# Patient Record
Sex: Male | Born: 1970 | Race: White | Hispanic: No | Marital: Single | Smoking: Never smoker
Health system: Southern US, Community
[De-identification: ages and names within clinical notes are randomized; demographics above are authoritative.]

## PROBLEM LIST (undated history)

## (undated) DIAGNOSIS — I10 Essential (primary) hypertension: Secondary | ICD-10-CM

## (undated) DIAGNOSIS — N289 Disorder of kidney and ureter, unspecified: Secondary | ICD-10-CM

## (undated) DIAGNOSIS — I509 Heart failure, unspecified: Secondary | ICD-10-CM

---

## 2006-11-01 ENCOUNTER — Emergency Department (HOSPITAL_COMMUNITY): Admission: EM | Admit: 2006-11-01 | Discharge: 2006-11-02 | Payer: Self-pay | Admitting: Emergency Medicine

## 2006-11-03 ENCOUNTER — Inpatient Hospital Stay: Payer: Self-pay | Admitting: *Deleted

## 2006-12-07 ENCOUNTER — Ambulatory Visit: Payer: Self-pay | Admitting: Internal Medicine

## 2007-02-25 ENCOUNTER — Ambulatory Visit: Payer: Self-pay | Admitting: Internal Medicine

## 2007-03-28 ENCOUNTER — Ambulatory Visit: Payer: Self-pay | Admitting: Internal Medicine

## 2008-09-05 ENCOUNTER — Emergency Department: Payer: Self-pay | Admitting: Emergency Medicine

## 2009-01-05 IMAGING — CT CT CHEST W/ CM
1 series · 16 of 32 positions shown, 20 images · non-contrast
Comparison: none

REASON FOR EXAM: chest pain high d dimer   [HOSPITAL]
COMMENTS:

[Series 4: soft tissue · axial · 0.69mm/px · z∈[-356,-83]mm · 16 of 99 slices shown, 20 images]
[im 4/99  mediastinal]
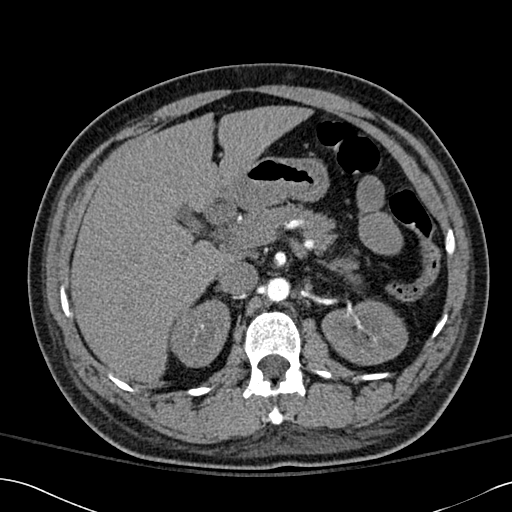
[im 4/99  lung]
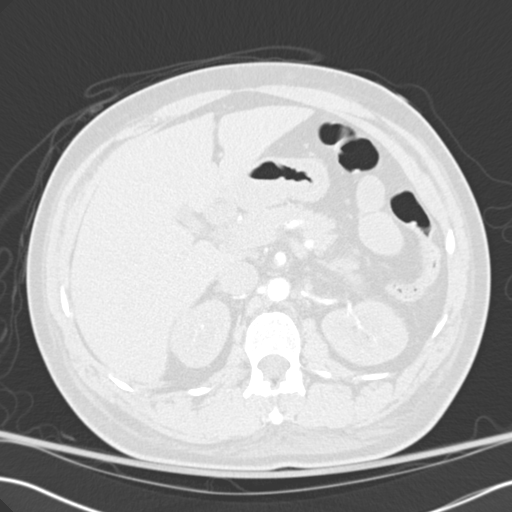
[im 11/99  lung]
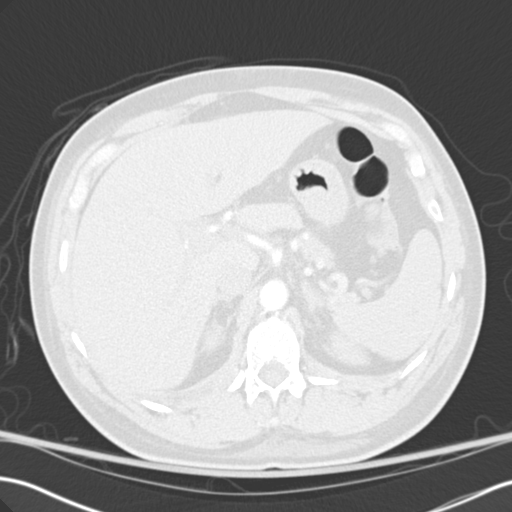
[im 19/99  lung]
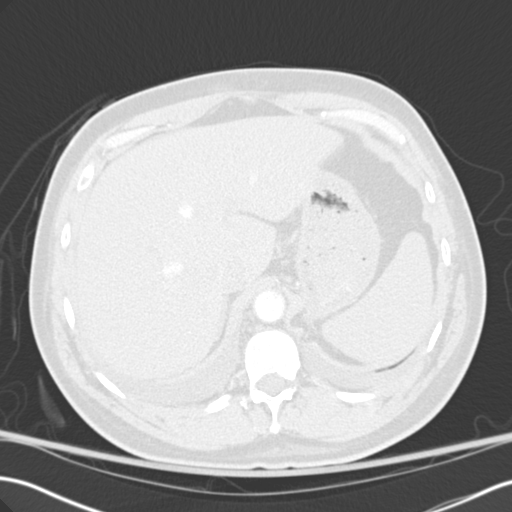
[im 22/99  lung]
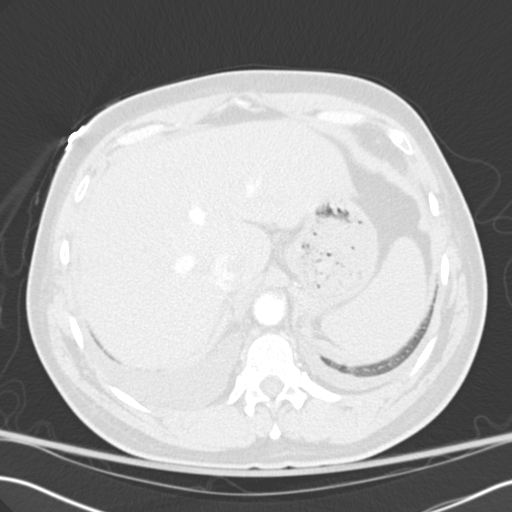
[im 30/99  mediastinal]
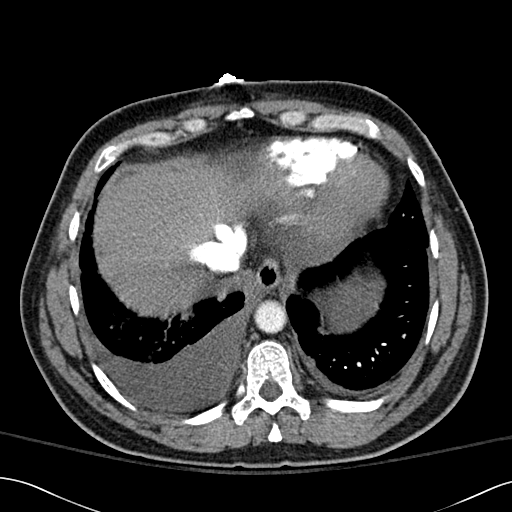
[im 30/99  lung]
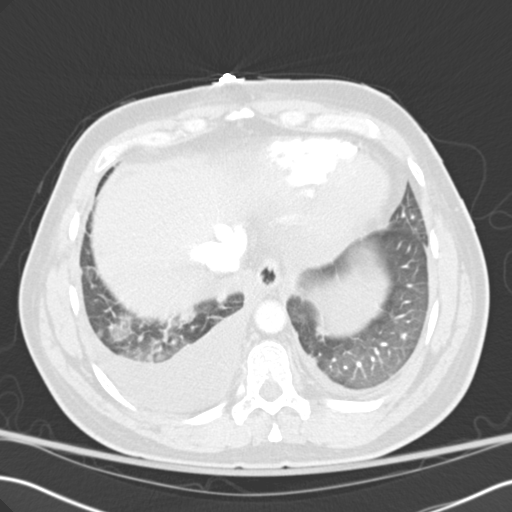
[im 37/99  lung]
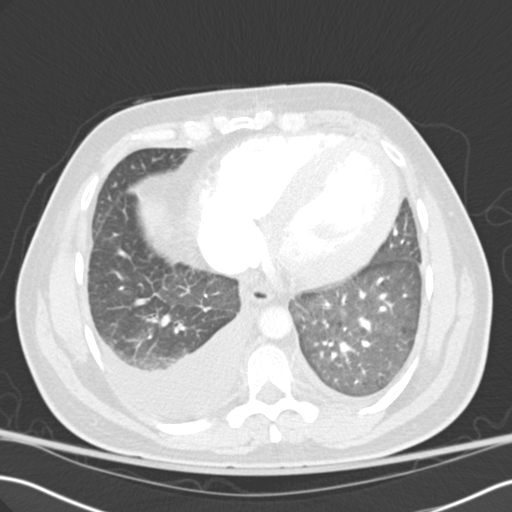
[im 44/99  lung]
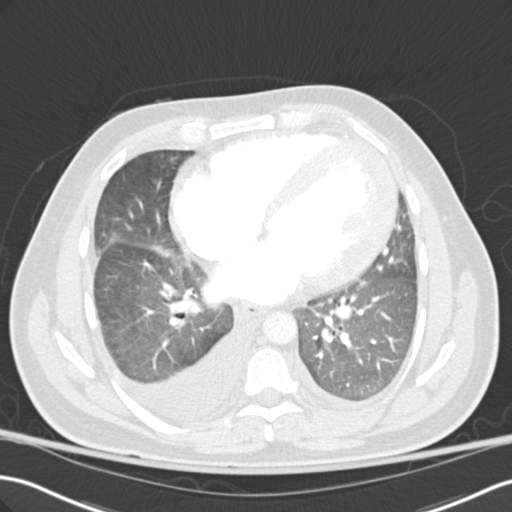
[im 48/99  lung]
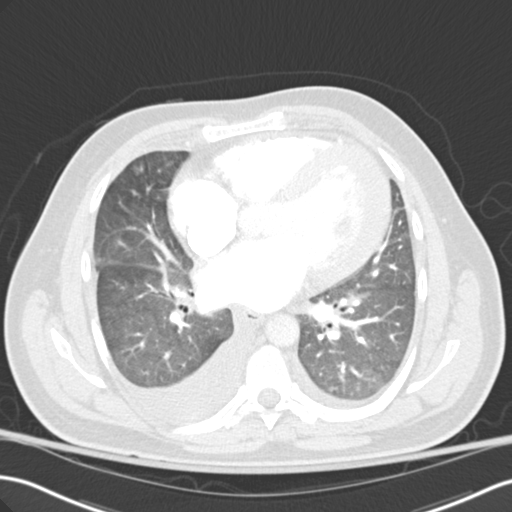
[im 51/99  mediastinal]
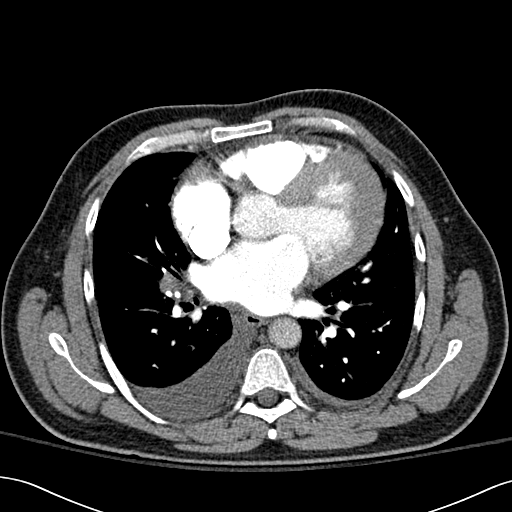
[im 51/99  lung]
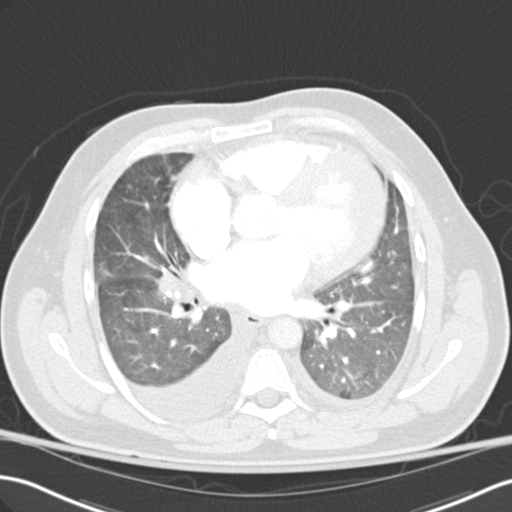
[im 55/99  lung]
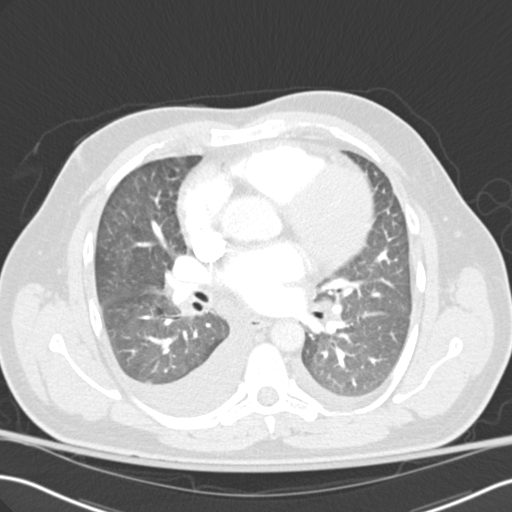
[im 62/99  lung]
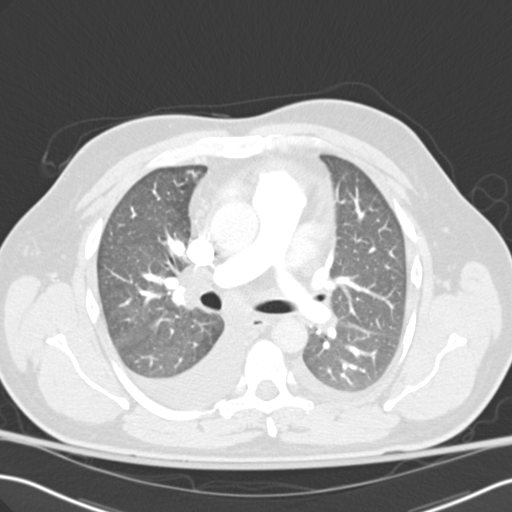
[im 69/99  lung]
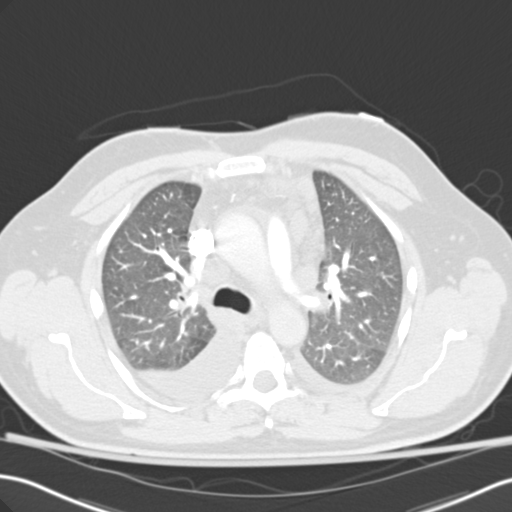
[im 77/99  mediastinal]
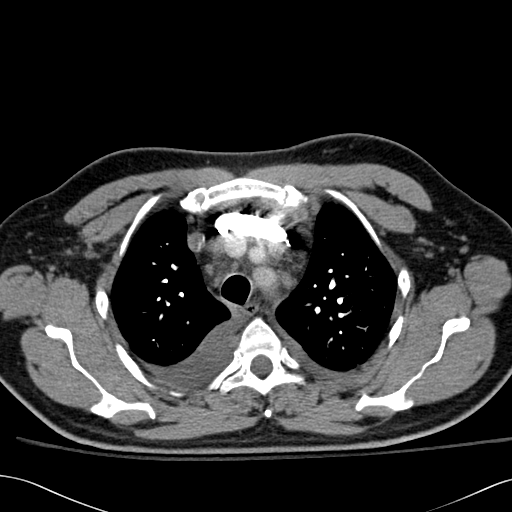
[im 77/99  lung]
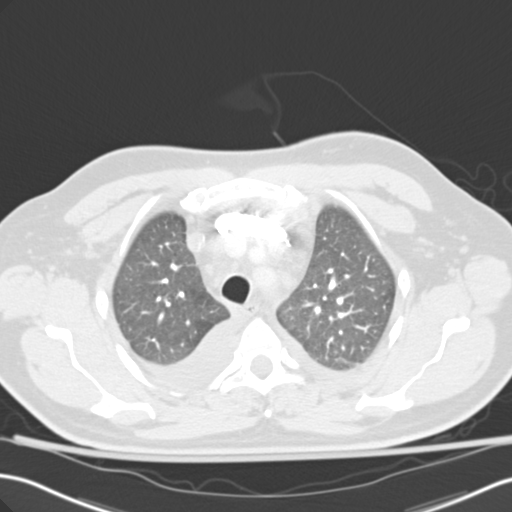
[im 80/99  lung]
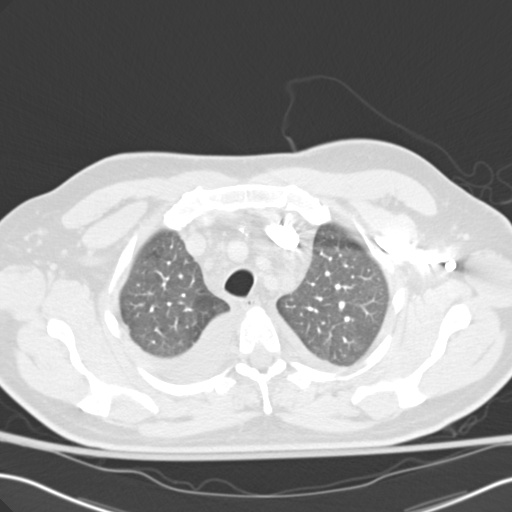
[im 88/99  lung]
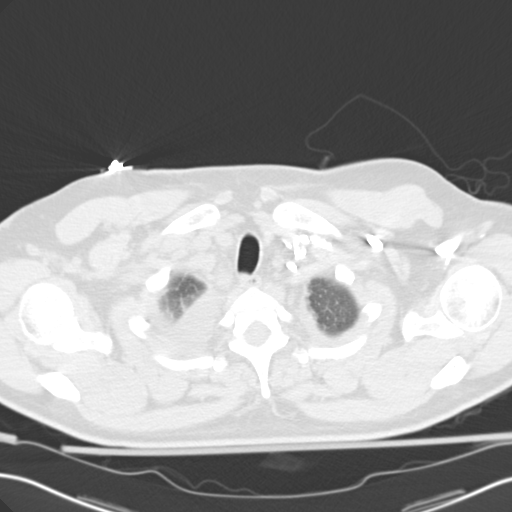
[im 95/99  lung]
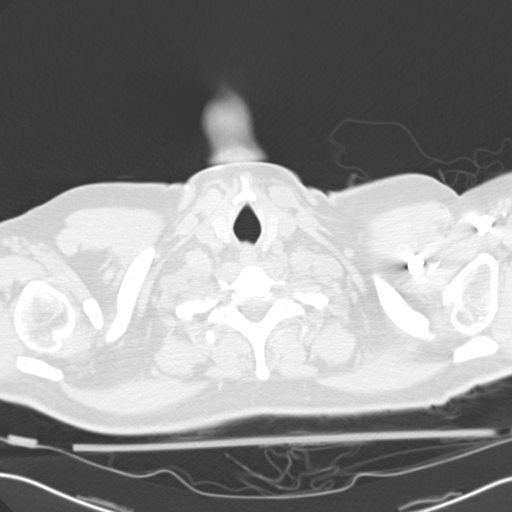

[16 of 32 positions shown; findings below may reference images not displayed]

PROCEDURE:     CT  - CT CHEST (FOR PE) W  - November 03, 2006 [DATE]

RESULT:     An enhanced emergent CT was performed for chest pain and
elevated D.dimer.  The report was faxed to the Emergency Room.

There is a good bolus of contrast in the pulmonary arteries with no filling
defects to suggest pulmonary emboli.

Bilateral pleural effusions are noted greater on the RIGHT than the LEFT.

On the mediastinal window settings a multitude of lymph nodes are noted in
the upper mediastinum pretracheal area as well as bilateral hilar lymph
nodes. On the lung window settings the lung fields appear clear.
IMPRESSION: 1)No evidence of pulmonary embolus.

2)Bilateral effusions with mediastinal and hilar lymphadenopathy. Underlying
neoplasm and lymphoma cannot be excluded.

## 2009-12-28 ENCOUNTER — Ambulatory Visit: Payer: Self-pay | Admitting: Nephrology

## 2010-11-08 IMAGING — US US PELVIS LIMITED
1 series · 17 of 25 positions shown · non-contrast
Comparison: No comparisons

REASON FOR EXAM: pain and urinary retention history of hypospadius
COMMENTS:   LMP: (Male)

PROCEDURE:     US  - US TESTICULAR  - September 05, 2008  [DATE]
RESULT:     Indication: Pain
TECHNIQUE: Multiple gray-scale, color-flow Doppler, and spectral waveform
tracings of the testicles and testicular vasculature are presented for
review.

[Series 1: us pelvis limited · 17 of 37 slices shown]
[im 1/37]
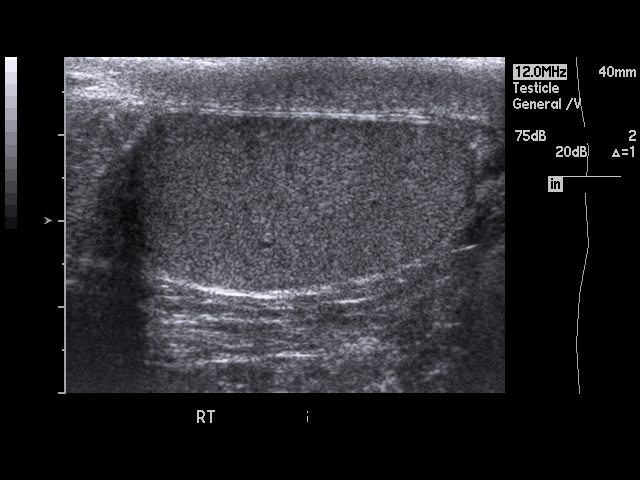
[im 4/37]
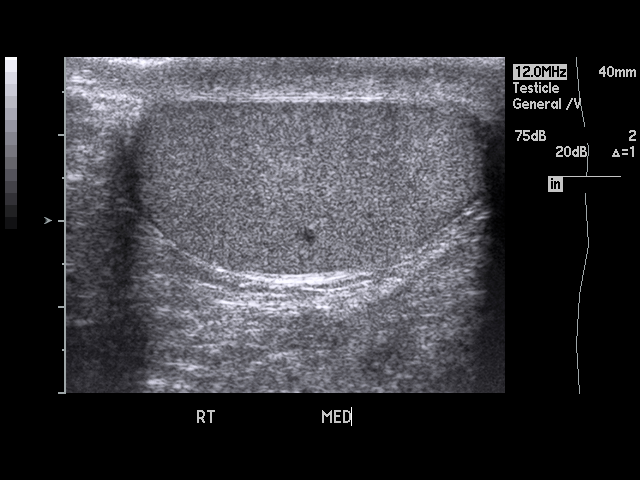
[im 5/37]
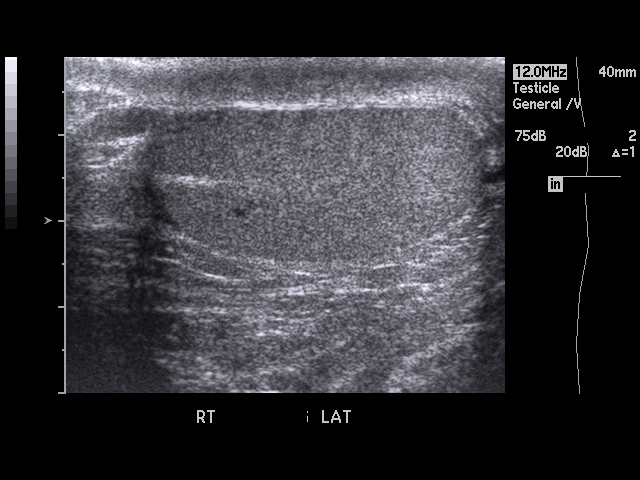
[im 8/37]
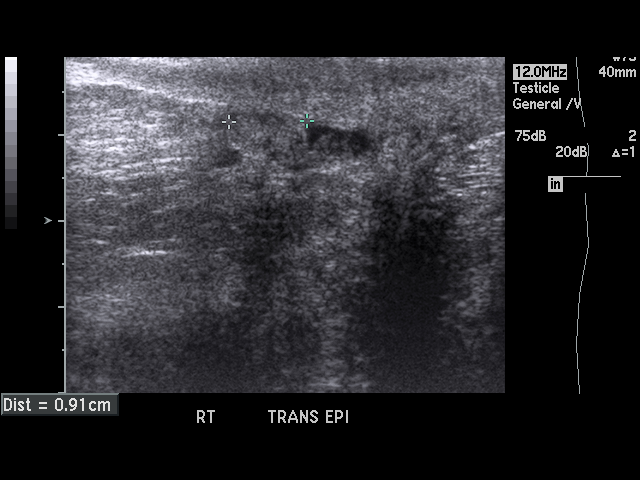
[im 10/37]
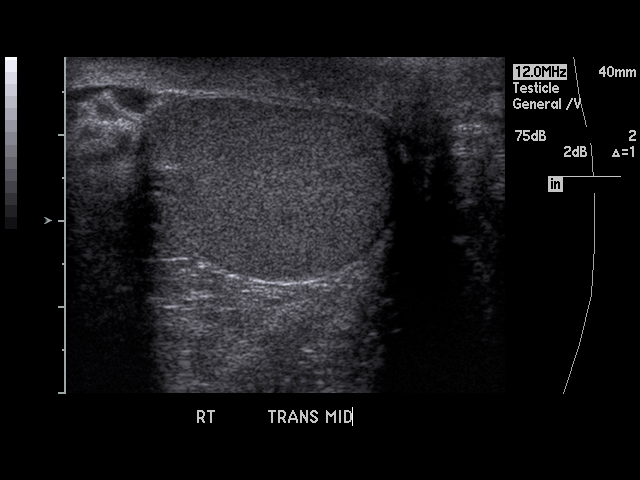
[im 13/37]
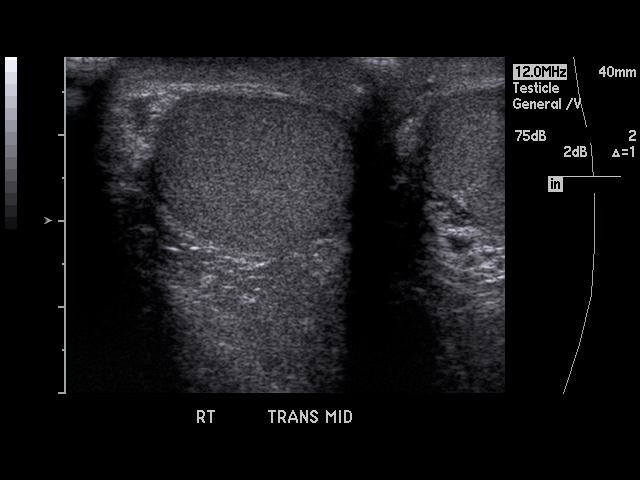
[im 14/37]
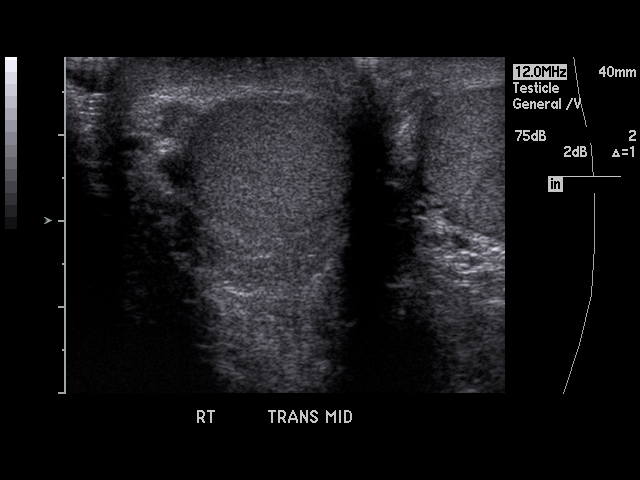
[im 17/37]
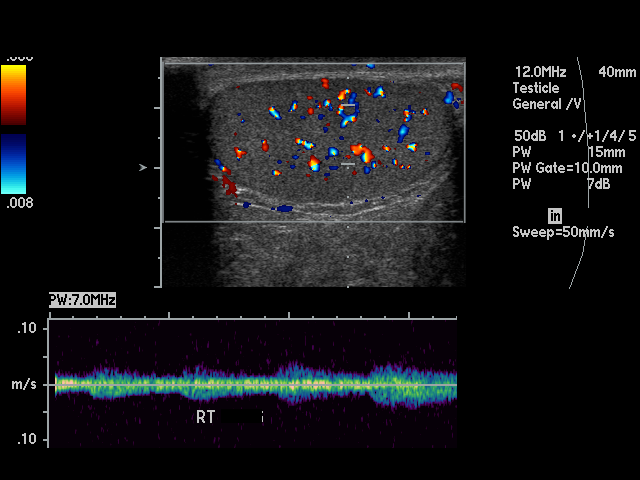
[im 19/37]
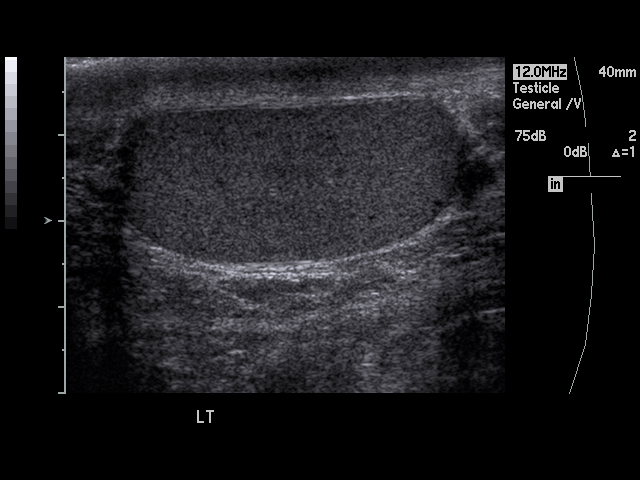
[im 20/37]
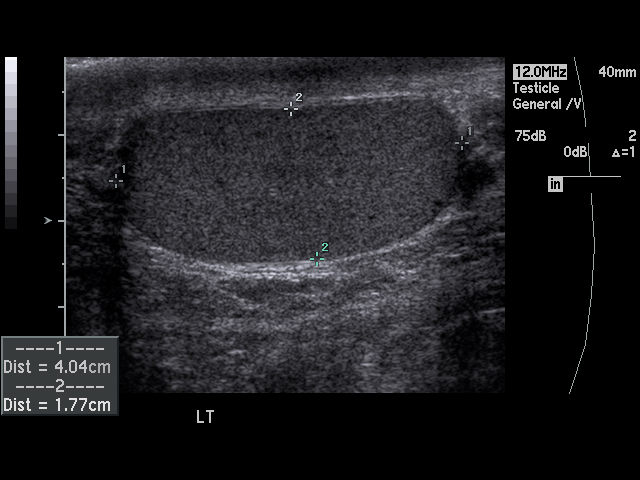
[im 23/37]
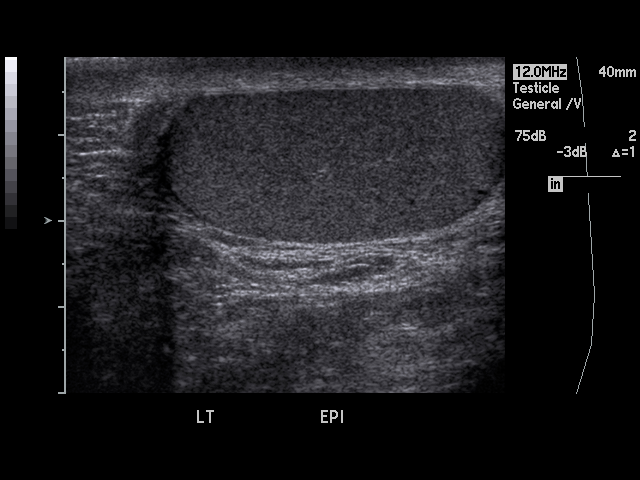
[im 25/37]
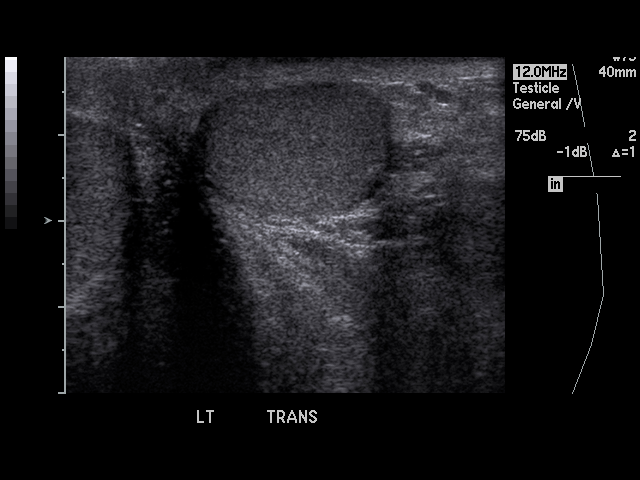
[im 28/37]
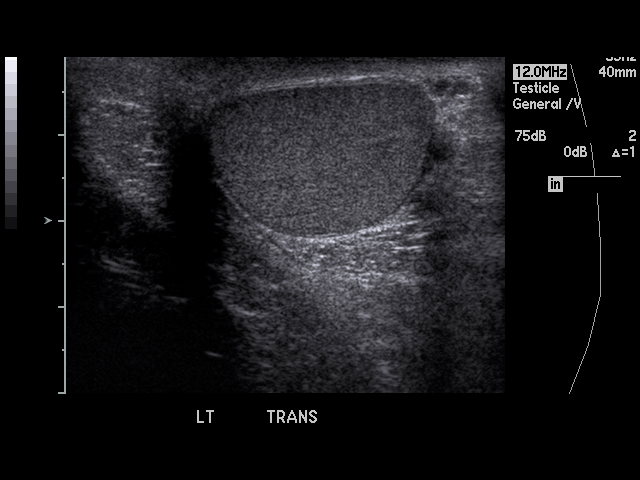
[im 29/37]
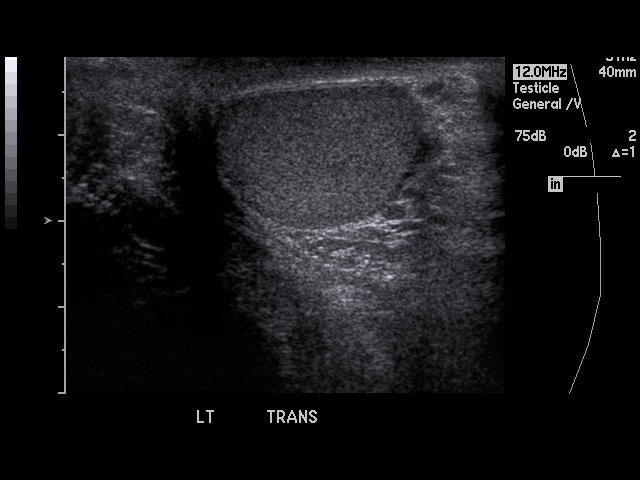
[im 32/37]
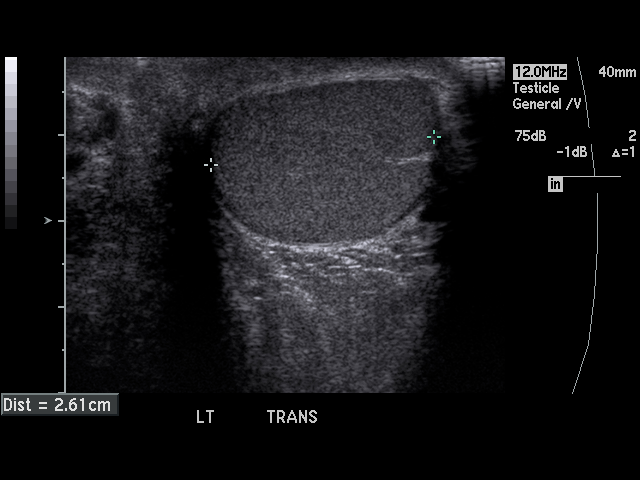
[im 34/37]
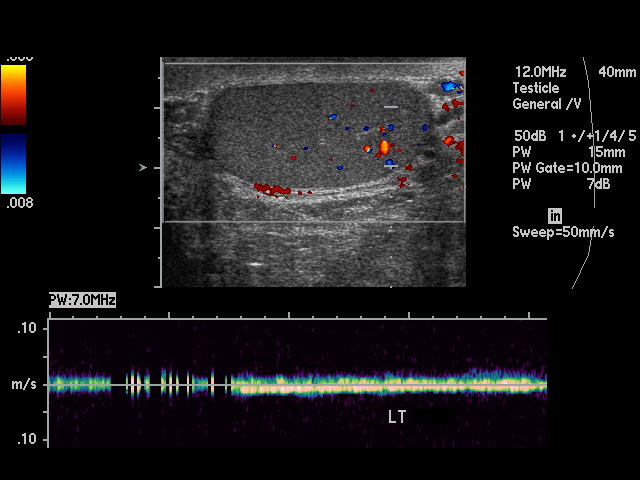
[im 37/37]
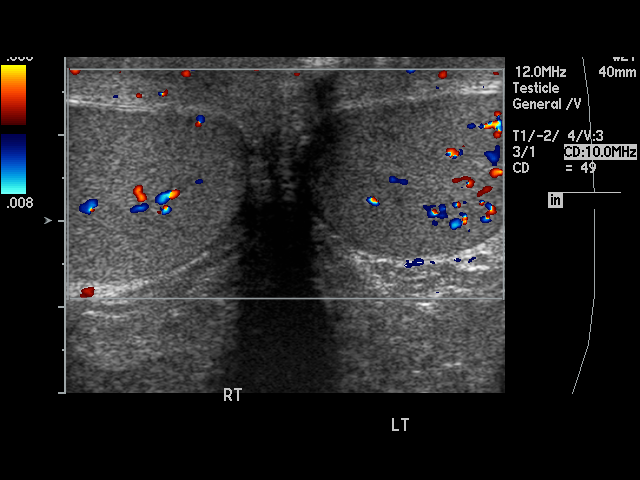

[17 of 25 positions shown; findings below may reference images not displayed]

FINDINGS: The right testicle is of normal echogenicity and measures 4.1 x 2 x 2.8 cm.
No focal mass lesions. There is normal arterial and venous blood flow. The
right epididymal head measures 0.9 cm and is normal in appearance. No right
varicocele or significant hydrocele.

The left testicle is of normal echogenicity and measures 4 x 1.8 x 2.6 cm.
No focal mass lesions. There is normal arterial and venous blood flow.  The
left epididymal head measures 0.9 cm and is normal in appearance. No left
varicocele or significant hydrocele.

Normal arterial and venous waveforms are demonstrated bilaterally.
IMPRESSION: No evidence of active testicular torsion.

## 2020-04-25 ENCOUNTER — Other Ambulatory Visit: Payer: Self-pay

## 2020-04-25 ENCOUNTER — Emergency Department (HOSPITAL_COMMUNITY)
Admission: EM | Admit: 2020-04-25 | Discharge: 2020-04-25 | Disposition: A | Discharge status: Home / Self Care | Payer: 59 | Attending: Emergency Medicine | Admitting: Emergency Medicine

## 2020-04-25 ENCOUNTER — Encounter (HOSPITAL_COMMUNITY): Payer: Self-pay

## 2020-04-25 DIAGNOSIS — T679XXA Effect of heat and light, unspecified, initial encounter: Secondary | ICD-10-CM | POA: Diagnosis not present

## 2020-04-25 DIAGNOSIS — Z79899 Other long term (current) drug therapy: Secondary | ICD-10-CM | POA: Insufficient documentation

## 2020-04-25 DIAGNOSIS — R197 Diarrhea, unspecified: Secondary | ICD-10-CM | POA: Insufficient documentation

## 2020-04-25 DIAGNOSIS — E860 Dehydration: Secondary | ICD-10-CM | POA: Diagnosis not present

## 2020-04-25 DIAGNOSIS — I951 Orthostatic hypotension: Secondary | ICD-10-CM | POA: Insufficient documentation

## 2020-04-25 DIAGNOSIS — R109 Unspecified abdominal pain: Secondary | ICD-10-CM | POA: Diagnosis not present

## 2020-04-25 DIAGNOSIS — I110 Hypertensive heart disease with heart failure: Secondary | ICD-10-CM | POA: Insufficient documentation

## 2020-04-25 DIAGNOSIS — I509 Heart failure, unspecified: Secondary | ICD-10-CM | POA: Insufficient documentation

## 2020-04-25 DIAGNOSIS — N179 Acute kidney failure, unspecified: Secondary | ICD-10-CM | POA: Diagnosis not present

## 2020-04-25 DIAGNOSIS — R55 Syncope and collapse: Secondary | ICD-10-CM | POA: Diagnosis present

## 2020-04-25 HISTORY — DX: Disorder of kidney and ureter, unspecified: N28.9

## 2020-04-25 HISTORY — DX: Essential (primary) hypertension: I10

## 2020-04-25 HISTORY — DX: Heart failure, unspecified: I50.9

## 2020-04-25 LAB — CBC WITH DIFFERENTIAL/PLATELET
Abs Immature Granulocytes: 0.02 10*3/uL (ref 0.00–0.07)
Basophils Absolute: 0 10*3/uL (ref 0.0–0.1)
Basophils Relative: 0 %
Eosinophils Absolute: 0.2 10*3/uL (ref 0.0–0.5)
Eosinophils Relative: 1 %
HCT: 39 % (ref 39.0–52.0)
Hemoglobin: 13.4 g/dL (ref 13.0–17.0)
Immature Granulocytes: 0 %
Lymphocytes Relative: 21 %
Lymphs Abs: 2.3 10*3/uL (ref 0.7–4.0)
MCH: 30.8 pg (ref 26.0–34.0)
MCHC: 34.4 g/dL (ref 30.0–36.0)
MCV: 89.7 fL (ref 80.0–100.0)
Monocytes Absolute: 1.1 10*3/uL — ABNORMAL HIGH (ref 0.1–1.0)
Monocytes Relative: 10 %
Neutro Abs: 7.2 10*3/uL (ref 1.7–7.7)
Neutrophils Relative %: 68 %
Platelets: 212 10*3/uL (ref 150–400)
RBC: 4.35 MIL/uL (ref 4.22–5.81)
RDW: 12.7 % (ref 11.5–15.5)
WBC: 10.8 10*3/uL — ABNORMAL HIGH (ref 4.0–10.5)
nRBC: 0 % (ref 0.0–0.2)

## 2020-04-25 LAB — URINALYSIS, ROUTINE W REFLEX MICROSCOPIC
Bacteria, UA: NONE SEEN
Bilirubin Urine: NEGATIVE
Glucose, UA: NEGATIVE mg/dL
Hgb urine dipstick: NEGATIVE
Ketones, ur: NEGATIVE mg/dL
Leukocytes,Ua: NEGATIVE
Nitrite: NEGATIVE
Protein, ur: 30 mg/dL — AB
Specific Gravity, Urine: 1.017 (ref 1.005–1.030)
pH: 5 (ref 5.0–8.0)

## 2020-04-25 LAB — BASIC METABOLIC PANEL
Anion gap: 8 (ref 5–15)
Anion gap: 9 (ref 5–15)
BUN: 28 mg/dL — ABNORMAL HIGH (ref 6–20)
BUN: 38 mg/dL — ABNORMAL HIGH (ref 6–20)
CO2: 15 mmol/L — ABNORMAL LOW (ref 22–32)
CO2: 26 mmol/L (ref 22–32)
Calcium: 6.5 mg/dL — ABNORMAL LOW (ref 8.9–10.3)
Calcium: 8.8 mg/dL — ABNORMAL LOW (ref 8.9–10.3)
Chloride: 103 mmol/L (ref 98–111)
Chloride: 115 mmol/L — ABNORMAL HIGH (ref 98–111)
Creatinine, Ser: 1.49 mg/dL — ABNORMAL HIGH (ref 0.61–1.24)
Creatinine, Ser: 2.56 mg/dL — ABNORMAL HIGH (ref 0.61–1.24)
GFR calc Af Amer: 33 mL/min — ABNORMAL LOW (ref >60)
GFR calc Af Amer: >60 mL/min (ref >60)
GFR calc non Af Amer: 28 mL/min — ABNORMAL LOW (ref >60)
GFR calc non Af Amer: 55 mL/min — ABNORMAL LOW (ref >60)
Glucose, Bld: 111 mg/dL — ABNORMAL HIGH (ref 70–99)
Glucose, Bld: 80 mg/dL (ref 70–99)
Potassium: 3.6 mmol/L (ref 3.5–5.1)
Potassium: 3.9 mmol/L (ref 3.5–5.1)
Sodium: 137 mmol/L (ref 135–145)
Sodium: 139 mmol/L (ref 135–145)

## 2020-04-25 MED ORDER — SODIUM CHLORIDE 0.9 % IV BOLUS
1000.0000 mL | Freq: Once | INTRAVENOUS | Status: AC
  Administered 2020-04-25: 1000 mL via INTRAVENOUS

## 2020-04-25 MED ORDER — SODIUM CHLORIDE 0.9% FLUSH: 3.0000 mL | Freq: Once | INTRAVENOUS | Status: DC

## 2020-04-25 NOTE — ED Triage Notes (Signed)
Patient state he got up to go to the bathroom and fell down twice, doesn't remember falling but remembers being on the floor, Has been out in the hot today moving to a new home, also switching to new job, initail pressure by EMS on seen was 64/30, states takes lisinopril and coreg 2x daily, was given 1L of fluid in route, arrives AOx4

## 2020-04-25 NOTE — ED Provider Notes (Signed)
[facility]-EMERGENCY DEPT Provider Note: Christopher Dell, MD, FACEP  CSN: [id number] MRN: [id number] ARRIVAL: 04/25/20 at 0008 ROOM: [facility]   CHIEF COMPLAINT  Hypotension and Near Syncope   HISTORY OF PRESENT ILLNESS  04/25/20 12:42 AM Christopher Morales is a 49 y.o. male with a remote history of CHF (circa 2008) with an ejection fraction of 20 that subsequently improved and is now 50 to 55%.  He is not on Lasix and only takes lisinopril and Coreg for hypertension.  He has been under increased stress the past several days being involved in moving.  He was also out in the hot sun yesterday.  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  He was feeling tired and weak yesterday evening and sat down around 10:30 PM.  He got up to have a bowel movement and after the bowel movement as he was walking back from the bathroom he had a syncopal episode which he does not remember.  His mother told him he actually had 2 syncopal episodes.  He did not injure himself in either of these episodes.  He did have abdominal cramping and diarrhea but no nausea or vomiting.  His mother called EMS and they found him to have an initial BP of 64/30.  He was given 1 L of normal saline prior to arrival.  His blood pressure on arrival was 87/62 and his systolic blood pressure currently is 102.  He denies any pain or shortness of breath.  He feels back to normal after the fluid bolus.   Past Medical History:  Diagnosis Date   CHF (congestive heart failure) (HCC)    Hypertension    Renal disorder     History reviewed. No pertinent surgical history.  No family history on file.  Social History   Tobacco Use   Smoking status: Never Smoker   Smokeless tobacco: Never Used  Substance Use Topics   Alcohol use: Never   Drug use: Never    Prior to Admission medications   Medication Sig Start Date End Date Taking? Authorizing Provider  carvedilol (COREG) 12.5 MG tablet Take 12.5 mg by mouth in the morning and at  bedtime. 07/30/16  Yes [provider]  lisinopril (ZESTRIL) 20 MG tablet Take 1 tablet by mouth daily. 08/25/16  Yes [provider]  tamsulosin (FLOMAX) 0.4 MG CAPS capsule Take 0.4 mg by mouth daily.   Yes [provider]    Allergies Patient has no known allergies.   REVIEW OF SYSTEMS  Negative except as noted here or in the History of Present Illness.   PHYSICAL EXAMINATION  Initial Vital Signs Blood pressure (!) 87/62, pulse 80, temperature 97.9 F (36.6 C), temperature source Oral, resp. rate 17, height 5\' 5"  (1.651 m), weight 79.4 kg, SpO2 97 %.  Examination General: Well-developed, well-nourished male in no acute distress; appearance consistent with age of record HENT: normocephalic; atraumatic Eyes: pupils equal, round and reactive to light; extraocular muscles intact Neck: supple Heart: regular rate and rhythm Lungs: clear to auscultation bilaterally Abdomen: soft; nondistended; nontender; bowel sounds present Extremities: No deformity; full range of motion; pulses normal Neurologic: Awake, alert and oriented; motor function intact in all extremities and symmetric; no facial droop Skin: Warm and dry Psychiatric: Normal mood and affect   RESULTS  Summary of this visit's results, reviewed and interpreted by myself:   Date: 04/25/2020 12:41 AM  Rate: 73  Rhythm: normal sinus rhythm  QRS Axis: normal  Intervals: normal  ST/T  Wave abnormalities: normal  Conduction Disutrbances: none  Narrative Interpretation: unremarkable  Comparison with previous EKG: none available   Laboratory Studies: Results for orders placed or performed during the hospital encounter of 04/25/20 (from the past 24 hour(s))  Basic metabolic panel     Status: Abnormal   Collection Time: 04/25/20 12:39 AM  Result Value Ref Range   Sodium 137 135 - 145 mmol/L   Potassium 3.9 3.5 - 5.1 mmol/L   Chloride 103 98 - 111 mmol/L   CO2 26 22 - 32 mmol/L   Glucose, Bld  111 (H) 70 - 99 mg/dL   BUN 38 (H) 6 - 20 mg/dL   Creatinine, Ser 8.29 (H) 0.61 - 1.24 mg/dL   Calcium 8.8 (L) 8.9 - 10.3 mg/dL   GFR calc non Af Amer 28 (L) >60 mL/min   GFR calc Af Amer 33 (L) >60 mL/min   Anion gap 8 5 - 15  CBC with Differential/Platelet     Status: Abnormal   Collection Time: 04/25/20 12:39 AM  Result Value Ref Range   WBC 10.8 (H) 4.0 - 10.5 K/uL   RBC 4.35 4.22 - 5.81 MIL/uL   Hemoglobin 13.4 13.0 - 17.0 g/dL   HCT 93.7 39 - 52 %   MCV 89.7 80.0 - 100.0 fL   MCH 30.8 26.0 - 34.0 pg   MCHC 34.4 30.0 - 36.0 g/dL   RDW 16.9 67.8 - 93.8 %   Platelets 212 150 - 400 K/uL   nRBC 0.0 0.0 - 0.2 %   Neutrophils Relative % 68 %   Neutro Abs 7.2 1.7 - 7.7 K/uL   Lymphocytes Relative 21 %   Lymphs Abs 2.3 0.7 - 4.0 K/uL   Monocytes Relative 10 %   Monocytes Absolute 1.1 (H) 0 - 1 K/uL   Eosinophils Relative 1 %   Eosinophils Absolute 0.2 0 - 0 K/uL   Basophils Relative 0 %   Basophils Absolute 0.0 0 - 0 K/uL   Immature Granulocytes 0 %   Abs Immature Granulocytes 0.02 0.00 - 0.07 K/uL  Urinalysis, Routine w reflex microscopic     Status: Abnormal   Collection Time: 04/25/20  1:13 AM  Result Value Ref Range   Color, Urine YELLOW YELLOW   APPearance CLEAR CLEAR   Specific Gravity, Urine 1.017 1.005 - 1.030   pH 5.0 5.0 - 8.0   Glucose, UA NEGATIVE NEGATIVE mg/dL   Hgb urine dipstick NEGATIVE NEGATIVE   Bilirubin Urine NEGATIVE NEGATIVE   Ketones, ur NEGATIVE NEGATIVE mg/dL   Protein, ur 30 (A) NEGATIVE mg/dL   Nitrite NEGATIVE NEGATIVE   Leukocytes,Ua NEGATIVE NEGATIVE   RBC / HPF 0-5 0 - 5 RBC/hpf   WBC, UA 0-5 0 - 5 WBC/hpf   Bacteria, UA NONE SEEN NONE SEEN   Squamous Epithelial / LPF 0-5 0 - 5   Mucus PRESENT    Hyaline Casts, UA PRESENT   Basic metabolic panel     Status: Abnormal   Collection Time: 04/25/20  4:35 AM  Result Value Ref Range   Sodium 139 135 - 145 mmol/L   Potassium 3.6 3.5 - 5.1 mmol/L   Chloride 115 (H) 98 - 111 mmol/L   CO2 15  (L) 22 - 32 mmol/L   Glucose, Bld 80 70 - 99 mg/dL   BUN 28 (H) 6 - 20 mg/dL   Creatinine, Ser 1.01 (H) 0.61 - 1.24 mg/dL   Calcium 6.5 (L) 8.9 - 10.3 mg/dL  GFR calc non Af Amer 55 (L) >60 mL/min   GFR calc Af Amer >60 >60 mL/min   Anion gap 9 5 - 15   Imaging Studies: No results found.  ED COURSE and MDM  Nursing notes, initial and subsequent vitals signs, including pulse oximetry, reviewed and interpreted by myself.  Vitals:   04/25/20 0100 04/25/20 0200 04/25/20 0300 04/25/20 0400  BP: (!) 100/39 107/66 110/79 115/79  Pulse: (!) 102 75 90 81  Resp: 15 15 (!) 21 15  Temp:      TempSrc:      SpO2: 100% 96% 99% 99%  Weight:      Height:       Medications  sodium chloride flush (NS) 0.9 % injection 3 mL (3 mLs Intravenous Not Given 04/25/20 0537)  sodium chloride 0.9 % bolus 1,000 mL (0 mLs Intravenous Stopped 04/25/20 0440)   2:07 AM BUN and creatinine here are 38 and 2.56, respectively.  02/21/2020 at [facility] they were 20 and 1.67, respectively.  We will give an additional liter of fluid and recheck.   5:36 AM BUN and creatinine have improved to 28 and 1.49, respectively after IV fluid bolus.  Patient feeling better and blood pressure has improved as well.  I suspect he was not keeping himself adequately hydrated while working in the heat recently.  He was advised to stay well-hydrated and to avoid excessive heat.  PROCEDURES  Procedures   ED DIAGNOSES     ICD-10-CM   1. Syncope due to orthostatic hypotension  I95.1   2. Heat exposure, initial encounter  T67.9XXA   3. Dehydration  E86.0   4. AKI (acute kidney injury) (HCC)  N17.9        Kiven Vangilder, MD 04/25/20 [phone]

## 2022-08-15 ENCOUNTER — Ambulatory Visit: Payer: 59 | Attending: Cardiovascular Disease | Admitting: Cardiovascular Disease

## 2022-08-15 VITALS — BP 118/72 | HR 75 | Ht 65.0 in | Wt 162.6 lb

## 2022-08-15 DIAGNOSIS — I255 Ischemic cardiomyopathy: Secondary | ICD-10-CM | POA: Diagnosis not present

## 2022-08-15 DIAGNOSIS — G4734 Idiopathic sleep related nonobstructive alveolar hypoventilation: Secondary | ICD-10-CM | POA: Diagnosis not present

## 2022-08-15 DIAGNOSIS — R011 Cardiac murmur, unspecified: Secondary | ICD-10-CM

## 2022-08-15 DIAGNOSIS — I38 Endocarditis, valve unspecified: Secondary | ICD-10-CM

## 2022-08-15 DIAGNOSIS — G4733 Obstructive sleep apnea (adult) (pediatric): Secondary | ICD-10-CM

## 2022-08-15 DIAGNOSIS — R600 Localized edema: Secondary | ICD-10-CM

## 2022-08-15 DIAGNOSIS — I5042 Chronic combined systolic (congestive) and diastolic (congestive) heart failure: Secondary | ICD-10-CM | POA: Diagnosis not present

## 2022-08-15 NOTE — Patient Instructions (Signed)
Medication Instructions:  The current medical regimen is effective;  continue present plan and medications as directed. Please refer to the Current Medication list given to you today.  *If you need a refill on your cardiac medications before your next appointment, please call your pharmacy*   Lab Work: NONE  Testing/Procedures: Echocardiogram - Your physician has requested that you have an echocardiogram. Echocardiography is a painless test that uses sound waves to create images of your heart. It provides your doctor with information about the size and shape of your heart and how well your heart's chambers and valves are working. This procedure takes approximately one hour. There are no restrictions for this procedure.    Your physician has recommended that you have a sleep study. This test records several body functions during sleep, including: brain activity, eye movement, oxygen and carbon dioxide blood levels, heart rate and rhythm, breathing rate and rhythm, the flow of air through your mouth and nose, snoring, body muscle movements, and chest and belly movement.  SOMEONE WILL BE CALLING YOU TO SCHEDULE THIS.  Follow-Up: At [facility], you and your health needs are our priority.  As part of our continuing mission to provide you with exceptional heart care, we have created designated Provider Care Teams.  These Care Teams include your primary Cardiologist (physician) and Advanced Practice Providers (APPs -  Physician Assistants and Nurse Practitioners) who all work together to provide you with the care you need, when you need it.  We recommend signing up for the patient portal called "MyChart".  Sign up information is provided on this After Visit Summary.  MyChart is used to connect with patients for Virtual Visits (Telemedicine).  Patients are able to view lab/test results, encounter notes, upcoming appointments, etc.  Non-urgent messages can be sent to your provider as well.   To  learn more about what you can do with MyChart, go to [email or web address].    Your next appointment:   AFTER TESTING   The format for your next appointment:   In Person  Provider:   DR [place] [facility]     Other Instructions   Important Information About Sugar

## 2022-08-15 NOTE — Progress Notes (Signed)
Cardiology Office Note    Date:  08/22/2022   ID:  Christopher Morales, DOB September 06, 1971, MRN [id number]  PCP:  Leotis Pain, MD  Cardiologist:  Shelva Majestic, MD   New sleep consult referred by Deatra Ina, MD at [facility]   History of Present Illness:  Christopher Morales is a 51 y.o. male who is followed at [facility] and recently saw Dr. Dagoberto Ligas, IV on July 02, 2022.  He has significant cardiac history and remotely had seen Dr. Ubaldo Glassing and now sees Dr.Paraschos at the [facility] in [place].  He has documented chronic systolic heart failure and now presents for sleep evaluation in [place] rather than [place].  However, the patient has a history of prior MI and had been living in [place].  He has a history of severe LV dysfunction with heart failure in 2008 and apparently EF at that time was 20% which subsequently improved to greater than 55%.  He suffered a stroke on January 04, 2022 and subsequently had an MI complicated by ventricular tachycardia.  He underwent cardiac catheterization on January 27, 2022 which showed three-vessel obstructive disease with chronically occluded LAD, chronically occluded RCA, and 90% stenosis in the left circumflex vessel with collaterals to the distal LAD with EF 45 to 50%.  He was turned down for CABG revascularization due to a recent CVA and had undergone PCI on January 29, 2022 with DES stenting to his circumflex vessel.  He subsequently has had issues with acute on chronic heart failure.  He tells me his medications had changed after his stroke and he is no longer taking lisinopril or carvedilol but has been on amiodarone 200 mg daily, furosemide 20 mg which he takes 3 times per week, isosorbide 30 mg daily, and is on aspirin/ticagrelor following his stent.  He is on pantoprazole for GERD.  He has had issues with sleep and apparently sleeps with supplemental oxygen at 1 to 2 L.  Presently  he admits to snoring, nocturia at least 2 times per night, awakening gasping for breath, as well as occasionally talks in his sleep.  He typically goes to bed between 11 PM and midnight and often wakes up at 9 AM.  He previously had had a sleep study at [facility] in April.  He apparently was told of having sleep apnea but he has not yet seen a sleep physician and instead of being seen by the neurologist at [facility] he is now referred for evaluation.  An Epworth sleepiness score today calculated at 6 arguing against significant daytime sleepiness.   Past Medical History:  Diagnosis Date   CHF (congestive heart failure) (HCC)    Hypertension    Renal disorder     No past surgical history on file.  Current Medications: Outpatient Medications Prior to Visit  Medication Sig Dispense Refill   acetaminophen (TYLENOL) 325 MG tablet Take by mouth.     amiodarone (PACERONE) 200 MG tablet Take 200 mg by mouth daily.     aspirin EC 81 MG tablet Take 81 mg by mouth daily.     atorvastatin (LIPITOR) 80 MG tablet Take 80 mg by mouth daily.     clonazePAM (KLONOPIN) 0.5 MG tablet Take by mouth.     cyanocobalamin (VITAMIN B12) 1000 MCG tablet Take by mouth.     ferrous sulfate 325 (65 FE) MG tablet Take 325 mg by mouth once a week.  furosemide (LASIX) 20 MG tablet Take 20 mg by mouth daily.     isosorbide mononitrate (IMDUR) 30 MG 24 hr tablet Take 30 mg by mouth daily.     Lactobacillus Rhamnosus, GG, (CULTURELLE) CAPS Take 1 capsule by mouth daily.     metoprolol succinate (TOPROL-XL) 25 MG 24 hr tablet Take by mouth.     nitroGLYCERIN (NITROSTAT) 0.4 MG SL tablet Place under the tongue.     nystatin cream (MYCOSTATIN) Apply topically 2 (two) times daily.     pantoprazole (PROTONIX) 40 MG tablet Take by mouth.     tamsulosin (FLOMAX) 0.4 MG CAPS capsule Take 0.4 mg by mouth daily.     ticagrelor (BRILINTA) 90 MG TABS tablet Take 1 tablet by mouth 2 (two) times daily.     triamcinolone cream (KENALOG)  0.1 % Apply topically.     carvedilol (COREG) 12.5 MG tablet Take 12.5 mg by mouth in the morning and at bedtime. (Patient not taking: Reported on 08/15/2022)     lisinopril (ZESTRIL) 20 MG tablet Take 1 tablet by mouth daily. (Patient not taking: Reported on 08/15/2022)     No facility-administered medications prior to visit.     Allergies:   Patient has no known allergies.   Social History   Socioeconomic History   Marital status: Single    Spouse name: Not on file   Number of children: Not on file   Years of education: Not on file   Highest education level: Not on file  Occupational History   Not on file  Tobacco Use   Smoking status: Never   Smokeless tobacco: Never  Substance and Sexual Activity   Alcohol use: Never   Drug use: Never   Sexual activity: Never  Other Topics Concern   Not on file  Social History Narrative   Not on file   Social Determinants of Health   Financial Resource Strain: Not on file  Food Insecurity: Not on file  Transportation Needs: Not on file  Physical Activity: Not on file  Stress: Not on file  Social Connections: Not on file      Family History:    ROS General: Negative; No fevers, chills, or night sweats;  HEENT: Negative; No changes in vision or hearing, sinus congestion, difficulty swallowing Pulmonary: Negative; No cough, wheezing, shortness of breath, hemoptysis Cardiovascular: See HPI GI: Negative; No nausea, vomiting, diarrhea, or abdominal pain GU: Negative; No dysuria, hematuria, or difficulty voiding Musculoskeletal: Negative; no myalgias, joint pain, or weakness Hematologic/Oncology: Negative; no easy bruising, bleeding Endocrine: Negative; no heat/cold intolerance; no diabetes Neuro: Negative; no changes in balance, headaches Skin: Negative; No rashes or skin lesions Psychiatric: Negative; No behavioral problems, depression Sleep: Positive for snoring snoring, apparently recently diagnosed sleep apnea; nodaytime  sleepiness, hypersomnolence, bruxism, restless legs, hypnogognic hallucinations, no cataplexy Other comprehensive 14 point system review is negative.   PHYSICAL EXAM:   VS:  BP 118/72 (BP Location: Left Arm, Patient Position: Sitting, Cuff Size: Normal)   Pulse 75   Ht 5\' 5"  (1.651 m)   Wt 162 lb 9.6 oz (73.8 kg)   SpO2 96%   BMI 27.06 kg/m      Wt Readings from Last 3 Encounters:  08/15/22 162 lb 9.6 oz (73.8 kg)  04/25/20 175 lb (79.4 kg)    General: Alert, oriented, no distress.  Skin: normal turgor, no rashes, warm and dry HEENT: Normocephalic, atraumatic. Pupils equal round and reactive to light; sclera anicteric; extraocular muscles intact; F Nose  without nasal septal hypertrophy Mouth/Parynx : Very large tongue; Mallinpatti scale 4 Neck: No JVD, no carotid bruits; normal carotid upstroke Lungs: clear to ausculatation and percussion; no wheezing or rales Chest wall: without tenderness to palpitation Heart: PMI not displaced, RRR, s1 s2 normal, 2/6 systolic murmur at apex, 2/6 diastolic murmur in aortic area;  no rubs, gallops, thrills, or heaves Abdomen: soft, nontender; no hepatosplenomehaly, BS+; abdominal aorta nontender and not dilated by palpation. Back: no CVA tenderness Pulses 2+ Musculoskeletal: full range of motion, normal strength, no joint deformities Extremities: Bilateral lower extremity edema 2-3+ extending to the pretibial region bilaterally no clubbing cyanosis  Homan's sign negative  Neurologic: grossly nonfocal; Cranial nerves grossly wnl Psychologic: Normal mood and affect   Studies/Labs Reviewed:   August 15, 2022 ECG (independently read by me):  Sinus rhythm at 75, 1st degree AV block, LAHB, Increased QTc at 491 msec.  Recent Labs:    Latest Ref Rng & Units 04/25/2020    4:35 AM 04/25/2020   12:39 AM  BMP  Glucose 70 - 99 mg/dL 80  098111   BUN 6 - 20 mg/dL 28  38   Creatinine 1.190.61 - 1.24 mg/dL 1.471.49  8.292.56   Sodium 562135 - 145 mmol/L 139  137    Potassium 3.5 - 5.1 mmol/L 3.6  3.9   Chloride 98 - 111 mmol/L 115  103   CO2 22 - 32 mmol/L 15  26   Calcium 8.9 - 10.3 mg/dL 6.5  8.8          No data to display             Latest Ref Rng & Units 04/25/2020   12:39 AM  CBC  WBC 4.0 - 10.5 K/uL 10.8   Hemoglobin 13.0 - 17.0 g/dL 13.013.4   Hematocrit 86.539.0 - 52.0 % 39.0   Platelets 150 - 400 K/uL 212    Lab Results  Component Value Date   MCV 89.7 04/25/2020   No results found for: "TSH" No results found for: "HGBA1C"   BNP No results found for: "BNP"  ProBNP No results found for: "PROBNP"   Lipid Panel  No results found for: "CHOL", "TRIG", "HDL", "CHOLHDL", "VLDL", "LDLCALC", "LDLDIRECT", "LABVLDL"   RADIOLOGY: No results found.   Additional studies/ records that were reviewed today include:   I have reviewed the records of Christopher [facility]ales.  I also reviewed the records of Dr. Jamse MeadAlex Paraschos   ASSESSMENT:    1. OSA (obstructive sleep apnea)   2. Chronic combined systolic and diastolic heart failure (HCC)   3. Nocturnal hypoxia   4. Ischemic cardiomyopathy   5. Diastolic murmur   6. Systolic murmur at cardiac apex   7. Bilateral lower extremity edema     PLAN:  Mr. Miquel DunnWesley Serfass is a 51 year old gentleman who has a history of CAD, myocardial infarction, VT, CVA,  LV dysfunction, and acute on chronic systolic and diastolic heart failure.  He apparently underwent a sleep study earlier this year; I do not have any records.  Presently, he admits to snoring, nocturia at least 2-3 times per night, and has awaken gasping for breath particularly when sleeping on his back.  He typically goes to bed between 11 PM and midnight and wakes up at 9 AM.  His sleep is not restorative.  He is unaware of any bruxism, restless legs, hypnopompic or hypnagogic hallucinations or cataplectic events.  I had a long discussion with him regarding sleep  apnea as well as his cardiovascular issues.  Presently,  his blood pressure is stable today.  He tells me remotely his ejection fraction had been 20% in 2008 but had improved to 55%.  Following his recent MI and recent stent to his circumflex EF reportedly was around 45 to 50%.  On exam he has significant cardiac murmur both at the apex suggesting mitral regurgitation and also diastolic murmur suggesting aortic insufficiency.  I have recommended that he be reevaluated by Dr. Darrold Junker and I suspect he will need an echo Doppler study.  He also was not on the medications which Dr. Evette Georges had thought he was on and he tells me he is no longer taking lisinopril nor carvedilol.  He may benefit from possible additional guideline directed medical therapy with Entresto or possible spironolactone but since Dr. Evette Georges is his cardiologist I will defer this to him.  I had a long discussion with him today regarding sleep apnea and its potential adverse cardiovascular consequences if left untreated.  I specifically discussed normal sleep architecture and the potential disruption created by untreated sleep apnea.  I discussed the normal response for  heart rate and blood pressure to drop as a result of the parasympathetic driven non-REM sleep and the effect of creased sympathetic activity contributed by frequent arousals and REM sleep contributing to potential blood pressure elevation and potential resistant hypertension.  With his cardiovascular history with untreated sleep apnea I discussed potential increased risk for nocturnal arrhythmias and in particular significant increased likelihood of atrial fibrillation development. He currently is on amiodarone which was placed for VT but this will also be potentially helpful in preventing atrial fibrillation.  In addition to its effects on hypertension, nocturnal arrhythmias and fibrillation I discussed untreated sleep apnea having a negative effect with reference to insulin resistance, an increase in inflammatory biomarkers, and  significant increased potential for nocturnal GERD.  With his underlying coronary artery disease and severe atherosclerosis I discussed the adverse consequences of untreated sleep apnea with reference to significant nocturnal hypoxemia contributing to both cardiovascular as well as cerebrovascular ischemia and potential for acute coronary syndrome.  He has been experiencing nocturia several times per night and I discussed the pathophysiology associated with this.  On exam he has a very large tongue and a Mallampati scale of 4 undoubtedly are major contributors to his difficulty sleeping supine and awakening gasping for breath.  I have recommended that he undergo an in lab split-night sleep study and  if he meets moderate criteria over the first 2 hours,  CPAP or potential BiPAP titration can be initiated during that evaluation.   Presently, he also has significant lower extremity edema.  He was recently found to have increasing serum creatinine and will be seeing nephrology next week.  I have recommended support stockings and dose adjustment of his diuretic regimen may be necessary.  We will try to expedite his split-night sleep study if at all possible.  I answered all his questions and we will see him in follow-up evaluation.  Time spent: 50 minutes  Medication Adjustments/Labs and Tests Ordered: Current medicines are reviewed at length with the patient today.  Concerns regarding medicines are outlined above.  Medication changes, Labs and Tests ordered today are listed in the Patient Instructions below. Patient Instructions  Medication Instructions:  The current medical regimen is effective;  continue present plan and medications as directed. Please refer to the Current Medication list given to you today.  *If you need a refill on  your cardiac medications before your next appointment, please call your pharmacy*   Lab Work: NONE  Testing/Procedures: Echocardiogram - Your physician has requested that  you have an echocardiogram. Echocardiography is a painless test that uses sound waves to create images of your heart. It provides your doctor with information about the size and shape of your heart and how well your heart's chambers and valves are working. This procedure takes approximately one hour. There are no restrictions for this procedure.    Your physician has recommended that you have a sleep study. This test records several body functions during sleep, including: brain activity, eye movement, oxygen and carbon dioxide blood levels, heart rate and rhythm, breathing rate and rhythm, the flow of air through your mouth and nose, snoring, body muscle movements, and chest and belly movement.  SOMEONE WILL BE CALLING YOU TO SCHEDULE THIS.  Follow-Up: At [facility], you and your health needs are our priority.  As part of our continuing mission to provide you with exceptional heart care, we have created designated Provider Care Teams.  These Care Teams include your primary Cardiologist (physician) and Advanced Practice Providers (APPs -  Physician Assistants and Nurse Practitioners) who all work together to provide you with the care you need, when you need it.  We recommend signing up for the patient portal called "MyChart".  Sign up information is provided on this After Visit Summary.  MyChart is used to connect with patients for Virtual Visits (Telemedicine).  Patients are able to view lab/test results, encounter notes, upcoming appointments, etc.  Non-urgent messages can be sent to your provider as well.   To learn more about what you can do with MyChart, go to [email or web address].    Your next appointment:   AFTER TESTING   The format for your next appointment:   In Person  Provider:   DR Tresa Endo FOR SLEEP FOLLOW UP     Other Instructions   Important Information About Sugar         Signed, Nicki Guadalajara, MD,FACC, ABSM [employer] [facility]  08/22/2022 6:55  PM    [facility] [address], [address], [place], [place]  [address] Phone: [phone]

## 2022-08-22 ENCOUNTER — Encounter: Payer: Self-pay | Admitting: Cardiovascular Disease

## 2022-08-29 ENCOUNTER — Other Ambulatory Visit (HOSPITAL_COMMUNITY): Payer: 59

## 2022-09-03 ENCOUNTER — Telehealth: Payer: Self-pay | Admitting: *Deleted

## 2022-09-03 NOTE — Telephone Encounter (Signed)
Prior Authorization for split night sleep study sent to [facility] via Phone. Per automated system no PA is required. Call reference # not provided.

## 2022-09-09 ENCOUNTER — Ambulatory Visit (HOSPITAL_COMMUNITY): Payer: 59 | Attending: Cardiovascular Disease

## 2022-09-09 DIAGNOSIS — G4733 Obstructive sleep apnea (adult) (pediatric): Secondary | ICD-10-CM

## 2022-09-09 DIAGNOSIS — G4734 Idiopathic sleep related nonobstructive alveolar hypoventilation: Secondary | ICD-10-CM

## 2022-09-09 DIAGNOSIS — I5042 Chronic combined systolic (congestive) and diastolic (congestive) heart failure: Secondary | ICD-10-CM

## 2022-09-09 LAB — ECHOCARDIOGRAM COMPLETE
Area-P 1/2: 5.79 cm2
P 1/2 time: 301 msec
S' Lateral: 3 cm

## 2022-09-15 ENCOUNTER — Telehealth: Payer: Self-pay | Admitting: Physical Therapy

## 2022-09-15 ENCOUNTER — Ambulatory Visit: Payer: 59 | Attending: Internal Medicine | Admitting: Physical Therapy

## 2022-09-15 DIAGNOSIS — R2689 Other abnormalities of gait and mobility: Secondary | ICD-10-CM

## 2022-09-15 DIAGNOSIS — M6281 Muscle weakness (generalized): Secondary | ICD-10-CM

## 2022-09-15 DIAGNOSIS — R2681 Unsteadiness on feet: Secondary | ICD-10-CM

## 2022-09-15 NOTE — Therapy (Signed)
OUTPATIENT PHYSICAL THERAPY NEURO EVALUATION   Patient Name: Christopher Morales MRN: [id number] DOB:1971-03-21, 51 y.o., male Today's Date: 09/15/2022   PCP: Wilfred Curtis, MD REFERRING PROVIDER: Wilfred Curtis, MD  END OF SESSION:  PT End of Session - 09/15/22 1446     Visit Number 1    Number of Visits 13   with eval   Date for PT Re-Evaluation 11/10/22   to allow for scheduling conflicts   Authorization Type [employer]    Progress Note Due on Visit 10    PT Start Time 1445    PT Stop Time 1527    PT Time Calculation (min) 42 min    Activity Tolerance Patient tolerated treatment well    Behavior During Therapy WFL for tasks assessed/performed             Past Medical History:  Diagnosis Date   CHF (congestive heart failure) (HCC)    Hypertension    Renal disorder    No past surgical history on file. There are no problems to display for this patient.   ONSET DATE: 09/10/2022  REFERRING DIAG: M25.519 (ICD-10-CM) - Pain in unspecified shoulder G89.29 (ICD-10-CM) - Chronic pain R26.89 (ICD-10-CM) - Other abnormalities of gait and mobility R29.898 (ICD-10-CM) - Other symptoms and signs involving the musculoskeletal system Z86.73 (ICD-10-CM) - History of stroke  THERAPY DIAG:  Muscle weakness (generalized)  Other abnormalities of gait and mobility  Unsteadiness on feet  Rationale for Evaluation and Treatment: Rehabilitation  SUBJECTIVE:                                                                                                                                                                                             SUBJECTIVE STATEMENT: Pt reports after his stroke in March he went to [facility] and it took 3 people just to help him get up. Pt reports he then had a heart attach and heart failure while in [facility]. Pt reports by the time he d/c home he was walking safely with the RW. Pt then reports in late June/early July he had a sodium deficiency and wound up in ICU. Pt reports  he feels like his health has been stable since then and he recently wrapped up with home health therapies. Pt reports ongoing numbness in LUE>RUE and LLE. Pt reports he feels he needs to work on his strength, endurance, and balance. When he has to stand for 2-3 hours with his RW he feels like his LE get stiff.  Pt accompanied by: self  PERTINENT HISTORY: chronic systolic heart failure, hx of MI, heart failure, cardiac cath April 2023  and stent, HTN, renal disorder, CVA March 2023  PAIN:  Are you having pain? No  PRECAUTIONS: Fall  WEIGHT BEARING RESTRICTIONS: No  FALLS: Has patient fallen in last 6 months? Yes. Number of falls 1, right after d/c from inpatient while reaching for bedcovers at 4 am and ended up on the floor, had to call EMS to get back up  LIVING ENVIRONMENT: Lives with: lives with their family and stays with his mother temporarily; plans to move into his new home in the spring Lives in: House/apartment Stairs: Yes: Internal: 1 steps; none and External: 5 steps; on left going up Has following equipment at home: Single point cane, Walker - 2 wheeled, Wheelchair (manual), and bed side commode  PLOF: Independent with gait, Independent with transfers, and Requires assistive device for independence; uses SPC around the house, uses RW in the community and at night; pt sponge-bathing as he cannot safely get in/out of the shower at his mother's house  PATIENT GOALS: "get comfortable walking with a cane, particularly outside" "work on stairs"; pt is a Theme park manager  OBJECTIVE:   DIAGNOSTIC FINDINGS: None in chart related to this episode of care (previous care received outside of this health system)  COGNITION: Overall cognitive status: Within functional limits for tasks assessed   SENSATION: Reports numbness in LUE and in R fingers, numbness in LLE  COORDINATION: Decreased in LLE vs RLE  EDEMA:  Swelling in BLE, LLE>RLE, wears compression stockings  POSTURE: rounded shoulders,  forward head, and posterior pelvic tilt   LOWER EXTREMITY MMT:    MMT Right Eval Left Eval  Hip flexion 5 4  Hip extension    Hip abduction    Hip adduction    Hip internal rotation    Hip external rotation    Knee flexion 5 4  Knee extension 5 4  Ankle dorsiflexion 4 4  Ankle plantarflexion    Ankle inversion    Ankle eversion    (Blank rows = not tested)  BED MOBILITY:  Independent per pt report  TRANSFERS: Assistive device utilized: Environmental consultant - 2 wheeled  Sit to stand: Modified independence Stand to sit: Modified independence Chair to chair: Modified independence Floor:  not assessed at eval  GAIT: Gait pattern: decreased step length- Right, decreased stance time- Left, decreased hip/knee flexion- Left, decreased ankle dorsiflexion- Left, and genu recurvatum- Left Distance walked: various clinic distances Assistive device utilized: Environmental consultant - 2 wheeled Level of assistance: Modified independence Comments: pt reports he feels that he is "limping"  FUNCTIONAL TESTS:    [facility] PT Assessment - 09/15/22 1506       Ambulation/Gait   Gait velocity 32.8 ft over 18.28 sec = 1.79 ft/sec      Standardized Balance Assessment   Standardized Balance Assessment Timed Up and Go Test;Five Times Sit to Stand    Five times sit to stand comments  16.62 sec   pushing up with BUE from arms of chair to RW     Timed Up and Go Test   TUG Normal TUG    Normal TUG (seconds) 21.19   with RW            TODAY'S TREATMENT:  PT evaluation    PATIENT EDUCATION: Education details: Eval findings, POC Person educated: Patient Education method: Customer service manager Education comprehension: verbalized understanding, returned demonstration, and needs further education  HOME EXERCISE PROGRAM: To be established next session  GOALS: Goals reviewed with  patient? Yes  SHORT TERM GOALS: Target date: 10/06/2022  Pt will be independent with initial HEP for improved strength, balance, transfers and gait. Baseline: Goal status: INITIAL  2.  Pt will improve gait velocity to at least 2.0 ft/sec for improved gait efficiency and performance at mod I level  Baseline: 1.79 ft/sec (11/20) Goal status: INITIAL  3.  6MWT to be assessed and STG written Baseline:  Goal status: INITIAL  4.  Berg or FGA to be assess and STG written Baseline:  Goal status: INITIAL   LONG TERM GOALS: Target date: 10/27/2022  Pt will be independent with final HEP for improved strength, balance, transfers and gait. Baseline:  Goal status: INITIAL  2.  Pt will improve gait velocity to at least 2.25 ft/sec for improved gait efficiency and performance at mod I level  Baseline: 1.79 ft/sec (11/20) Goal status: INITIAL  3.  Pt will improve normal TUG to less than or equal to 15 seconds for improved functional mobility and decreased fall risk. Baseline: 21.19 sec with RW (11/20) Goal status: INITIAL  4.  6MWT to be assessed and LTG written Baseline:  Goal status: INITIAL  5.  Berg or FGA to be assessed and LTG written Baseline:  Goal status: INITIAL  6.  Stairs to be assessed and LTG written Baseline:  Goal status: INITIAL  ASSESSMENT:  CLINICAL IMPRESSION: Patient is a 51 year old male referred to Neuro OPPT for CVA.   Pt's PMH is significant for: chronic systolic heart failure, hx of MI, heart failure, cardiac cath April 2023 and stent, HTN, renal disorder, CVA March 2023. The following deficits were present during the exam: decreased gait speed, decreased LE strength, and increased fall risk. Based on his history of falls, gait speed of 1.79 ft/sec, and TUG score of 21.19 sec, pt is an increased risk for falls. Pt would benefit from skilled PT to address these impairments and functional limitations to maximize functional mobility independence.   OBJECTIVE  IMPAIRMENTS: Abnormal gait, cardiopulmonary status limiting activity, decreased activity tolerance, decreased balance, decreased endurance, decreased strength, increased edema, impaired perceived functional ability, and impaired sensation.   ACTIVITY LIMITATIONS: carrying, lifting, bending, squatting, stairs, and reach over head  PARTICIPATION LIMITATIONS: driving, community activity, occupation, and church  PERSONAL FACTORS: chronic systolic heart failure, hx of MI, heart failure, cardiac cath April 2023 and stent, HTN, renal disorder, CVA March 2023 are also affecting patient's functional outcome.   REHAB POTENTIAL: Good  CLINICAL DECISION MAKING: Stable/uncomplicated  EVALUATION COMPLEXITY: Moderate  PLAN:  PT FREQUENCY: 2x/week  PT DURATION: 6 weeks  PLANNED INTERVENTIONS: Therapeutic exercises, Therapeutic activity, Neuromuscular re-education, Balance training, Gait training, Patient/Family education, Self Care, Joint mobilization, Stair training, Vestibular training, Canalith repositioning, Visual/preceptual remediation/compensation, Orthotic/Fit training, DME instructions, Aquatic Therapy, Dry Needling, Electrical stimulation, Cryotherapy, Moist heat, Taping, Manual therapy, and Re-evaluation  PLAN FOR NEXT SESSION: assess 6MWT, Berg, maybe FGA and write STG/LTG, assess stairs and write LTG, L quad control, LLE NMR and strengthening, initiate HEP, work towards floor transfers if safe and able   Excell Seltzer, PT, DPT, CSRS 09/15/2022, 9:26 PM

## 2022-09-16 ENCOUNTER — Ambulatory Visit: Payer: 59 | Admitting: Physical Therapy

## 2022-09-22 ENCOUNTER — Encounter: Payer: Self-pay | Admitting: Physical Therapy

## 2022-09-22 ENCOUNTER — Ambulatory Visit: Payer: 59 | Admitting: Physical Therapy

## 2022-09-22 DIAGNOSIS — M6281 Muscle weakness (generalized): Secondary | ICD-10-CM

## 2022-09-22 DIAGNOSIS — R2689 Other abnormalities of gait and mobility: Secondary | ICD-10-CM

## 2022-09-22 DIAGNOSIS — R2681 Unsteadiness on feet: Secondary | ICD-10-CM

## 2022-09-22 NOTE — Patient Instructions (Signed)
Access Code: [facility] URL: [email or web address] Date: 09/22/2022 Prepared by: Camille Bal  Exercises - Sit to Stand  - 1 x daily - 7 x weekly - 3 sets - 5 reps

## 2022-09-22 NOTE — Therapy (Signed)
OUTPATIENT PHYSICAL THERAPY NEURO TREATMENT   Patient Name: Christopher Morales MRN: [id number] DOB:1971-07-05, 51 y.o., male Today's Date: 09/22/2022   PCP: Leotis Pain, MD REFERRING PROVIDER: Leotis Pain, MD  END OF SESSION:  PT End of Session - 09/22/22 1534     Visit Number 2    Number of Visits 13   with eval   Date for PT Re-Evaluation 11/10/22   to allow for scheduling conflicts   Authorization Type [employer]    Progress Note Due on Visit 10    PT Start Time 1531    PT Stop Time 1613    PT Time Calculation (min) 42 min    Equipment Utilized During Treatment Gait belt    Activity Tolerance Patient tolerated treatment well    Behavior During Therapy WFL for tasks assessed/performed             Past Medical History:  Diagnosis Date   CHF (congestive heart failure) ([place])    Hypertension    Renal disorder    History reviewed. No pertinent surgical history. There are no problems to display for this patient.   ONSET DATE: 09/10/2022  REFERRING DIAG: M25.519 (ICD-10-CM) - Pain in unspecified shoulder G89.29 (ICD-10-CM) - Chronic pain R26.89 (ICD-10-CM) - Other abnormalities of gait and mobility R29.898 (ICD-10-CM) - Other symptoms and signs involving the musculoskeletal system Z86.73 (ICD-10-CM) - History of stroke  THERAPY DIAG:  Muscle weakness (generalized)  Other abnormalities of gait and mobility  Unsteadiness on feet  Rationale for Evaluation and Treatment: Rehabilitation  SUBJECTIVE:                                                                                                                                                                                             SUBJECTIVE STATEMENT: He denies falls or other acute changes or issues since evaluation.  Pt accompanied by: self  PERTINENT HISTORY: chronic systolic heart failure, hx of MI, heart failure, cardiac cath April 2023 and stent, HTN, renal disorder, CVA March 2023  PAIN:  Are you having  pain? No  PRECAUTIONS: Fall  WEIGHT BEARING RESTRICTIONS: No  OBJECTIVE:    TODAY'S TREATMENT:                                                                                                                               -  6MWT w/ RW:  465' -BERG assessed:  [facility] PT Assessment - 09/22/22 1546       Balance   Balance Assessed Yes      Standardized Balance Assessment   Standardized Balance Assessment Berg Balance Test      Berg Balance Test   Sit to Stand Able to stand  independently using hands    Standing Unsupported Able to stand safely 2 minutes    Sitting with Back Unsupported but Feet Supported on Floor or Stool Able to sit safely and securely 2 minutes    Stand to Sit Controls descent by using hands    Transfers Able to transfer safely, definite need of hands    Standing Unsupported with Eyes Closed Able to stand 10 seconds safely    Standing Unsupported with Feet Together Able to place feet together independently and stand 1 minute safely    From Standing, Reach Forward with Outstretched Arm Can reach forward >12 cm safely (5")    From Standing Position, Pick up Object from Floor Able to pick up shoe, needs supervision    From Standing Position, Turn to Look Behind Over each Shoulder Turn sideways only but maintains balance    Turn 360 Degrees Able to turn 360 degrees safely but slowly    Standing Unsupported, Alternately Place Feet on Step/Stool Able to complete >2 steps/needs minimal assist    Standing Unsupported, One Foot in Front Needs help to step but can hold 15 seconds    Standing on One Leg Tries to lift leg/unable to hold 3 seconds but remains standing independently    Total Score 38    Berg comment: Significant Fall Risk            STAIRS:  Level of Assistance: SBA  Stair Negotiation Technique: Step to Pattern Forwards with Single Rail on Left  Number of Stairs: 4   Height of Stairs: 6"  Comments: Pt has to rest at top of stairs due to being winded.   He verbalizes fear of falling on descent.  Leads w/ RLE ascending and LLE descending using bilateral UE pulling on left rail during ascent.  Explained how this may also be exasperating shoulder pain.  Initiated HEP: -STS no UE support 3x5  PATIENT EDUCATION: Education details: Discussed pt reaching out to MD for OT referral for shoulder treatment due to inability of therapist to reach MD.  Initial HEP and outcomes of all assessments.  Answered questions about various cane types and progression to cane in clinic. Person educated: Patient Education method: Customer service manager Education comprehension: verbalized understanding, returned demonstration, and needs further education  HOME EXERCISE PROGRAM: Access Code: [email or web address] URL: [email or web address] Date: 09/22/2022 Prepared by: Elease Etienne  Exercises - Sit to Stand  - 1 x daily - 7 x weekly - 3 sets - 5 reps  GOALS: Goals reviewed with patient? Yes  SHORT TERM GOALS: Target date: 10/06/2022  Pt will be independent with initial HEP for improved strength, balance, transfers and gait. Baseline: Goal status: INITIAL  2.  Pt will improve gait velocity to at least 2.0 ft/sec for improved gait efficiency and performance at mod I level  Baseline: 1.79 ft/sec (11/20) Goal status: INITIAL  3.  Pt will ambulate >/=550 feet on 6MWT to demonstrate improved functional endurance for home and community participation. Baseline: 465' w/ RW (11/27) Goal status: INITIAL  4.  Pt will increase BERG balance score to 44/56 to demonstrate improved static balance. Baseline: 38/56 (11/27) Goal status: INITIAL  LONG TERM GOALS: Target date: 10/27/2022  Pt will be independent with final HEP for improved strength, balance, transfers and gait. Baseline:  Goal status: INITIAL  2.  Pt will improve gait velocity to at least 2.25 ft/sec for improved gait efficiency and performance at mod I level  Baseline: 1.79 ft/sec  (11/20) Goal status: INITIAL  3.  Pt will improve normal TUG to less than or equal to 15 seconds for improved functional mobility and decreased fall risk. Baseline: 21.19 sec with RW (11/20) Goal status: INITIAL  4.  Pt will ambulate >650 feet on to demonstrate improved functional endurance for home and community participation. Baseline: 465' w/ RW (11/27) Goal status: INITIAL  5.  Pt will increase BERG balance score to 48/56 to demonstrate improved static balance. Baseline: 38/56 (11/27) Goal status: INITIAL  6.  Pt will ambulate up and down 12 stairs w/ reciprocal gait at mod I level using rail and cane as needed to improve safe access to home and community environments. Baseline: 4 stairs step to w/ BUE on left rail Goal status: INITIAL  ASSESSMENT:  CLINICAL IMPRESSION: Pt scored a 38/56 on BERG assessment today with primary issues being trunk rotation and turning and variations of narrowed and single leg stance.  He ambulates 28' during using RW without need for rest.  Further assessed stairs w/ pt demonstrating increased upper body reliance.  All assessments today indicate a elevated fall risk for this patient.  Initiated basic strengthening HEP and will update at coming visit to further address deficits noted this visit.   OBJECTIVE IMPAIRMENTS: Abnormal gait, cardiopulmonary status limiting activity, decreased activity tolerance, decreased balance, decreased endurance, decreased strength, increased edema, impaired perceived functional ability, and impaired sensation.   ACTIVITY LIMITATIONS: carrying, lifting, bending, squatting, stairs, and reach over head  PARTICIPATION LIMITATIONS: driving, community activity, occupation, and church  PERSONAL FACTORS: chronic systolic heart failure, hx of MI, heart failure, cardiac cath April 2023 and stent, HTN, renal disorder, CVA March 2023 are also affecting patient's functional outcome.   REHAB POTENTIAL: Good  CLINICAL  DECISION MAKING: Stable/uncomplicated  EVALUATION COMPLEXITY: Moderate  PLAN:  PT FREQUENCY: 2x/week  PT DURATION: 6 weeks  PLANNED INTERVENTIONS: Therapeutic exercises, Therapeutic activity, Neuromuscular re-education, Balance training, Gait training, Patient/Family education, Self Care, Joint mobilization, Stair training, Vestibular training, Canalith repositioning, Visual/preceptual remediation/compensation, Orthotic/Fit training, DME instructions, Aquatic Therapy, Dry Needling, Electrical stimulation, Cryotherapy, Moist heat, Taping, Manual therapy, and Re-evaluation  PLAN FOR NEXT SESSION:  L quad control, LLE NMR and strengthening, Add to HEP, work towards floor transfers if safe and able   Sadie Haber, PT, DPT 09/22/2022, 4:57 PM

## 2022-09-24 ENCOUNTER — Ambulatory Visit (HOSPITAL_BASED_OUTPATIENT_CLINIC_OR_DEPARTMENT_OTHER): Payer: 59 | Attending: Cardiovascular Disease | Admitting: Cardiovascular Disease

## 2022-09-24 VITALS — Ht 65.0 in | Wt 157.0 lb

## 2022-09-24 DIAGNOSIS — I493 Ventricular premature depolarization: Secondary | ICD-10-CM | POA: Diagnosis not present

## 2022-09-24 DIAGNOSIS — I5042 Chronic combined systolic (congestive) and diastolic (congestive) heart failure: Secondary | ICD-10-CM | POA: Insufficient documentation

## 2022-09-24 DIAGNOSIS — G4734 Idiopathic sleep related nonobstructive alveolar hypoventilation: Secondary | ICD-10-CM

## 2022-09-24 DIAGNOSIS — G4733 Obstructive sleep apnea (adult) (pediatric): Secondary | ICD-10-CM | POA: Insufficient documentation

## 2022-09-25 ENCOUNTER — Ambulatory Visit: Payer: 59 | Admitting: Physical Therapy

## 2022-09-26 ENCOUNTER — Ambulatory Visit: Payer: 59 | Attending: Internal Medicine | Admitting: Physical Therapy

## 2022-09-26 DIAGNOSIS — R2681 Unsteadiness on feet: Secondary | ICD-10-CM | POA: Diagnosis present

## 2022-09-26 DIAGNOSIS — R2689 Other abnormalities of gait and mobility: Secondary | ICD-10-CM | POA: Diagnosis present

## 2022-09-26 DIAGNOSIS — M25512 Pain in left shoulder: Secondary | ICD-10-CM | POA: Insufficient documentation

## 2022-09-26 DIAGNOSIS — G8929 Other chronic pain: Secondary | ICD-10-CM | POA: Insufficient documentation

## 2022-09-26 DIAGNOSIS — M25612 Stiffness of left shoulder, not elsewhere classified: Secondary | ICD-10-CM | POA: Diagnosis present

## 2022-09-26 DIAGNOSIS — R208 Other disturbances of skin sensation: Secondary | ICD-10-CM | POA: Diagnosis present

## 2022-09-26 DIAGNOSIS — M6281 Muscle weakness (generalized): Secondary | ICD-10-CM

## 2022-09-26 NOTE — Therapy (Signed)
OUTPATIENT PHYSICAL THERAPY NEURO TREATMENT   Patient Name: Christopher Morales MRN: [id number] DOB:[birth year], 51 y.o., male Today's Date: 09/26/2022   PCP: Wilfred Curtis, MD REFERRING PROVIDER: Wilfred Curtis, MD  END OF SESSION:  PT End of Session - 09/26/22 1447     Visit Number 3    Number of Visits 13   with eval   Date for PT Re-Evaluation 11/10/22   to allow for scheduling conflicts   Authorization Type [employer]    Progress Note Due on Visit 10    PT Start Time 1445    PT Stop Time 1533    PT Time Calculation (min) 48 min    Equipment Utilized During Treatment Gait belt    Activity Tolerance Patient tolerated treatment well    Behavior During Therapy WFL for tasks assessed/performed              Past Medical History:  Diagnosis Date   CHF (congestive heart failure) (HCC)    Hypertension    Renal disorder    No past surgical history on file. Patient Active Problem List   Diagnosis Date Noted   OSA (obstructive sleep apnea) 09/24/2022   Chronic combined systolic and diastolic congestive heart failure (HCC) 09/24/2022   Nocturnal hypoxia 09/24/2022    ONSET DATE: 09/10/2022  REFERRING DIAG: M25.519 (ICD-10-CM) - Pain in unspecified shoulder G89.29 (ICD-10-CM) - Chronic pain R26.89 (ICD-10-CM) - Other abnormalities of gait and mobility R29.898 (ICD-10-CM) - Other symptoms and signs involving the musculoskeletal system Z86.73 (ICD-10-CM) - History of stroke  THERAPY DIAG:  Muscle weakness (generalized)  Other abnormalities of gait and mobility  Unsteadiness on feet  Rationale for Evaluation and Treatment: Rehabilitation  SUBJECTIVE:                                                                                                                                                                                             SUBJECTIVE STATEMENT: Pt reports no falls since last session, no pain today, no other acute changes. Pt reports he has been working on his sit to  stands from his bed, other surfaces are too low and he needs UE support.  Pt accompanied by: self  PERTINENT HISTORY: chronic systolic heart failure, hx of MI, heart failure, cardiac cath April 2023 and stent, HTN, renal disorder, CVA March 2023  PAIN:  Are you having pain? No  PRECAUTIONS: Fall  WEIGHT BEARING RESTRICTIONS: No  OBJECTIVE:    TODAY'S TREATMENT:  THER EX: SciFit multi-peaks level 3 for 5 minutes using BUE/BLEs for neural priming for reciprocal movement, dynamic cardiovascular warmup and increased amplitude of stepping. RPE of 6/10 following activity.  At mat table for LE strengthening: Mini-squats 3 x 10 reps with mod cueing for correct body mechanics Added to HEP (see bolded below)   THER ACT: At bottom of stairs: Standing alt L/R 6" step-taps with 2 handrails and CGA Standing alt L/R 6" step-ups with 2 handrails and CGA Per pt report he only has L handrail at home and stairs are located outdoors, unsafe to perform at home by himself  In // bars: Eccentric step downs with 6" step, 4" step, and 2" step with difficulty unlocking L knee even with 2" step    PATIENT EDUCATION: Education details: again discussed pt reaching out to MD for OT referral for shoulder treatment due to inability of therapist to reach MD.  Added to HEP, discussed progression from RW to [facility] Person educated: Patient Education method: Explanation, Demonstration, and Handouts Education comprehension: verbalized understanding, returned demonstration, and needs further education  HOME EXERCISE PROGRAM: Access Code: [facility] URL: [email or web address] Date: 09/22/2022 Prepared by: Camille Bal  Exercises - Sit to Stand  - 1 x daily - 7 x weekly - 3 sets - 5 reps - Mini Squat with Counter Support  - 1 x daily - 7 x weekly - 3 sets - 10  reps  GOALS: Goals reviewed with patient? Yes  SHORT TERM GOALS: Target date: 10/06/2022  Pt will be independent with initial HEP for improved strength, balance, transfers and gait. Baseline: Goal status: INITIAL  2.  Pt will improve gait velocity to at least 2.0 ft/sec for improved gait efficiency and performance at mod I level  Baseline: 1.79 ft/sec (11/20) Goal status: INITIAL  3.  Pt will ambulate >/=550 feet on to demonstrate improved functional endurance for home and community participation. Baseline: 465' w/ RW (11/27) Goal status: INITIAL  4.  Pt will increase BERG balance score to 44/56 to demonstrate improved static balance. Baseline: 38/56 (11/27) Goal status: INITIAL   LONG TERM GOALS: Target date: 10/27/2022  Pt will be independent with final HEP for improved strength, balance, transfers and gait. Baseline:  Goal status: INITIAL  2.  Pt will improve gait velocity to at least 2.25 ft/sec for improved gait efficiency and performance at mod I level  Baseline: 1.79 ft/sec (11/20) Goal status: INITIAL  3.  Pt will improve normal TUG to less than or equal to 15 seconds for improved functional mobility and decreased fall risk. Baseline: 21.19 sec with RW (11/20) Goal status: INITIAL  4.  Pt will ambulate >650 feet on to demonstrate improved functional endurance for home and community participation. Baseline: 465' w/ RW (11/27) Goal status: INITIAL  5.  Pt will increase BERG balance score to 48/56 to demonstrate improved static balance. Baseline: 38/56 (11/27) Goal status: INITIAL  6.  Pt will ambulate up and down 12 stairs w/ reciprocal gait at mod I level using rail and cane as needed to improve safe access to home and community environments. Baseline: 4 stairs step to w/ BUE on left rail Goal status: INITIAL  ASSESSMENT:  CLINICAL IMPRESSION: Emphasis of skilled PT session on adding to HEP for LE strengthening and balance training as well as  working on various strengthening tasks in clinic setting to determine what would be safe to pt to continue to working on at home. Pt continues to exhibit decreased LLE strength  and control with difficulty performing eccentric step-downs with RLE and unlocking L knee. Pt initially requires mod cues for correct body mechanics during squats but progresses to being able to perform independently, given as HEP. Pt continues to benefit from skilled therapy services to address ongoing L hemibody weakness leading to decreased safety and independence with with functional mobility. Continue POC.    OBJECTIVE IMPAIRMENTS: Abnormal gait, cardiopulmonary status limiting activity, decreased activity tolerance, decreased balance, decreased endurance, decreased strength, increased edema, impaired perceived functional ability, and impaired sensation.   ACTIVITY LIMITATIONS: carrying, lifting, bending, squatting, stairs, and reach over head  PARTICIPATION LIMITATIONS: driving, community activity, occupation, and church  PERSONAL FACTORS: chronic systolic heart failure, hx of MI, heart failure, cardiac cath April 2023 and stent, HTN, renal disorder, CVA March 2023 are also affecting patient's functional outcome.   REHAB POTENTIAL: Good  CLINICAL DECISION MAKING: Stable/uncomplicated  EVALUATION COMPLEXITY: Moderate  PLAN:  PT FREQUENCY: 2x/week  PT DURATION: 6 weeks  PLANNED INTERVENTIONS: Therapeutic exercises, Therapeutic activity, Neuromuscular re-education, Balance training, Gait training, Patient/Family education, Self Care, Joint mobilization, Stair training, Vestibular training, Canalith repositioning, Visual/preceptual remediation/compensation, Orthotic/Fit training, DME instructions, Aquatic Therapy, Dry Needling, Electrical stimulation, Cryotherapy, Moist heat, Taping, Manual therapy, and Re-evaluation  PLAN FOR NEXT SESSION:  L quad control, LLE NMR and strengthening, Add to HEP, work towards floor  transfers if safe and able,Try curb step with RW and or/SPC, compare gait with RW vs with SPC  Peter Congo, PT, DPT, CSRS 09/26/2022, 3:34 PM

## 2022-09-29 ENCOUNTER — Ambulatory Visit: Payer: 59 | Admitting: Physical Therapy

## 2022-09-29 ENCOUNTER — Encounter: Payer: Self-pay | Admitting: Physical Therapy

## 2022-09-29 DIAGNOSIS — R2681 Unsteadiness on feet: Secondary | ICD-10-CM

## 2022-09-29 DIAGNOSIS — R2689 Other abnormalities of gait and mobility: Secondary | ICD-10-CM

## 2022-09-29 DIAGNOSIS — M6281 Muscle weakness (generalized): Secondary | ICD-10-CM

## 2022-09-29 NOTE — Therapy (Signed)
OUTPATIENT PHYSICAL THERAPY NEURO TREATMENT   Patient Name: Christopher Morales MRN: [id number] DOB:Apr 02, 1971, 51 y.o., male Today's Date: 09/29/2022   PCP: Wilfred Curtis, MD REFERRING PROVIDER: Wilfred Curtis, MD  END OF SESSION:  PT End of Session - 09/29/22 1406     Visit Number 4    Number of Visits 13   with eval   Date for PT Re-Evaluation 11/10/22   to allow for scheduling conflicts   Authorization Type [employer]    Progress Note Due on Visit 10    PT Start Time 1402    PT Stop Time 1446    PT Time Calculation (min) 44 min    Equipment Utilized During Treatment Gait belt    Activity Tolerance Patient tolerated treatment well    Behavior During Therapy WFL for tasks assessed/performed              Past Medical History:  Diagnosis Date   CHF (congestive heart failure) (HCC)    Hypertension    Renal disorder    History reviewed. No pertinent surgical history. Patient Active Problem List   Diagnosis Date Noted   OSA (obstructive sleep apnea) 09/24/2022   Chronic combined systolic and diastolic congestive heart failure (HCC) 09/24/2022   Nocturnal hypoxia 09/24/2022    ONSET DATE: 09/10/2022  REFERRING DIAG: M25.519 (ICD-10-CM) - Pain in unspecified shoulder G89.29 (ICD-10-CM) - Chronic pain R26.89 (ICD-10-CM) - Other abnormalities of gait and mobility R29.898 (ICD-10-CM) - Other symptoms and signs involving the musculoskeletal system Z86.73 (ICD-10-CM) - History of stroke  THERAPY DIAG:  Muscle weakness (generalized)  Other abnormalities of gait and mobility  Unsteadiness on feet  Rationale for Evaluation and Treatment: Rehabilitation  SUBJECTIVE:                                                                                                                                                                                             SUBJECTIVE STATEMENT: Pt reports no falls since last session.  He has an appt with the cardiologist tomorrow and anticipates a  medication change.  Pt accompanied by: self  PERTINENT HISTORY: chronic systolic heart failure, hx of MI, heart failure, cardiac cath April 2023 and stent, HTN, renal disorder, CVA March 2023  PAIN:  Are you having pain? No  PRECAUTIONS: Fall  WEIGHT BEARING RESTRICTIONS: No  OBJECTIVE:    TODAY'S TREATMENT:  THER EX: -STS x10 w/ cuing and return demonstration for scooting to EOM -calf raises w/ BUE support w/ 3 count eccentric lower  NMR: -alternating 8" cone taps w/ UE reach, CGA-SBA, 3 rounds w/ focus on diaphragmatic breathing during rest periods as pt holds breath during task, edu on breathing pattern to ease task of reaching to floor level -Alternating 8" cone taps w/ LE progressing from BUE support to no UE support w/ minA to promote left weight shift when in left stance w/ edu to engage left quad throughout stance. -SLS w/ BUE support 2x30 sec alt LE w/ minA to encourage correct form in left stance w/ engagement of left quad -Standing 2.2lb trunk rotations progressed to D1/D2 patterns w/ cues to engage head rotation to task -tandem w/ BUE support in // bars, pt requires minA to place LLE in front, able to progress to no UE support over time w/ close SBA  PATIENT EDUCATION: Education details: Additions to HEP, briefly discussed exercises from prior HHPT-pt unsure how to describe exercises, but briefly demos one where he is rocking so PT explains potential benefits of this exercise. Person educated: Patient Education method: Explanation, Demonstration, and Handouts Education comprehension: verbalized understanding, returned demonstration, and needs further education  HOME EXERCISE PROGRAM: Access Code: [email or web address] URL: [email or web address] Date: 09/22/2022 Prepared by: Camille Bal  Exercises - Sit to Stand  - 1 x daily - 7 x  weekly - 3 sets - 5 reps - Mini Squat with Counter Support  - 1 x daily - 7 x weekly - 3 sets - 10 reps - Heel Raises with Counter Support  - 1 x daily - 7 x weekly - 3 sets - 10 reps  GOALS: Goals reviewed with patient? Yes  SHORT TERM GOALS: Target date: 10/06/2022  Pt will be independent with initial HEP for improved strength, balance, transfers and gait. Baseline: Goal status: INITIAL  2.  Pt will improve gait velocity to at least 2.0 ft/sec for improved gait efficiency and performance at mod I level  Baseline: 1.79 ft/sec (11/20) Goal status: INITIAL  3.  Pt will ambulate >/=550 feet on to demonstrate improved functional endurance for home and community participation. Baseline: 465' w/ RW (11/27) Goal status: INITIAL  4.  Pt will increase BERG balance score to 44/56 to demonstrate improved static balance. Baseline: 38/56 (11/27) Goal status: INITIAL   LONG TERM GOALS: Target date: 10/27/2022  Pt will be independent with final HEP for improved strength, balance, transfers and gait. Baseline:  Goal status: INITIAL  2.  Pt will improve gait velocity to at least 2.25 ft/sec for improved gait efficiency and performance at mod I level  Baseline: 1.79 ft/sec (11/20) Goal status: INITIAL  3.  Pt will improve normal TUG to less than or equal to 15 seconds for improved functional mobility and decreased fall risk. Baseline: 21.19 sec with RW (11/20) Goal status: INITIAL  4.  Pt will ambulate >650 feet on to demonstrate improved functional endurance for home and community participation. Baseline: 465' w/ RW (11/27) Goal status: INITIAL  5.  Pt will increase BERG balance score to 48/56 to demonstrate improved static balance. Baseline: 38/56 (11/27) Goal status: INITIAL  6.  Pt will ambulate up and down 12 stairs w/ reciprocal gait at mod I level using rail and cane as needed to improve safe access to home and community environments. Baseline: 4 stairs step to w/ BUE  on left rail Goal status: INITIAL  ASSESSMENT:  CLINICAL IMPRESSION:  Focus of skilled session on addressing components of static balance and general engagement of the left quad.  Pt demonstrates significant weakness of the left quad in SLS w/ therapist providing minA and facilitation to prevent compensation w/ hyperextension.  He continues to benefit from skilled PT services to further address deficits noted in ongoing PT POC.    OBJECTIVE IMPAIRMENTS: Abnormal gait, cardiopulmonary status limiting activity, decreased activity tolerance, decreased balance, decreased endurance, decreased strength, increased edema, impaired perceived functional ability, and impaired sensation.   ACTIVITY LIMITATIONS: carrying, lifting, bending, squatting, stairs, and reach over head  PARTICIPATION LIMITATIONS: driving, community activity, occupation, and church  PERSONAL FACTORS: chronic systolic heart failure, hx of MI, heart failure, cardiac cath April 2023 and stent, HTN, renal disorder, CVA March 2023 are also affecting patient's functional outcome.   REHAB POTENTIAL: Good  CLINICAL DECISION MAKING: Stable/uncomplicated  EVALUATION COMPLEXITY: Moderate  PLAN:  PT FREQUENCY: 2x/week  PT DURATION: 6 weeks  PLANNED INTERVENTIONS: Therapeutic exercises, Therapeutic activity, Neuromuscular re-education, Balance training, Gait training, Patient/Family education, Self Care, Joint mobilization, Stair training, Vestibular training, Canalith repositioning, Visual/preceptual remediation/compensation, Orthotic/Fit training, DME instructions, Aquatic Therapy, Dry Needling, Electrical stimulation, Cryotherapy, Moist heat, Taping, Manual therapy, and Re-evaluation  PLAN FOR NEXT SESSION:  L quad control, LLE NMR and strengthening, Add to HEP, work towards floor transfers if safe and able,Try curb step with RW and or/SPC, compare gait with RW vs with SPC  Sadie Haber, PT, DPT 09/29/2022, 3:21  PM

## 2022-09-29 NOTE — Patient Instructions (Signed)
-   Heel Raises with Counter Support  - 1 x daily - 7 x weekly - 3 sets - 10 reps

## 2022-10-02 ENCOUNTER — Ambulatory Visit: Payer: 59 | Admitting: Physical Therapy

## 2022-10-02 VITALS — BP 139/79

## 2022-10-02 DIAGNOSIS — M6281 Muscle weakness (generalized): Secondary | ICD-10-CM

## 2022-10-02 DIAGNOSIS — R2681 Unsteadiness on feet: Secondary | ICD-10-CM

## 2022-10-02 DIAGNOSIS — R2689 Other abnormalities of gait and mobility: Secondary | ICD-10-CM

## 2022-10-02 NOTE — Therapy (Signed)
OUTPATIENT PHYSICAL THERAPY NEURO TREATMENT   Patient Name: Christopher Morales MRN: [id number] DOB:1971-03-31, 51 y.o., male Today's Date: 10/02/2022   PCP: Wilfred Curtis, MD REFERRING PROVIDER: Wilfred Curtis, MD  END OF SESSION:  PT End of Session - 10/02/22 1403     Visit Number 5    Number of Visits 13   with eval   Date for PT Re-Evaluation 11/10/22   to allow for scheduling conflicts   Authorization Type [employer]    Progress Note Due on Visit 10    PT Start Time 1400    PT Stop Time 1450    PT Time Calculation (min) 50 min    Equipment Utilized During Treatment Gait belt    Activity Tolerance Patient tolerated treatment well    Behavior During Therapy WFL for tasks assessed/performed               Past Medical History:  Diagnosis Date   CHF (congestive heart failure) (HCC)    Hypertension    Renal disorder    No past surgical history on file. Patient Active Problem List   Diagnosis Date Noted   OSA (obstructive sleep apnea) 09/24/2022   Chronic combined systolic and diastolic congestive heart failure (HCC) 09/24/2022   Nocturnal hypoxia 09/24/2022    ONSET DATE: 09/10/2022  REFERRING DIAG: M25.519 (ICD-10-CM) - Pain in unspecified shoulder G89.29 (ICD-10-CM) - Chronic pain R26.89 (ICD-10-CM) - Other abnormalities of gait and mobility R29.898 (ICD-10-CM) - Other symptoms and signs involving the musculoskeletal system Z86.73 (ICD-10-CM) - History of stroke  THERAPY DIAG:  Muscle weakness (generalized)  Other abnormalities of gait and mobility  Unsteadiness on feet  Rationale for Evaluation and Treatment: Rehabilitation  SUBJECTIVE:                                                                                                                                                                                             SUBJECTIVE STATEMENT: Pt reports he had some tightness in this thighs after last session. Pt reports he walked in/out of cardiologist appointment  which was further than he was used to walking so that was a good challenge for him. Pt states the cardiologist is not going to make any changes to his medications just yet, he is going to wear a heart monitor x 2 weeks first.  Pt accompanied by: self  PERTINENT HISTORY: chronic systolic heart failure, hx of MI, heart failure, cardiac cath April 2023 and stent, HTN, renal disorder, CVA March 2023  PAIN:  Are you having pain? No  PRECAUTIONS: Fall  WEIGHT BEARING RESTRICTIONS: No  OBJECTIVE:  TODAY'S TREATMENT:           Vitals:   10/02/22 1408  BP: 139/79                                                                                                                        THER EX: At countertop: Sidesteps L/R x 10 ft each direction Resisted sidesteps L/R 2 x 10 ft each direction with RTB Standing hip flex x 10 reps B with one UE support Standing hip abd x 10 reps B with one UE support  At mat table: Supine bridges x 10 reps with 5 sec hold Supine SKFO x 10 reps B Supine SKTC stretch 3 x 30 sec each B Supine piriformis stretch 3 x 30 sec each B  Added supine exercises to HEP to work on hip strengthening as well as stretching. Pt exhibits significant hip tightness B (R>L) and can benefit from ongoing stretching to improve his functional mobility.   PATIENT EDUCATION: Education details: added hip strengthening and stretching to HEP, continue HEP Person educated: Patient Education method: Explanation, Demonstration, and Handouts Education comprehension: verbalized understanding, returned demonstration, and needs further education  HOME EXERCISE PROGRAM: Access Code: [facility] URL: [email or web address] Date: 09/22/2022 Prepared by: Camille Bal  Exercises - Sit to Stand  - 1 x daily - 7 x weekly - 3 sets - 5 reps - Mini Squat with Counter Support  - 1 x daily - 7 x weekly - 3 sets - 10 reps - Heel Raises with Counter Support  - 1 x daily - 7 x  weekly - 3 sets - 10 reps - Supine Bridge  - 1 x daily - 7 x weekly - 3 sets - 10 reps - Supine Single Bent Knee Fallout  - 1 x daily - 7 x weekly - 3 sets - 10 reps - Supine Single Knee to Chest Stretch  - 1 x daily - 7 x weekly - 2 sets - 5 reps - 30-60 sec hold - Supine Piriformis Stretch with Leg Straight  - 1 x daily - 7 x weekly - 2 sets - 5 reps - 30-60 sec hold  GOALS: Goals reviewed with patient? Yes  SHORT TERM GOALS: Target date: 10/06/2022  Pt will be independent with initial HEP for improved strength, balance, transfers and gait. Baseline: Goal status: INITIAL  2.  Pt will improve gait velocity to at least 2.0 ft/sec for improved gait efficiency and performance at mod I level  Baseline: 1.79 ft/sec (11/20) Goal status: INITIAL  3.  Pt will ambulate >/=550 feet on to demonstrate improved functional endurance for home and community participation. Baseline: 465' w/ RW (11/27) Goal status: INITIAL  4.  Pt will increase BERG balance score to 44/56 to demonstrate improved static balance. Baseline: 38/56 (11/27) Goal status: INITIAL   LONG TERM GOALS: Target date: 10/27/2022  Pt will be independent with final HEP for improved strength, balance, transfers and gait. Baseline:  Goal status: INITIAL  2.  Pt will improve gait velocity to at least 2.25 ft/sec for improved gait efficiency and performance at mod I level  Baseline: 1.79 ft/sec (11/20) Goal status: INITIAL  3.  Pt will improve normal TUG to less than or equal to 15 seconds for improved functional mobility and decreased fall risk. Baseline: 21.19 sec with RW (11/20) Goal status: INITIAL  4.  Pt will ambulate >650 feet on to demonstrate improved functional endurance for home and community participation. Baseline: 465' w/ RW (11/27) Goal status: INITIAL  5.  Pt will increase BERG balance score to 48/56 to demonstrate improved static balance. Baseline: 38/56 (11/27) Goal status: INITIAL  6.  Pt will  ambulate up and down 12 stairs w/ reciprocal gait at mod I level using rail and cane as needed to improve safe access to home and community environments. Baseline: 4 stairs step to w/ BUE on left rail Goal status: INITIAL  ASSESSMENT:  CLINICAL IMPRESSION: Emphasis of skilled PT session on performing various LE strengthening and stretching exercises to work on improving B hip strength and ROM in order to improve overall functional mobility. Pt exhibits L hip weakness making it difficult to perform standing exercises correctly without compensations. Transitioned to supine position to work on hip strengthening in this position. Pt exhibits significant tightness/decreased ROM in B hips (R>L) so added in stretches for him to initiate working on at home as well. Pt continues to benefit from skilled therapy services to address L hemibody weakness, decreased endurance, decreased balance, and increased fall risk. Continue POC.    OBJECTIVE IMPAIRMENTS: Abnormal gait, cardiopulmonary status limiting activity, decreased activity tolerance, decreased balance, decreased endurance, decreased strength, increased edema, impaired perceived functional ability, and impaired sensation.   ACTIVITY LIMITATIONS: carrying, lifting, bending, squatting, stairs, and reach over head  PARTICIPATION LIMITATIONS: driving, community activity, occupation, and church  PERSONAL FACTORS: chronic systolic heart failure, hx of MI, heart failure, cardiac cath April 2023 and stent, HTN, renal disorder, CVA March 2023 are also affecting patient's functional outcome.   REHAB POTENTIAL: Good  CLINICAL DECISION MAKING: Stable/uncomplicated  EVALUATION COMPLEXITY: Moderate  PLAN:  PT FREQUENCY: 2x/week  PT DURATION: 6 weeks  PLANNED INTERVENTIONS: Therapeutic exercises, Therapeutic activity, Neuromuscular re-education, Balance training, Gait training, Patient/Family education, Self Care, Joint mobilization, Stair training,  Vestibular training, Canalith repositioning, Visual/preceptual remediation/compensation, Orthotic/Fit training, DME instructions, Aquatic Therapy, Dry Needling, Electrical stimulation, Cryotherapy, Moist heat, Taping, Manual therapy, and Re-evaluation  PLAN FOR NEXT SESSION:  STG assess, L quad control, LLE NMR and strengthening, work towards floor transfers if safe and able,Try curb step with RW and or/SPC, compare gait with RW vs with SPC, add to HEP for strengthening/ROM/balance  Peter Congo, PT, DPT, CSRS 10/02/2022, 2:53 PM

## 2022-10-05 ENCOUNTER — Encounter (HOSPITAL_BASED_OUTPATIENT_CLINIC_OR_DEPARTMENT_OTHER): Payer: Self-pay | Admitting: Cardiovascular Disease

## 2022-10-05 NOTE — Procedures (Signed)
Patient Name: Christopher Morales, Christopher Morales Date: 09/24/2022 Gender: Male D.O.B: March 29, 1971 Age (years): 64 Referring Provider: Shelva Majestic MD, ABSM Height (inches): 65 Interpreting Physician: Shelva Majestic MD, ABSM Weight (lbs): 157 RPSGT: Laren Everts BMI: 26 MRN: <br> Neck Size: 17.50 <br> <br> CLINICAL INFORMATION The patient is referred for a split night study with BPAP.  MEDICATIONS acetaminophen (TYLENOL) 325 MG tablet amiodarone (PACERONE) 200 MG tablet aspirin EC 81 MG tablet atorvastatin (LIPITOR) 80 MG tablet clonazePAM (KLONOPIN) 0.5 MG tablet cyanocobalamin (VITAMIN B12) 1000 MCG tablet ferrous sulfate 325 (65 FE) MG tablet furosemide (LASIX) 20 MG tablet isosorbide mononitrate (IMDUR) 30 MG 24 hr tablet Lactobacillus Rhamnosus, GG, (CULTURELLE) CAPS metoprolol succinate (TOPROL-XL) 25 MG 24 hr tablet nitroGLYCERIN (NITROSTAT) 0.4 MG SL tablet nystatin cream (MYCOSTATIN) pantoprazole (PROTONIX) 40 MG tablet tamsulosin (FLOMAX) 0.4 MG CAPS capsule ticagrelor (BRILINTA) 90 MG TABS tablet triamcinolone cream (KENALOG) 0.1 % Medications self-administered by patient taken the night of the study : ATORVASTATIN, BRILINTA, CLONAZEPAM, PANTOPRAZOLE SODIUM, TYLENOL  SLEEP STUDY TECHNIQUE As per the AASM Manual for the Scoring of Sleep and Associated Events v2.3 (April 2016) with a hypopnea requiring 4% desaturations.  The channels recorded and monitored were frontal, central and occipital EEG, electrooculogram (EOG), submentalis EMG (chin), nasal and oral airflow, thoracic and abdominal wall motion, anterior tibialis EMG, snore microphone, electrocardiogram, and pulse oximetry. Bi-level positive airway pressure (BiPAP) was initiated when the patient met split night criteria and was titrated according to treat sleep-disordered breathing.  RESPIRATORY PARAMETERS Diagnostic Total AHI (/hr): 52.4 RDI (/hr): 52.4 OA Index (/hr): - CA Index (/hr): 2.2 REM AHI  (/hr): N/A NREM AHI (/hr): 52.4 Supine AHI (/hr): 52.4 Non-supine AHI (/hr): N/A Min O2 Sat (%): 67.0 Mean O2 (%): 86.4 Time below 88% (min): 77.6   Titration Optimal IPAP Pressure (cm): 20 Optimal EPAP Pressure (cm): 16 AHI at Optimal Pressure (/hr): 1.9 Min O2 at Optimal Pressure (%): 92.0 Sleep % at Optimal (%): 99 Supine % at Optimal (%): 100   SLEEP ARCHITECTURE The study was initiated at 10:49:35 PM and terminated at 5:23:02 AM. The total recorded time was 393.4 minutes. EEG confirmed total sleep time was 307.2 minutes yielding a sleep efficiency of 78.1%. Sleep onset after lights out was 2.8 minutes with a REM latency of 272.0 minutes. The patient spent 15.0% of the night in stage N1 sleep, 71.8% in stage N2 sleep, 0.0% in stage N3 and 13.2% in REM. Wake after sleep onset (WASO) was 83.5 minutes. The Arousal Index was 21.3/hour.  LEG MOVEMENT DATA The total Periodic Limb Movements of Sleep (PLMS) were 0. The PLMS index was 0.0 .  CARDIAC DATA The 2 lead EKG demonstrated sinus rhythm. The mean heart rate was 100.0 beats per minute. Other EKG findings include: PVCs.  IMPRESSIONS - Severe obstructive sleep apnea occurred during the diagnostic portion of the study (AHI 52.4 /h; RDI 52.4/h; with absent REM sleep). CPAP was initiated at 5, titrated to 15 cm and was transitioned to BiPAP with optimal pressure at 20/16 cm of wate (AHI 0/h; O2 nadir 92%. - No significant central sleep apnea occurred during the diagnostic portion of the study (CAI 2.2/hour). - Severe oxygen desaturation was noted during the diagnostic portion of the study to a nadir of 67%. - The patient snored with moderate snoring volume during the diagnostic portion of the study. - EKG findings include PVCs. - Clinically significant periodic limb movements of sleep did not occur during the study.  DIAGNOSIS -  Obstructive Sleep Apnea (G47.33) - Snoring - Nocturnal hypoxemia  RECOMMENDATIONS - Recommend an initial trial  of BiPAP Auto therapy with EPAP min of 16, PS of 4 and IPAP max of 25 cm H2O with heated humidification. A Medium size Fisher&Paykel Full Face Vitera mask was used for the titration. - Effort should be made to optimize nasal and oropharyngeal patency. - Avoid alcohol, sedatives and other CNS depressants that may worsen sleep apnea and disrupt normal sleep architecture. - Sleep hygiene should be reviewed to assess factors that may improve sleep quality. - Weight management and regular exercise should be initiated or continued. - Recommned a download and sleep clinic evaluation after 4 weeks of therapy.  [Electronically signed] 10/05/2022 05:46 PM  Shelva Majestic MD, [facility], [place], American Board of Sleep Medicine  NPI: [id number]  [place] PH: [phone]   FX: [phone] [place]

## 2022-10-06 ENCOUNTER — Encounter: Payer: Self-pay | Admitting: Physical Therapy

## 2022-10-06 ENCOUNTER — Ambulatory Visit: Payer: 59 | Admitting: Physical Therapy

## 2022-10-06 VITALS — BP 114/59 | HR 68

## 2022-10-06 DIAGNOSIS — R2681 Unsteadiness on feet: Secondary | ICD-10-CM

## 2022-10-06 DIAGNOSIS — R2689 Other abnormalities of gait and mobility: Secondary | ICD-10-CM

## 2022-10-06 DIAGNOSIS — M6281 Muscle weakness (generalized): Secondary | ICD-10-CM

## 2022-10-06 NOTE — Therapy (Signed)
OUTPATIENT PHYSICAL THERAPY NEURO TREATMENT   Patient Name: Christopher Morales MRN: [id number] DOB:04/02/1971, 51 y.o., male Today's Date: 10/06/2022   PCP: Leotis Pain, MD REFERRING PROVIDER: Leotis Pain, MD  END OF SESSION:  PT End of Session - 10/06/22 1405     Visit Number 6    Number of Visits 13   with eval   Date for PT Re-Evaluation 11/10/22   to allow for scheduling conflicts   Authorization Type [employer]    Progress Note Due on Visit 10    PT Start Time 1402    PT Stop Time 1444    PT Time Calculation (min) 42 min    Equipment Utilized During Treatment Gait belt    Activity Tolerance Patient tolerated treatment well    Behavior During Therapy WFL for tasks assessed/performed               Past Medical History:  Diagnosis Date   CHF (congestive heart failure) ([place])    Hypertension    Renal disorder    History reviewed. No pertinent surgical history. Patient Active Problem List   Diagnosis Date Noted   OSA (obstructive sleep apnea) 09/24/2022   Chronic combined systolic and diastolic congestive heart failure ([place]) 09/24/2022   Nocturnal hypoxia 09/24/2022    ONSET DATE: 09/10/2022  REFERRING DIAG: M25.519 (ICD-10-CM) - Pain in unspecified shoulder G89.29 (ICD-10-CM) - Chronic pain R26.89 (ICD-10-CM) - Other abnormalities of gait and mobility R29.898 (ICD-10-CM) - Other symptoms and signs involving the musculoskeletal system Z86.73 (ICD-10-CM) - History of stroke  THERAPY DIAG:  Muscle weakness (generalized)  Other abnormalities of gait and mobility  Unsteadiness on feet  Rationale for Evaluation and Treatment: Rehabilitation  SUBJECTIVE:                                                                                                                                                                                             SUBJECTIVE STATEMENT: Pt states he feels he has some hip weakness and thinks this contributed to his thigh tightness he noted  last session.  With the strengthening exercises he is able to notice more difference side-to-side in his weakness.    Pt accompanied by: self  PERTINENT HISTORY: chronic systolic heart failure, hx of MI, heart failure, cardiac cath April 2023 and stent, HTN, renal disorder, CVA March 2023  PAIN:  Are you having pain? No  PRECAUTIONS: Fall  WEIGHT BEARING RESTRICTIONS: No  OBJECTIVE:    TODAY'S TREATMENT:           Vitals:   10/06/22 1410  BP: (!) 114/59  Pulse: 68  Assessed STGs: -Verbally reviewed HEP, pt states they remain challenging. -10MWT w/ RW:  12.59 sec = 0.79 m/sec OR 2.62 ft/sec -6MWT w/ RW:  508' continuously -BERG:  [facility] PT Assessment - 10/06/22 1428       Berg Balance Test   Sit to Stand Able to stand without using hands and stabilize independently    Standing Unsupported Able to stand safely 2 minutes    Sitting with Back Unsupported but Feet Supported on Floor or Stool Able to sit safely and securely 2 minutes    Stand to Sit Sits safely with minimal use of hands    Transfers Able to transfer safely, definite need of hands    Standing Unsupported with Eyes Closed Able to stand 10 seconds safely    Standing Unsupported with Feet Together Able to place feet together independently and stand for 1 minute with supervision    From Standing, Reach Forward with Outstretched Arm Can reach forward >12 cm safely (5")    From Standing Position, Pick up Object from Floor Able to pick up shoe, needs supervision    From Standing Position, Turn to Look Behind Over each Shoulder Looks behind one side only/other side shows less weight shift   less weight shift right   Turn 360 Degrees Needs close supervision or verbal cueing   pt has LOB w/ supervision and cues during recovery turning to right   Standing Unsupported, Alternately Place Feet on Step/Stool Able to complete >2 steps/needs minimal assist    Standing Unsupported, One Foot in Front Able to take small step  independently and hold 30 seconds    Standing on One Leg Tries to lift leg/unable to hold 3 seconds but remains standing independently    Total Score 40    Berg comment: Significant fall risk            PATIENT EDUCATION: Education details: Progress towards goals. Person educated: Patient Education method: Explanation, Demonstration, and Handouts Education comprehension: verbalized understanding, returned demonstration, and needs further education  HOME EXERCISE PROGRAM: Access Code: [email or web address] URL: [email or web address] Date: 09/22/2022 Prepared by: Elease Etienne  Exercises - Sit to Stand  - 1 x daily - 7 x weekly - 3 sets - 5 reps - Mini Squat with Counter Support  - 1 x daily - 7 x weekly - 3 sets - 10 reps - Heel Raises with Counter Support  - 1 x daily - 7 x weekly - 3 sets - 10 reps - Supine Bridge  - 1 x daily - 7 x weekly - 3 sets - 10 reps - Supine Single Bent Knee Fallout  - 1 x daily - 7 x weekly - 3 sets - 10 reps - Supine Single Knee to Chest Stretch  - 1 x daily - 7 x weekly - 2 sets - 5 reps - 30-60 sec hold - Supine Piriformis Stretch with Leg Straight  - 1 x daily - 7 x weekly - 2 sets - 5 reps - 30-60 sec hold  GOALS: Goals reviewed with patient? Yes  SHORT TERM GOALS: Target date: 10/06/2022  Pt will be independent with initial HEP for improved strength, balance, transfers and gait. Baseline:  Established, recently updated, and pt reports compliance. Goal status: MET  2.  Pt will improve gait velocity to at least 2.0 ft/sec for improved gait efficiency and performance at mod I level  Baseline: 1.79 ft/sec (11/20); 2.62 ft/sec modI w/ RW (12/11) Goal status: MET  3.  Pt will ambulate >/=  550 feet on 6MWT to demonstrate improved functional endurance for home and community participation. Baseline: 465' w/ RW (11/27); 508' w/ RW (12/11) Goal status: IN PROGRESS  4.  Pt will increase BERG balance score to 44/56 to demonstrate improved  static balance. Baseline: 38/56 (11/27); 40/56 (12/11) Goal status: IN PROGRESS   LONG TERM GOALS: Target date: 10/27/2022  Pt will be independent with final HEP for improved strength, balance, transfers and gait. Baseline:  Goal status: INITIAL  2.  Pt will improve gait velocity to at least 3.0 ft/sec for improved gait efficiency and performance at mod I level  Baseline: 1.79 ft/sec (11/20); 2.62 ft/sec modI w/ RW (12/11) Goal status: REVISED  3.  Pt will improve normal TUG to less than or equal to 15 seconds for improved functional mobility and decreased fall risk. Baseline: 21.19 sec with RW (11/20) Goal status: INITIAL  4.  Pt will ambulate >650 feet on 6MWT to demonstrate improved functional endurance for home and community participation. Baseline: 465' w/ RW (11/27) Goal status: INITIAL  5.  Pt will increase BERG balance score to 48/56 to demonstrate improved static balance. Baseline: 38/56 (11/27) Goal status: INITIAL  6.  Pt will ambulate up and down 12 stairs w/ reciprocal gait at mod I level using rail and cane as needed to improve safe access to home and community environments. Baseline: 4 stairs step to w/ BUE on left rail Goal status: INITIAL  ASSESSMENT:  CLINICAL IMPRESSION: Focus of skilled session on assessing short term goals with pt progressing towards all and meeting 2 of 4.  His BERG improved just shy of goal level to 40/56 and he ambulated 508' in six minutes indicating improved endurance.  His walking speed has improved to 2.62 ft/sec w/ use of RW for safety and he is compliant to established HEP.  He continues to benefit from skilled PT to further progress towards LTGs.  OBJECTIVE IMPAIRMENTS: Abnormal gait, cardiopulmonary status limiting activity, decreased activity tolerance, decreased balance, decreased endurance, decreased strength, increased edema, impaired perceived functional ability, and impaired sensation.   ACTIVITY LIMITATIONS: carrying,  lifting, bending, squatting, stairs, and reach over head  PARTICIPATION LIMITATIONS: driving, community activity, occupation, and church  PERSONAL FACTORS: chronic systolic heart failure, hx of MI, heart failure, cardiac cath April 2023 and stent, HTN, renal disorder, CVA March 2023 are also affecting patient's functional outcome.   REHAB POTENTIAL: Good  CLINICAL DECISION MAKING: Stable/uncomplicated  EVALUATION COMPLEXITY: Moderate  PLAN:  PT FREQUENCY: 2x/week  PT DURATION: 6 weeks  PLANNED INTERVENTIONS: Therapeutic exercises, Therapeutic activity, Neuromuscular re-education, Balance training, Gait training, Patient/Family education, Self Care, Joint mobilization, Stair training, Vestibular training, Canalith repositioning, Visual/preceptual remediation/compensation, Orthotic/Fit training, DME instructions, Aquatic Therapy, Dry Needling, Electrical stimulation, Cryotherapy, Moist heat, Taping, Manual therapy, and Re-evaluation  PLAN FOR NEXT SESSION:  L quad control, LLE NMR and strengthening, work towards floor transfers if safe and able,Try curb step with RW and or/SPC, compare gait with RW vs with SPC, add to HEP for strengthening/ROM/balance  Bary Richard, PT, DPT 10/06/2022, 3:21 PM

## 2022-10-08 ENCOUNTER — Telehealth: Payer: Self-pay | Admitting: *Deleted

## 2022-10-08 NOTE — Telephone Encounter (Signed)
Patient notified of sleep study results and recommendations. He agrees to proceed with BIPAP therapy. Patient informed me when notified of low O2 sat shown on sleep study that he uses 1-2L of O2 nightly, however he did not take it with him to do the sleep study. Dr Tresa Endo will be informed the patient is on oxygen and get orders to bleed his O2 through the BIPAP machine. All of the patients questions were answered to his satisfaction.

## 2022-10-08 NOTE — Telephone Encounter (Signed)
-----   Message from Lennette Bihari, MD sent at 10/05/2022  5:53 PM EST ----- Burna Mortimer, please notify pt and set up BiPAP with DME

## 2022-10-09 ENCOUNTER — Encounter: Payer: Self-pay | Admitting: Occupational Therapy

## 2022-10-09 ENCOUNTER — Telehealth: Payer: Self-pay | Admitting: *Deleted

## 2022-10-09 ENCOUNTER — Ambulatory Visit: Payer: 59 | Admitting: Physical Therapy

## 2022-10-09 ENCOUNTER — Ambulatory Visit: Payer: 59 | Admitting: Occupational Therapy

## 2022-10-09 DIAGNOSIS — M25612 Stiffness of left shoulder, not elsewhere classified: Secondary | ICD-10-CM

## 2022-10-09 DIAGNOSIS — M6281 Muscle weakness (generalized): Secondary | ICD-10-CM

## 2022-10-09 DIAGNOSIS — G8929 Other chronic pain: Secondary | ICD-10-CM

## 2022-10-09 DIAGNOSIS — R2689 Other abnormalities of gait and mobility: Secondary | ICD-10-CM

## 2022-10-09 DIAGNOSIS — R2681 Unsteadiness on feet: Secondary | ICD-10-CM

## 2022-10-09 DIAGNOSIS — R208 Other disturbances of skin sensation: Secondary | ICD-10-CM

## 2022-10-09 NOTE — Therapy (Signed)
OUTPATIENT PHYSICAL THERAPY NEURO TREATMENT   Patient Name: Christopher Morales MRN: [id number] DOB:[birth year], 51 y.o., male Today's Date: 10/09/2022   PCP: Leotis Pain, MD REFERRING PROVIDER: Leotis Pain, MD  END OF SESSION:  PT End of Session - 10/09/22 1400     Visit Number 7    Number of Visits 13   with eval   Date for PT Re-Evaluation 11/10/22   to allow for scheduling conflicts   Authorization Type [employer]    Progress Note Due on Visit 10    PT Start Time 1400    PT Stop Time 1448    PT Time Calculation (min) 48 min    Equipment Utilized During Treatment Gait belt    Activity Tolerance Patient tolerated treatment well    Behavior During Therapy WFL for tasks assessed/performed                Past Medical History:  Diagnosis Date   CHF (congestive heart failure) ([place])    Hypertension    Renal disorder    No past surgical history on file. Patient Active Problem List   Diagnosis Date Noted   OSA (obstructive sleep apnea) 09/24/2022   Chronic combined systolic and diastolic congestive heart failure ([place]) 09/24/2022   Nocturnal hypoxia 09/24/2022    ONSET DATE: 09/10/2022  REFERRING DIAG: M25.519 (ICD-10-CM) - Pain in unspecified shoulder G89.29 (ICD-10-CM) - Chronic pain R26.89 (ICD-10-CM) - Other abnormalities of gait and mobility R29.898 (ICD-10-CM) - Other symptoms and signs involving the musculoskeletal system Z86.73 (ICD-10-CM) - History of stroke  THERAPY DIAG:  Muscle weakness (generalized)  Other abnormalities of gait and mobility  Unsteadiness on feet  Rationale for Evaluation and Treatment: Rehabilitation  SUBJECTIVE:                                                                                                                                                                                             SUBJECTIVE STATEMENT: Pt reports he has been sore from exercising but no complaints of pain. No acute changes and no falls. Pt got the results  of his sleep study and they are recommending he wear a Bipap at night with supplemental O2.  Pt accompanied by: self  PERTINENT HISTORY: chronic systolic heart failure, hx of MI, heart failure, cardiac cath April 2023 and stent, HTN, renal disorder, CVA March 2023  PAIN:  Are you having pain? No  PRECAUTIONS: Fall  WEIGHT BEARING RESTRICTIONS: No  OBJECTIVE:    TODAY'S TREATMENT:           There were no vitals filed for this visit.  THER EX:  SciFit multi-peaks level 3 for 8 minutes using BUE/BLEs for neural priming for reciprocal movement, dynamic cardiovascular warmup and increased amplitude of stepping. RPE of 9/10 following activity. SpO2 95% on RA following activity.  Supine hip PROM bilaterally to address ongoing hip tightness prior to gait.  In // bars: Forward step with opp LE march x 10 reps BUE support and SBA for balance  At mat table: Attempted forward/back step over 4" hurdle Difficulty with retro stepping Regressed to forward/back step over yardstick 2 x 10 reps with min A for balance   GAIT: Gait pattern: decreased arm swing- Left, decreased hip/knee flexion- Right, and decreased hip/knee flexion- Left Distance walked: 2 x 115 ft Assistive device utilized: Single point cane Level of assistance: SBA Comments: pt exhibits ongoing tightness in his hips with decreased hip and knee flexion noted during gait; initial lap performed prior to PROM, pt does exhibit some improvement following stretching during 2nd lap   PATIENT EDUCATION: Education details: continue HEP Person educated: Patient Education method: Customer service manager Education comprehension: verbalized understanding, returned demonstration, and needs further education  HOME EXERCISE PROGRAM: Access Code: [facility] URL: [email or web address] Date: 09/22/2022 Prepared by: Elease Etienne  Exercises - Sit to Stand  - 1 x daily - 7 x weekly - 3 sets - 5 reps - Mini Squat  with Counter Support  - 1 x daily - 7 x weekly - 3 sets - 10 reps - Heel Raises with Counter Support  - 1 x daily - 7 x weekly - 3 sets - 10 reps - Supine Bridge  - 1 x daily - 7 x weekly - 3 sets - 10 reps - Supine Single Bent Knee Fallout  - 1 x daily - 7 x weekly - 3 sets - 10 reps - Supine Single Knee to Chest Stretch  - 1 x daily - 7 x weekly - 2 sets - 5 reps - 30-60 sec hold - Supine Piriformis Stretch with Leg Straight  - 1 x daily - 7 x weekly - 2 sets - 5 reps - 30-60 sec hold  GOALS: Goals reviewed with patient? Yes  SHORT TERM GOALS: Target date: 10/06/2022  Pt will be independent with initial HEP for improved strength, balance, transfers and gait. Baseline:  Established, recently updated, and pt reports compliance. Goal status: MET  2.  Pt will improve gait velocity to at least 2.0 ft/sec for improved gait efficiency and performance at mod I level  Baseline: 1.79 ft/sec (11/20); 2.62 ft/sec modI w/ RW (12/11) Goal status: MET  3.  Pt will ambulate >/=550 feet on 6MWT to demonstrate improved functional endurance for home and community participation. Baseline: 465' w/ RW (11/27); 508' w/ RW (12/11) Goal status: IN PROGRESS  4.  Pt will increase BERG balance score to 44/56 to demonstrate improved static balance. Baseline: 38/56 (11/27); 40/56 (12/11) Goal status: IN PROGRESS   LONG TERM GOALS: Target date: 10/27/2022  Pt will be independent with final HEP for improved strength, balance, transfers and gait. Baseline:  Goal status: INITIAL  2.  Pt will improve gait velocity to at least 3.0 ft/sec for improved gait efficiency and performance at mod I level  Baseline: 1.79 ft/sec (11/20); 2.62 ft/sec modI w/ RW (12/11) Goal status: REVISED  3.  Pt will improve normal TUG to less than or equal to 15 seconds for improved functional mobility and decreased fall risk. Baseline: 21.19 sec with RW (11/20) Goal status: INITIAL  4.  Pt will ambulate >650 feet  on 6MWT to  demonstrate improved functional endurance for home and community participation. Baseline: 465' w/ RW (11/27) Goal status: INITIAL  5.  Pt will increase BERG balance score to 48/56 to demonstrate improved static balance. Baseline: 38/56 (11/27) Goal status: INITIAL  6.  Pt will ambulate up and down 12 stairs w/ reciprocal gait at mod I level using rail and cane as needed to improve safe access to home and community environments. Baseline: 4 stairs step to w/ BUE on left rail Goal status: INITIAL  ASSESSMENT:  CLINICAL IMPRESSION: Emphasis of skilled PT session on continuing to work towards improving endurance, working on increasing B hip mobility, working on increasing B hip strength, and working towards gait with LRAD. Pt continues to exhibit significant B hip tightness as well as ongoing hip flexor and knee flexor weakness leading to above-noted gait impairments. Pt continues to benefit from skilled therapy services to address this to increase safety and independence with functional mobility. Continue POC.   OBJECTIVE IMPAIRMENTS: Abnormal gait, cardiopulmonary status limiting activity, decreased activity tolerance, decreased balance, decreased endurance, decreased strength, increased edema, impaired perceived functional ability, and impaired sensation.   ACTIVITY LIMITATIONS: carrying, lifting, bending, squatting, stairs, and reach over head  PARTICIPATION LIMITATIONS: driving, community activity, occupation, and church  PERSONAL FACTORS: chronic systolic heart failure, hx of MI, heart failure, cardiac cath April 2023 and stent, HTN, renal disorder, CVA March 2023 are also affecting patient's functional outcome.   REHAB POTENTIAL: Good  CLINICAL DECISION MAKING: Stable/uncomplicated  EVALUATION COMPLEXITY: Moderate  PLAN:  PT FREQUENCY: 2x/week  PT DURATION: 6 weeks  PLANNED INTERVENTIONS: Therapeutic exercises, Therapeutic activity, Neuromuscular re-education, Balance  training, Gait training, Patient/Family education, Self Care, Joint mobilization, Stair training, Vestibular training, Canalith repositioning, Visual/preceptual remediation/compensation, Orthotic/Fit training, DME instructions, Aquatic Therapy, Dry Needling, Electrical stimulation, Cryotherapy, Moist heat, Taping, Manual therapy, and Re-evaluation  PLAN FOR NEXT SESSION:  L quad control, LLE NMR and strengthening, work towards floor transfers if safe and able,Try curb step with RW and or/SPC, compare gait with RW vs with SPC, add to HEP for strengthening/ROM/balance, add in step taps or other hip flexor strengthening to HEP  Excell Seltzer, PT, DPT, CSRS 10/09/2022, 2:50 PM

## 2022-10-09 NOTE — Telephone Encounter (Signed)
BIPAP order faxed to [facility]. Phone # [phone]. Fax # [phone].

## 2022-10-09 NOTE — Therapy (Signed)
OUTPATIENT OCCUPATIONAL THERAPY NEURO EVALUATION  Patient Name: Christopher Morales MRN: [id number] DOB:[birth year], 51 y.o., male Today's Date: 10/09/2022  PCP:  Wilfred Curtis, MD REFERRING PROVIDER:  Wilfred Curtis, MD  END OF SESSION:  OT End of Session - 10/09/22 1754     Visit Number 1    Number of Visits 17    Authorization Type [employer] 30 VL    OT Start Time 1530    OT Stop Time 1615    OT Time Calculation (min) 45 min    Activity Tolerance Patient tolerated treatment well    Behavior During Therapy WFL for tasks assessed/performed             Past Medical History:  Diagnosis Date   CHF (congestive heart failure) (HCC)    Hypertension    Renal disorder    History reviewed. No pertinent surgical history. Patient Active Problem List   Diagnosis Date Noted   OSA (obstructive sleep apnea) 09/24/2022   Chronic combined systolic and diastolic congestive heart failure (HCC) 09/24/2022   Nocturnal hypoxia 09/24/2022    ONSET DATE: 09/10/2022  REFERRING DIAG: M25.519 (ICD-10-CM) - Pain in unspecified shoulder G89.29 (ICD-10-CM) - Chronic pain R26.89 (ICD-10-CM) - Other abnormalities of gait and mobility R29.898 (ICD-10-CM) - Other symptoms and signs involving the musculoskeletal system Z86.73 (ICD-10-CM) - History of stroke    THERAPY DIAG:  Muscle weakness (generalized)  Stiffness of left shoulder, not elsewhere classified  Chronic left shoulder pain  Unsteadiness on feet  Other disturbances of skin sensation  Rationale for Evaluation and Treatment: Rehabilitation  SUBJECTIVE:   SUBJECTIVE STATEMENT: I want to be more functional again. Would love to get back to work.   Pt accompanied by: self  PERTINENT HISTORY: Patient is a 51 year old male referred to Neuro OPPT for CVA.   Pt's PMH is significant for: chronic systolic heart failure, hx of MI, heart failure, cardiac cath April 2023 and stent, HTN, renal disorder, CVA March 2023. The following deficits were  present during the exam: decreased gait speed, decreased LE strength, and increased fall risk. Based on his history of falls, gait speed of 1.79 ft/sec, and TUG score of 21.19 sec, pt is an increased risk for falls. Pt would benefit from skilled PT to address these impairments and functional limitations to maximize functional mobility independence  PRECAUTIONS: Fall  WEIGHT BEARING RESTRICTIONS: No  PAIN:  Are you having pain? No  FALLS: Has patient fallen in last 6 months? Yes. Number of falls 1  LIVING ENVIRONMENT: Lives with: lives with their family Lives in: House/apartment Stairs: Yes: External: 3 steps; on left going up Has following equipment at home: Single point cane, Walker - 2 wheeled, Wheelchair (manual), Shower bench, and bed side commode  PLOF: Independent with basic ADLs and Independent with household mobility with device  PATIENT GOALS: return to work, regain arm function  OBJECTIVE:   HAND DOMINANCE: Right  ADLs: Overall ADLs: Modified Independent Transfers/ambulation related to ADLs: Eating: mod I Grooming: mod I UB Dressing: mod I LB Dressing: mod I Toileting: mod I Bathing: mod I Tub Shower transfers: has tub transfer bench, and toilet surround Equipment: Transfer tub bench and bed side commode  IADLs:  Light housekeeping: Is doing laundry  Meal Prep: Is not cooking Community mobility: Is not driving   POSTURE COMMENTS:  rounded shoulders, forward head, and posterior pelvic tilt   ACTIVITY TOLERANCE: Activity tolerance: Mild impairment   UPPER EXTREMITY ROM:    Active  ROM Right eval Left eval  Shoulder flexion WFL AROM 80/ PROM110  Shoulder abduction  70  Shoulder adduction    Shoulder extension    Shoulder internal rotation    Shoulder external rotation    Elbow flexion  -25  Elbow extension  -10  Wrist flexion    Wrist extension  30  Wrist ulnar deviation    Wrist radial deviation    Wrist pronation    Wrist supination     (Blank rows = not tested)  UPPER EXTREMITY MMT:     MMT Right eval Left eval  Shoulder flexion 4+/5 NT  Shoulder abduction Thruout   Shoulder adduction    Shoulder extension    Shoulder internal rotation    Shoulder external rotation    Middle trapezius    Lower trapezius    Elbow flexion    Elbow extension    Wrist flexion    Wrist extension    Wrist ulnar deviation    Wrist radial deviation    Wrist pronation    Wrist supination    (Blank rows = not tested)  HAND FUNCTION: Grip strength: Right: 76 lbs; Left: 26 lbs   COORDINATION: 9 Hole Peg test: Right: 35 sec; Left: 41min52  sec  SENSATION: Light touch: Impaired  Proprioception: Impaired     MUSCLE TONE: LUE: Moderate and Hypertonic  COGNITION: Overall cognitive status: Impaired  VISION: Subjective report: Reports having nystagmus initially Baseline vision: No visual deficits Visual history:  NA  VISION ASSESSMENT: WFL    PERCEPTION: Impaired: Inattention/neglect: does not attend to left side of body  PRAXIS: WFL    TODAY'S TREATMENT:                                                                                                                              DATE: 10/09/22  Reviewed eval results and potential goals for therapy.  Patient eager to move into his grandparents home for increased space and independence.  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    PATIENT EDUCATION: Education details: eval results/ goals Person educated: Patient Education method: Explanation Education comprehension: needs further education  HOME EXERCISE PROGRAM: Patient will bring HEP from [facility] therapy next visit   GOALS: Goals reviewed with patient? No  SHORT TERM GOALS: Target date: 11/09/22  Patient will complete an HEP designed to improve range of motion in left shoulder  Goal status: INITIAL  2.  Patient will demonstrate effectively low reach to obtain and replace a lightweight  (less than 2lb) object from counter height in standing  Goal status: INITIAL  3.  Patient will demonstrate at least 100 degrees of active shoulder flexion in supine as preparation for mid level reach  Goal status: INITIAL  4.  Patient will report pain no greater than 2/10 with passive range of motion to 120 degrees of shoulder flexion  Goal status: INITIAL    LONG TERM GOALS:  Target date: 12/10/22  Patient will complete HEP designed to improve Left grip strength  Goal status: INITIAL  2.  Patient will demonstrate at least 90* of active shoulder flexion in upright position without lateral trunk flexion or excessive scapular elevation in preparation for mid level reach  Goal status: INITIAL  3.  Patient will demonstrate at least a 5 lb increase in left grip strength Baseline: 26lb Goal status: INITIAL  4.  Patient will demonstrate improved left coordination as evidenced by decreased time on 9 hole peg test to 1 min 30 sec or less.  Baseline: 52 sec Goal status: INITIAL  5.  Patient will shower with modified independence  Goal status: INITIAL  6.  Patient will prepare simple meal with modified independence  Goal status: INITIAL  ASSESSMENT:  CLINICAL IMPRESSION: Patient is a 51 y.o. male who was seen today for occupational therapy evaluation for shoulder pain s/p stroke.  Patient with a complicated medical history.   PERFORMANCE DEFICITS: in functional skills including ADLs, IADLs, coordination, dexterity, proprioception, sensation, tone, ROM, strength, pain, fascial restrictions, flexibility, Fine motor control, Gross motor control, mobility, balance, body mechanics, and UE functional use, cognitive skills including attention IMPAIRMENTS: are limiting patient from ADLs, IADLs, and work.   CO-MORBIDITIES: may have co-morbidities  that affects occupational performance. Patient will benefit from skilled OT to address above impairments and improve overall  function.  MODIFICATION OR ASSISTANCE TO COMPLETE EVALUATION: Min-Moderate modification of tasks or assist with assess necessary to complete an evaluation.  OT OCCUPATIONAL PROFILE AND HISTORY: Detailed assessment: Review of records and additional review of physical, cognitive, psychosocial history related to current functional performance.  CLINICAL DECISION MAKING: Moderate - several treatment options, min-mod task modification necessary  REHAB POTENTIAL: Good  EVALUATION COMPLEXITY: Moderate    PLAN:  OT FREQUENCY: 2x/week  OT DURATION: 8 weeks  PLANNED INTERVENTIONS: self care/ADL training, therapeutic exercise, therapeutic activity, neuromuscular re-education, manual therapy, passive range of motion, balance training, functional mobility training, aquatic therapy, cognitive remediation/compensation, visual/perceptual remediation/compensation, and DME and/or AE instructions  RECOMMENDED OTHER SERVICES: NA  CONSULTED AND AGREED WITH PLAN OF CARE: Patient  PLAN FOR NEXT SESSION: Review HEP from [facility].  Start supine stretching program LUE - May benefit from body on arm motion   Collier Salina, OT 10/09/2022, 5:56 PM

## 2022-10-09 NOTE — Addendum Note (Signed)
Addended by: Collier Salina on: 10/09/2022 06:12 PM   Modules accepted: Orders

## 2022-10-13 ENCOUNTER — Ambulatory Visit: Payer: 59 | Admitting: Physical Therapy

## 2022-10-13 DIAGNOSIS — R2681 Unsteadiness on feet: Secondary | ICD-10-CM

## 2022-10-13 DIAGNOSIS — M6281 Muscle weakness (generalized): Secondary | ICD-10-CM | POA: Diagnosis not present

## 2022-10-13 DIAGNOSIS — R2689 Other abnormalities of gait and mobility: Secondary | ICD-10-CM

## 2022-10-13 NOTE — Therapy (Signed)
OUTPATIENT PHYSICAL THERAPY NEURO TREATMENT   Patient Name: Christopher Morales MRN: [id number] DOB:01-Jan-1971, 51 y.o., male Today's Date: 10/13/2022   PCP: Leotis Pain, MD REFERRING PROVIDER: Leotis Pain, MD  END OF SESSION:  PT End of Session - 10/13/22 1400     Visit Number 8    Number of Visits 13   with eval   Date for PT Re-Evaluation 11/10/22   to allow for scheduling conflicts   Authorization Type [employer]    Progress Note Due on Visit 10    PT Start Time 1400    PT Stop Time 1446    PT Time Calculation (min) 46 min    Equipment Utilized During Treatment Gait belt    Activity Tolerance Patient tolerated treatment well    Behavior During Therapy WFL for tasks assessed/performed                 Past Medical History:  Diagnosis Date   CHF (congestive heart failure) ([place])    Hypertension    Renal disorder    No past surgical history on file. Patient Active Problem List   Diagnosis Date Noted   OSA (obstructive sleep apnea) 09/24/2022   Chronic combined systolic and diastolic congestive heart failure ([place]) 09/24/2022   Nocturnal hypoxia 09/24/2022    ONSET DATE: 09/10/2022  REFERRING DIAG: M25.519 (ICD-10-CM) - Pain in unspecified shoulder G89.29 (ICD-10-CM) - Chronic pain R26.89 (ICD-10-CM) - Other abnormalities of gait and mobility R29.898 (ICD-10-CM) - Other symptoms and signs involving the musculoskeletal system Z86.73 (ICD-10-CM) - History of stroke  THERAPY DIAG:  Muscle weakness (generalized)  Unsteadiness on feet  Other abnormalities of gait and mobility  Rationale for Evaluation and Treatment: Rehabilitation  SUBJECTIVE:                                                                                                                                                                                             SUBJECTIVE STATEMENT: Pt reports he is doing fine today, no acute changes since last session. No pain today, just soreness from exercising.  Pt reports he has been doing the HEP at home as able. Pt reports he really feels the hip stretches, has difficulty with bridges due to his bed being soft.  Pt accompanied by: self  PERTINENT HISTORY: chronic systolic heart failure, hx of MI, heart failure, cardiac cath April 2023 and stent, HTN, renal disorder, CVA March 2023  PAIN:  Are you having pain? No  PRECAUTIONS: Fall  WEIGHT BEARING RESTRICTIONS: No  OBJECTIVE:    TODAY'S TREATMENT:  There were no vitals filed for this visit.   GAIT: Gait pattern: decreased hip/knee flexion- Left, decreased ankle dorsiflexion- Left, and lateral lean- Right Distance walked: 115 ft Assistive device utilized: Single point cane Level of assistance: CGA Comments: trial gait with SPC  Gait pattern: decreased hip/knee flexion- Left, decreased ankle dorsiflexion- Left, and lateral lean- Right Distance walked: 115 ft Assistive device utilized: Quad cane small base Level of assistance: CGA Comments: trial gait with SBQC "hurricane", decrease in deviations compared to use of SPC and increased gait speed   THER ACT: At bottom of stairs: Resisted step taps on 6" steps with RTB 3 x 10-15 reps B  In // bars: 4" hurdle step-overs with alt LE 2 x 10 reps (difficulty clearing LLE) 2" foam hurdle step-overs with LLE x 15 reps   PATIENT EDUCATION: Education details: continue HEP Person educated: Patient Education method: Customer service manager Education comprehension: verbalized understanding, returned demonstration, and needs further education  HOME EXERCISE PROGRAM: Access Code: [facility] URL: [email or web address] Date: 09/22/2022 Prepared by: Elease Etienne  Exercises - Sit to Stand  - 1 x daily - 7 x weekly - 3 sets - 5 reps - Mini Squat with Counter Support  - 1 x daily - 7 x weekly - 3 sets - 10 reps - Heel Raises with Counter Support  - 1 x daily - 7 x weekly - 3 sets - 10 reps - Supine Bridge   - 1 x daily - 7 x weekly - 3 sets - 10 reps - Supine Single Bent Knee Fallout  - 1 x daily - 7 x weekly - 3 sets - 10 reps - Supine Single Knee to Chest Stretch  - 1 x daily - 7 x weekly - 2 sets - 5 reps - 30-60 sec hold - Supine Piriformis Stretch with Leg Straight  - 1 x daily - 7 x weekly - 2 sets - 5 reps - 30-60 sec hold  GOALS: Goals reviewed with patient? Yes  SHORT TERM GOALS: Target date: 10/06/2022  Pt will be independent with initial HEP for improved strength, balance, transfers and gait. Baseline:  Established, recently updated, and pt reports compliance. Goal status: MET  2.  Pt will improve gait velocity to at least 2.0 ft/sec for improved gait efficiency and performance at mod I level  Baseline: 1.79 ft/sec (11/20); 2.62 ft/sec modI w/ RW (12/11) Goal status: MET  3.  Pt will ambulate >/=550 feet on 6MWT to demonstrate improved functional endurance for home and community participation. Baseline: 465' w/ RW (11/27); 508' w/ RW (12/11) Goal status: IN PROGRESS  4.  Pt will increase BERG balance score to 44/56 to demonstrate improved static balance. Baseline: 38/56 (11/27); 40/56 (12/11) Goal status: IN PROGRESS   LONG TERM GOALS: Target date: 10/27/2022  Pt will be independent with final HEP for improved strength, balance, transfers and gait. Baseline:  Goal status: INITIAL  2.  Pt will improve gait velocity to at least 3.0 ft/sec for improved gait efficiency and performance at mod I level  Baseline: 1.79 ft/sec (11/20); 2.62 ft/sec modI w/ RW (12/11) Goal status: REVISED  3.  Pt will improve normal TUG to less than or equal to 15 seconds for improved functional mobility and decreased fall risk. Baseline: 21.19 sec with RW (11/20) Goal status: INITIAL  4.  Pt will ambulate >650 feet on 6MWT to demonstrate improved functional endurance for home and community participation. Baseline: 465' w/ RW (11/27) Goal status: INITIAL  5.  Pt will increase BERG balance  score to 48/56 to demonstrate improved static balance. Baseline: 38/56 (11/27) Goal status: INITIAL  6.  Pt will ambulate up and down 12 stairs w/ reciprocal gait at mod I level using rail and cane as needed to improve safe access to home and community environments. Baseline: 4 stairs step to w/ BUE on left rail Goal status: INITIAL  ASSESSMENT:  CLINICAL IMPRESSION: Emphasis of skilled PT session on continuing to work on L hip flexor strengthening and gait with LRAD. Pt continues to exhibit decreased L hip flexor strength with decreased endurance of musculature and ongoing compensations such as circumduction. Pt exhibits improved gait mechanics with use of "hurricane" SBQC vs use of SPC. Pt continues to benefit from skilled therapy services to address ongoing LLE weakness and decreased safety and independence with functional mobility. Continue POC.   OBJECTIVE IMPAIRMENTS: Abnormal gait, cardiopulmonary status limiting activity, decreased activity tolerance, decreased balance, decreased endurance, decreased strength, increased edema, impaired perceived functional ability, and impaired sensation.   ACTIVITY LIMITATIONS: carrying, lifting, bending, squatting, stairs, and reach over head  PARTICIPATION LIMITATIONS: driving, community activity, occupation, and church  PERSONAL FACTORS: chronic systolic heart failure, hx of MI, heart failure, cardiac cath April 2023 and stent, HTN, renal disorder, CVA March 2023 are also affecting patient's functional outcome.   REHAB POTENTIAL: Good  CLINICAL DECISION MAKING: Stable/uncomplicated  EVALUATION COMPLEXITY: Moderate  PLAN:  PT FREQUENCY: 2x/week  PT DURATION: 6 weeks  PLANNED INTERVENTIONS: Therapeutic exercises, Therapeutic activity, Neuromuscular re-education, Balance training, Gait training, Patient/Family education, Self Care, Joint mobilization, Stair training, Vestibular training, Canalith repositioning, Visual/preceptual  remediation/compensation, Orthotic/Fit training, DME instructions, Aquatic Therapy, Dry Needling, Electrical stimulation, Cryotherapy, Moist heat, Taping, Manual therapy, and Re-evaluation  PLAN FOR NEXT SESSION:  L quad control, LLE NMR and strengthening, work towards floor transfers if safe and able,Try curb step with RW and or/SPC, compare gait with RW vs with SPC, add to HEP for strengthening/ROM/balance, add in step taps or other hip flexor strengthening to HEP, try hurricane outdoors on uneven ground depending on weather, look at schedule for PT/OT for Jan (pt wants each one 1x/week to save on his visits--but on different days)  Excell Seltzer, PT, DPT, CSRS 10/13/2022, 2:47 PM

## 2022-10-16 ENCOUNTER — Ambulatory Visit: Payer: 59 | Admitting: Physical Therapy

## 2022-10-16 DIAGNOSIS — M6281 Muscle weakness (generalized): Secondary | ICD-10-CM

## 2022-10-16 DIAGNOSIS — R2681 Unsteadiness on feet: Secondary | ICD-10-CM

## 2022-10-16 DIAGNOSIS — R208 Other disturbances of skin sensation: Secondary | ICD-10-CM

## 2022-10-16 DIAGNOSIS — R2689 Other abnormalities of gait and mobility: Secondary | ICD-10-CM

## 2022-10-16 NOTE — Telephone Encounter (Signed)
Call made to [facility] ([employer] Spoke with Roselyn Meier to inquire about the patient's BIPAP order.  She informed me they have everything that is needed. They made a courtesy call to the patient this morning, which may have prompted his call to our office. The order was sent by me to them on 10/09/22. Roselyn Meier states that she will reach out to the patient with details.

## 2022-10-16 NOTE — Telephone Encounter (Signed)
Patient stated [facility] will need his sleep study information sent to them before they will process his request.  Patient stated he will need this information sent ASAP to fax# [phone], ATTN;  Scharlene Gloss.  Patient will need to complete this order before the end of the year.

## 2022-10-16 NOTE — Therapy (Signed)
OUTPATIENT PHYSICAL THERAPY NEURO TREATMENT   Patient Name: Christopher Morales MRN: [id number] DOB:10/19/1971, 51 y.o., male Today's Date: 10/16/2022   PCP: Leotis Pain, MD REFERRING PROVIDER: Leotis Pain, MD  END OF SESSION:  PT End of Session - 10/16/22 1354     Visit Number 9    Number of Visits 13   with eval   Date for PT Re-Evaluation 11/10/22   to allow for scheduling conflicts   Authorization Type [employer]    Progress Note Due on Visit 10    PT Start Time 1351    PT Stop Time 1448    PT Time Calculation (min) 57 min    Equipment Utilized During Treatment Gait belt    Activity Tolerance Patient tolerated treatment well    Behavior During Therapy WFL for tasks assessed/performed                  Past Medical History:  Diagnosis Date   CHF (congestive heart failure) ([place])    Hypertension    Renal disorder    No past surgical history on file. Patient Active Problem List   Diagnosis Date Noted   OSA (obstructive sleep apnea) 09/24/2022   Chronic combined systolic and diastolic congestive heart failure ([place]) 09/24/2022   Nocturnal hypoxia 09/24/2022    ONSET DATE: 09/10/2022  REFERRING DIAG: M25.519 (ICD-10-CM) - Pain in unspecified shoulder G89.29 (ICD-10-CM) - Chronic pain R26.89 (ICD-10-CM) - Other abnormalities of gait and mobility R29.898 (ICD-10-CM) - Other symptoms and signs involving the musculoskeletal system Z86.73 (ICD-10-CM) - History of stroke  THERAPY DIAG:  Muscle weakness (generalized)  Unsteadiness on feet  Other abnormalities of gait and mobility  Other disturbances of skin sensation  Rationale for Evaluation and Treatment: Rehabilitation  SUBJECTIVE:                                                                                                                                                                                             SUBJECTIVE STATEMENT: Pt reports feeling "worked out" after last PT session and was sore for a few  days but it has gotten better. Pt reports he feels like he is getting stronger, does have some ongoing tightness in B hamstrings (L>R).  Pt accompanied by: self  PERTINENT HISTORY: chronic systolic heart failure, hx of MI, heart failure, cardiac cath April 2023 and stent, HTN, renal disorder, CVA March 2023  PAIN:  Are you having pain? No  PRECAUTIONS: Fall  WEIGHT BEARING RESTRICTIONS: No  OBJECTIVE:    TODAY'S TREATMENT:           There were no vitals  filed for this visit.   GAIT: Gait pattern: decreased stride length, decreased hip/knee flexion- Left, decreased ankle dorsiflexion- Left, and narrow BOS Distance walked: 300 ft Assistive device utilized: Quad cane small base "hurricane" Level of assistance: CGA Comments: gait outdoors across uneven surface of sidewalk up/down inclines with CGA; decreased endurance with one standing rest break needed   THER EX: SciFit multi-peaks level 4 for 5 minutes using BUE/BLEs for neural priming for reciprocal movement, dynamic cardiovascular warmup and increased amplitude of stepping.  Attempted long-sitting hamstring stretch but pt unable to tolerate Seated hamstring stretch with gait belt 3 x 30 sec each Added to HEP, see bolded below  THER ACT: Discussed PT POC going forwards with transition to 1x/week due to patient's visit limit for the year as well as $50 copay starting in January. Pt wanting his PT and OT on different days of the week. Assisted pt with adjusting his schedule.   PATIENT EDUCATION: Education details: continue HEP, where to purchase hurricane Person educated: Patient Education method: Customer service manager Education comprehension: verbalized understanding, returned demonstration, and needs further education  HOME EXERCISE PROGRAM: Access Code: [facility] URL: [email or web address] Date: 09/22/2022 Prepared by: Elease Etienne  Exercises - Sit to Stand  - 1 x daily - 7 x weekly - 3  sets - 5 reps - Mini Squat with Counter Support  - 1 x daily - 7 x weekly - 3 sets - 10 reps - Heel Raises with Counter Support  - 1 x daily - 7 x weekly - 3 sets - 10 reps - Supine Bridge  - 1 x daily - 7 x weekly - 3 sets - 10 reps - Supine Single Bent Knee Fallout  - 1 x daily - 7 x weekly - 3 sets - 10 reps - Supine Single Knee to Chest Stretch  - 1 x daily - 7 x weekly - 2 sets - 5 reps - 30-60 sec hold - Supine Piriformis Stretch with Leg Straight  - 1 x daily - 7 x weekly - 2 sets - 5 reps - 30-60 sec hold - Seated Hamstring Stretch with Strap  - 1 x daily - 7 x weekly - 2 sets - 5 reps - 30-60 sec hold   GOALS: Goals reviewed with patient? Yes  SHORT TERM GOALS: Target date: 10/06/2022  Pt will be independent with initial HEP for improved strength, balance, transfers and gait. Baseline:  Established, recently updated, and pt reports compliance. Goal status: MET  2.  Pt will improve gait velocity to at least 2.0 ft/sec for improved gait efficiency and performance at mod I level  Baseline: 1.79 ft/sec (11/20); 2.62 ft/sec modI w/ RW (12/11) Goal status: MET  3.  Pt will ambulate >/=550 feet on 6MWT to demonstrate improved functional endurance for home and community participation. Baseline: 465' w/ RW (11/27); 508' w/ RW (12/11) Goal status: IN PROGRESS  4.  Pt will increase BERG balance score to 44/56 to demonstrate improved static balance. Baseline: 38/56 (11/27); 40/56 (12/11) Goal status: IN PROGRESS   LONG TERM GOALS: Target date: 10/27/2022  Pt will be independent with final HEP for improved strength, balance, transfers and gait. Baseline:  Goal status: INITIAL  2.  Pt will improve gait velocity to at least 3.0 ft/sec for improved gait efficiency and performance at mod I level  Baseline: 1.79 ft/sec (11/20); 2.62 ft/sec modI w/ RW (12/11) Goal status: REVISED  3.  Pt will improve normal TUG to less than or equal to  15 seconds for improved functional mobility and  decreased fall risk. Baseline: 21.19 sec with RW (11/20) Goal status: INITIAL  4.  Pt will ambulate >650 feet on 6MWT to demonstrate improved functional endurance for home and community participation. Baseline: 465' w/ RW (11/27) Goal status: INITIAL  5.  Pt will increase BERG balance score to 48/56 to demonstrate improved static balance. Baseline: 38/56 (11/27) Goal status: INITIAL  6.  Pt will ambulate up and down 12 stairs w/ reciprocal gait at mod I level using rail and cane as needed to improve safe access to home and community environments. Baseline: 4 stairs step to w/ BUE on left rail Goal status: INITIAL  ASSESSMENT:  CLINICAL IMPRESSION: Emphasis of skilled PT session on adding hamstring stretch to HEP due to tightness in these muscles, discussing PT POC going forwards, and continuing to work on gait with SBQC (hurricane) to transition to a less restrictive device. Pt continues to exhibit decreased LLE strength leading to decreased hip and knee flexion during gait. Pt continues to benefit from skilled therapy services to address ongoing decreased LE strength, decreased balance, and increased fall risk. Continue POC.   OBJECTIVE IMPAIRMENTS: Abnormal gait, cardiopulmonary status limiting activity, decreased activity tolerance, decreased balance, decreased endurance, decreased strength, increased edema, impaired perceived functional ability, and impaired sensation.   ACTIVITY LIMITATIONS: carrying, lifting, bending, squatting, stairs, and reach over head  PARTICIPATION LIMITATIONS: driving, community activity, occupation, and church  PERSONAL FACTORS: chronic systolic heart failure, hx of MI, heart failure, cardiac cath April 2023 and stent, HTN, renal disorder, CVA March 2023 are also affecting patient's functional outcome.   REHAB POTENTIAL: Good  CLINICAL DECISION MAKING: Stable/uncomplicated  EVALUATION COMPLEXITY: Moderate  PLAN:  PT FREQUENCY: 2x/week  PT  DURATION: 6 weeks  PLANNED INTERVENTIONS: Therapeutic exercises, Therapeutic activity, Neuromuscular re-education, Balance training, Gait training, Patient/Family education, Self Care, Joint mobilization, Stair training, Vestibular training, Canalith repositioning, Visual/preceptual remediation/compensation, Orthotic/Fit training, DME instructions, Aquatic Therapy, Dry Needling, Electrical stimulation, Cryotherapy, Moist heat, Taping, Manual therapy, and Re-evaluation  PLAN FOR NEXT SESSION:  L quad control, LLE NMR and strengthening, work towards floor transfers if safe and able,Try curb step with RW and or/SPC, compare gait with RW vs with SPC, add to HEP for strengthening/ROM/balance, add in step taps or other hip flexor strengthening to HEP, try hurricane outdoors on uneven ground depending on weather or indoors across uneven mat--did pt purchase hurricane?  Excell Seltzer, PT, DPT, CSRS 10/16/2022, 2:49 PM

## 2022-10-23 ENCOUNTER — Ambulatory Visit: Payer: 59 | Admitting: Physical Therapy

## 2022-10-23 DIAGNOSIS — R2681 Unsteadiness on feet: Secondary | ICD-10-CM

## 2022-10-23 DIAGNOSIS — R2689 Other abnormalities of gait and mobility: Secondary | ICD-10-CM

## 2022-10-23 DIAGNOSIS — M6281 Muscle weakness (generalized): Secondary | ICD-10-CM

## 2022-10-23 NOTE — Therapy (Signed)
OUTPATIENT PHYSICAL THERAPY NEURO TREATMENT   Patient Name: Christopher Morales MRN: [id number] DOB:1971-10-20, 51 y.o., male Today's Date: 10/23/2022   PCP: Leotis Pain, MD REFERRING PROVIDER: Leotis Pain, MD  END OF SESSION:  PT End of Session - 10/23/22 1403     Visit Number 10    Number of Visits 13   with eval   Date for PT Re-Evaluation 11/10/22   to allow for scheduling conflicts   Authorization Type [employer]    Progress Note Due on Visit 10    PT Start Time 1401    PT Stop Time 1444    PT Time Calculation (min) 43 min    Equipment Utilized During Treatment Gait belt    Activity Tolerance Patient tolerated treatment well    Behavior During Therapy WFL for tasks assessed/performed                   Past Medical History:  Diagnosis Date   CHF (congestive heart failure) ([place])    Hypertension    Renal disorder    No past surgical history on file. Patient Active Problem List   Diagnosis Date Noted   OSA (obstructive sleep apnea) 09/24/2022   Chronic combined systolic and diastolic congestive heart failure ([place]) 09/24/2022   Nocturnal hypoxia 09/24/2022    ONSET DATE: 09/10/2022  REFERRING DIAG: M25.519 (ICD-10-CM) - Pain in unspecified shoulder G89.29 (ICD-10-CM) - Chronic pain R26.89 (ICD-10-CM) - Other abnormalities of gait and mobility R29.898 (ICD-10-CM) - Other symptoms and signs involving the musculoskeletal system Z86.73 (ICD-10-CM) - History of stroke  THERAPY DIAG:  Muscle weakness (generalized)  Unsteadiness on feet  Other abnormalities of gait and mobility  Rationale for Evaluation and Treatment: Rehabilitation  SUBJECTIVE:                                                                                                                                                                                             SUBJECTIVE STATEMENT: Pt reports his shoulder was really hurting a few days but he is a lot better today. Did purchase a Hurricane but  is waiting for it to arrive. No falls.   Pt accompanied by: self  PERTINENT HISTORY: chronic systolic heart failure, hx of MI, heart failure, cardiac cath April 2023 and stent, HTN, renal disorder, CVA March 2023  PAIN:  Are you having pain? No  PRECAUTIONS: Fall  WEIGHT BEARING RESTRICTIONS: No  OBJECTIVE:    TODAY'S TREATMENT:          THER EX: SciFit multi-peaks level 4 for 8 minutes using BUE/BLEs for neural priming for reciprocal movement, dynamic  cardiovascular warmup and increased amplitude of stepping. RPE of 2-3/10 following activity.   STAIRS: Level of Assistance: SBA Stair Negotiation Technique: Step to Pattern with Bilateral Rails Number of Stairs: 12  Height of Stairs: 6"  Comments: Pt required min cues to adhere to step-to pattern while ascending, as he attempted to perform alternating pattern and had significant difficulty elevating LLE. W/cues to adhere to step-to pattern, pt much more stable    Performed modified Thomas Stretch, x3 minutes per side, and added to HEP (see bolded below), for improved hip flexor/adductor stretch. Noted significant tightness on LLE > RLE. Pt required min cues for proper set up of stretch.     PATIENT EDUCATION: Education details: Updates to HEP, bringing cane to next session  Person educated: Patient Education method: Explanation, Demonstration, and Handouts Education comprehension: verbalized understanding, returned demonstration, and needs further education  HOME EXERCISE PROGRAM: Access Code: [facility] URL: [email or web address] Date: 09/22/2022 Prepared by: Elease Etienne  Exercises - Sit to Stand  - 1 x daily - 7 x weekly - 3 sets - 5 reps - Mini Squat with Counter Support  - 1 x daily - 7 x weekly - 3 sets - 10 reps - Heel Raises with Counter Support  - 1 x daily - 7 x weekly - 3 sets - 10 reps - Supine Bridge  - 1 x daily - 7 x weekly - 3 sets - 10 reps - Supine Single Bent Knee Fallout  - 1 x daily  - 7 x weekly - 3 sets - 10 reps - Supine Single Knee to Chest Stretch  - 1 x daily - 7 x weekly - 2 sets - 5 reps - 30-60 sec hold - Supine Piriformis Stretch with Leg Straight  - 1 x daily - 7 x weekly - 2 sets - 5 reps - 30-60 sec hold - Seated Hamstring Stretch with Strap  - 1 x daily - 7 x weekly - 2 sets - 5 reps - 30-60 sec hold - Modified Thomas Stretch  - 1 x daily - 7 x weekly - 3 sets - 30-45 second hold   GOALS: Goals reviewed with patient? Yes  SHORT TERM GOALS: Target date: 10/06/2022  Pt will be independent with initial HEP for improved strength, balance, transfers and gait. Baseline:  Established, recently updated, and pt reports compliance. Goal status: MET  2.  Pt will improve gait velocity to at least 2.0 ft/sec for improved gait efficiency and performance at mod I level  Baseline: 1.79 ft/sec (11/20); 2.62 ft/sec modI w/ RW (12/11) Goal status: MET  3.  Pt will ambulate >/=550 feet on 6MWT to demonstrate improved functional endurance for home and community participation. Baseline: 465' w/ RW (11/27); 508' w/ RW (12/11) Goal status: IN PROGRESS  4.  Pt will increase BERG balance score to 44/56 to demonstrate improved static balance. Baseline: 38/56 (11/27); 40/56 (12/11) Goal status: IN PROGRESS   LONG TERM GOALS: Target date: 10/27/2022  Pt will be independent with final HEP for improved strength, balance, transfers and gait. Baseline:  Goal status: INITIAL  2.  Pt will improve gait velocity to at least 3.0 ft/sec for improved gait efficiency and performance at mod I level  Baseline: 1.79 ft/sec (11/20); 2.62 ft/sec modI w/ RW (12/11) Goal status: REVISED  3.  Pt will improve normal TUG to less than or equal to 15 seconds for improved functional mobility and decreased fall risk. Baseline: 21.19 sec with RW (11/20) Goal status: INITIAL  4.  Pt will ambulate >650 feet on 6MWT to demonstrate improved functional endurance for home and community  participation. Baseline: 465' w/ RW (11/27) Goal status: INITIAL  5.  Pt will increase BERG balance score to 48/56 to demonstrate improved static balance. Baseline: 38/56 (11/27) Goal status: INITIAL  6.  Pt will ambulate up and down 12 stairs w/ reciprocal gait at mod I level using rail and cane as needed to improve safe access to home and community environments. Baseline: 4 stairs step to w/ BUE on left rail Goal status: INITIAL  ASSESSMENT:  CLINICAL IMPRESSION: Emphasis of skilled PT session on stair navigation, endurance and improved ROM of bilateral hips. Pt has ordered [facility] but has not received yet, informed pt to bring it to next session. Pt able to ascend/descend steps well if using step-to pattern but pt frequently attempts to perform alternating pattern, resulting in poor balance and heavy over-reliance on BUEs. Added to pt's HEP for improved hip mobility, which pt demonstrated and tolerated well in clinic. Continue POC.    OBJECTIVE IMPAIRMENTS: Abnormal gait, cardiopulmonary status limiting activity, decreased activity tolerance, decreased balance, decreased endurance, decreased strength, increased edema, impaired perceived functional ability, and impaired sensation.   ACTIVITY LIMITATIONS: carrying, lifting, bending, squatting, stairs, and reach over head  PARTICIPATION LIMITATIONS: driving, community activity, occupation, and church  PERSONAL FACTORS: chronic systolic heart failure, hx of MI, heart failure, cardiac cath April 2023 and stent, HTN, renal disorder, CVA March 2023 are also affecting patient's functional outcome.   REHAB POTENTIAL: Good  CLINICAL DECISION MAKING: Stable/uncomplicated  EVALUATION COMPLEXITY: Moderate  PLAN:  PT FREQUENCY: 2x/week  PT DURATION: 6 weeks  PLANNED INTERVENTIONS: Therapeutic exercises, Therapeutic activity, Neuromuscular re-education, Balance training, Gait training, Patient/Family education, Self Care, Joint  mobilization, Stair training, Vestibular training, Canalith repositioning, Visual/preceptual remediation/compensation, Orthotic/Fit training, DME instructions, Aquatic Therapy, Dry Needling, Electrical stimulation, Cryotherapy, Moist heat, Taping, Manual therapy, and Re-evaluation  PLAN FOR NEXT SESSION:  L quad control, LLE NMR and strengthening, work towards floor transfers if safe and able,Try curb step with RW and or/SPC, compare gait with RW vs with SPC, add to HEP for strengthening/ROM/balance, add in step taps or other hip flexor strengthening to HEP, try hurricane outdoors on uneven ground depending on weather or indoors across uneven mat--did pt bring hurricane?  Cruzita Lederer Carlitos Bottino, PT, DPT 10/23/2022, 2:47 PM

## 2022-10-30 ENCOUNTER — Ambulatory Visit: Payer: 59 | Attending: Internal Medicine | Admitting: Physical Therapy

## 2022-10-30 DIAGNOSIS — R2681 Unsteadiness on feet: Secondary | ICD-10-CM | POA: Diagnosis present

## 2022-10-30 DIAGNOSIS — R2689 Other abnormalities of gait and mobility: Secondary | ICD-10-CM | POA: Diagnosis present

## 2022-10-30 DIAGNOSIS — G8929 Other chronic pain: Secondary | ICD-10-CM | POA: Diagnosis present

## 2022-10-30 DIAGNOSIS — R208 Other disturbances of skin sensation: Secondary | ICD-10-CM | POA: Insufficient documentation

## 2022-10-30 DIAGNOSIS — M25512 Pain in left shoulder: Secondary | ICD-10-CM | POA: Diagnosis present

## 2022-10-30 DIAGNOSIS — M6281 Muscle weakness (generalized): Secondary | ICD-10-CM | POA: Diagnosis not present

## 2022-10-30 DIAGNOSIS — M25612 Stiffness of left shoulder, not elsewhere classified: Secondary | ICD-10-CM | POA: Insufficient documentation

## 2022-10-30 NOTE — Therapy (Signed)
OUTPATIENT PHYSICAL THERAPY NEURO TREATMENT-10TH VISIT PROGRESS NOTE   Patient Name: Christopher Morales MRN: [id number] DOB:[birth year], 52 y.o., male Today's Date: 10/30/2022   PCP: Leotis Pain, MD REFERRING PROVIDER: Leotis Pain, MD  Physical Therapy Progress Note   Dates of Reporting Period:09/15/2022 - 10/30/2022  See Note below for Objective Data and Assessment of Progress/Goals.  Thank you for the referral of this patient. Excell Seltzer, PT, DPT, CSRS   END OF SESSION:  PT End of Session - 10/30/22 1358     Visit Number 11    Number of Visits 13   with eval   Date for PT Re-Evaluation 11/10/22   to allow for scheduling conflicts   Authorization Type [employer]    Progress Note Due on Visit 10    PT Start Time 5277    PT Stop Time 1445    PT Time Calculation (min) 47 min    Equipment Utilized During Treatment Gait belt    Activity Tolerance Patient tolerated treatment well    Behavior During Therapy WFL for tasks assessed/performed                    Past Medical History:  Diagnosis Date   CHF (congestive heart failure) ([place])    Hypertension    Renal disorder    No past surgical history on file. Patient Active Problem List   Diagnosis Date Noted   OSA (obstructive sleep apnea) 09/24/2022   Chronic combined systolic and diastolic congestive heart failure ([place]) 09/24/2022   Nocturnal hypoxia 09/24/2022    ONSET DATE: 09/10/2022  REFERRING DIAG: M25.519 (ICD-10-CM) - Pain in unspecified shoulder G89.29 (ICD-10-CM) - Chronic pain R26.89 (ICD-10-CM) - Other abnormalities of gait and mobility R29.898 (ICD-10-CM) - Other symptoms and signs involving the musculoskeletal system Z86.73 (ICD-10-CM) - History of stroke  THERAPY DIAG:  Muscle weakness (generalized)  Unsteadiness on feet  Other abnormalities of gait and mobility  Other disturbances of skin sensation  Rationale for Evaluation and Treatment: Rehabilitation  SUBJECTIVE:                                                                                                                                                                                              SUBJECTIVE STATEMENT: No falls or acute changes since last time. No pain today. Pt reports he feels like his L shoulder is getting stronger. Pt brings his hurricane to his appointment today but uses his RW to walk into his appointment. Pt reports he has been doing the [employer] but he really feels the [facility] and other stretches more afterwards than the  Thomas stretch.  Pt accompanied by: self  PERTINENT HISTORY: chronic systolic heart failure, hx of MI, heart failure, cardiac cath April 2023 and stent, HTN, renal disorder, CVA March 2023  PAIN:  Are you having pain? No  PRECAUTIONS: Fall  WEIGHT BEARING RESTRICTIONS: No  OBJECTIVE:    TODAY'S TREATMENT:          THER EX: SciFit multi-peaks level 4 for 8 minutes using BUE/BLEs for neural priming for reciprocal movement, dynamic cardiovascular warmup and increased amplitude of stepping. RPE of 4-5/10 following activity. 639 steps total.  GAIT: Gait pattern: decreased hip/knee flexion- Left, decreased ankle dorsiflexion- Left, and circumduction- Left Distance walked: 115 ft Assistive device utilized: Quad cane small base "hurricane" Level of assistance: Modified independence Comments: trial gait with pt's new hurricane, decreased L hip and knee flexion, circumduction  Gait pattern: decreased hip/knee flexion- Left Distance walked: 115 ft Assistive device utilized: Quad cane small base "hurricane" Level of assistance: Modified independence Comments: focus on increasing L hip flexion and L heel strike during gait with decreased rigidity of limb noted during gait   THER ACT: At mat table standing with hurricane and CGA with focus on increasing LLE clearance and step height during gait: Attempted foam block step-overs, difficulty clearing LLE Transitioned to yard stick  step-overs with LLE Decreased hip and knee flexion Yard stick step-overs with LLE with back of chair for support Standing LLE marches with back of chair for support Standing LLE 4" step-taps  Added step taps to HEP, see bolded below   PATIENT EDUCATION: Education details: continue HEP, updated HEP Person educated: Patient Education method: Explanation, Demonstration, and Handouts Education comprehension: verbalized understanding, returned demonstration, and needs further education  HOME EXERCISE PROGRAM: Access Code: [facility] URL: [email or web address] Date: 09/22/2022 Prepared by: Elease Etienne  Exercises - Sit to Stand  - 1 x daily - 7 x weekly - 3 sets - 5 reps - Mini Squat with Counter Support  - 1 x daily - 7 x weekly - 3 sets - 10 reps - Heel Raises with Counter Support  - 1 x daily - 7 x weekly - 3 sets - 10 reps - Supine Bridge  - 1 x daily - 7 x weekly - 3 sets - 10 reps - Supine Single Bent Knee Fallout  - 1 x daily - 7 x weekly - 3 sets - 10 reps - Supine Single Knee to Chest Stretch  - 1 x daily - 7 x weekly - 2 sets - 5 reps - 30-60 sec hold - Supine Piriformis Stretch with Leg Straight  - 1 x daily - 7 x weekly - 2 sets - 5 reps - 30-60 sec hold - Seated Hamstring Stretch with Strap  - 1 x daily - 7 x weekly - 2 sets - 5 reps - 30-60 sec hold - Modified Thomas Stretch  - 1 x daily - 7 x weekly - 3 sets - 30-45 second hold - Alternating Step Taps with Counter Support  - 1 x daily - 7 x weekly - 3 sets - 10 reps   GOALS: Goals reviewed with patient? Yes  SHORT TERM GOALS: Target date: 10/06/2022  Pt will be independent with initial HEP for improved strength, balance, transfers and gait. Baseline:  Established, recently updated, and pt reports compliance. Goal status: MET  2.  Pt will improve gait velocity to at least 2.0 ft/sec for improved gait efficiency and performance at mod I level  Baseline: 1.79 ft/sec (11/20); 2.62 ft/sec  modI w/ RW  (12/11) Goal status: MET  3.  Pt will ambulate >/=550 feet on 6MWT to demonstrate improved functional endurance for home and community participation. Baseline: 465' w/ RW (11/27); 508' w/ RW (12/11) Goal status: IN PROGRESS  4.  Pt will increase BERG balance score to 44/56 to demonstrate improved static balance. Baseline: 38/56 (11/27); 40/56 (12/11) Goal status: IN PROGRESS   LONG TERM GOALS: Target date: 11/10/2022 (date updated to match certification date)  Pt will be independent with final HEP for improved strength, balance, transfers and gait. Baseline:  Goal status: INITIAL  2.  Pt will improve gait velocity to at least 3.0 ft/sec for improved gait efficiency and performance at mod I level  Baseline: 1.79 ft/sec (11/20); 2.62 ft/sec modI w/ RW (12/11) Goal status: REVISED  3.  Pt will improve normal TUG to less than or equal to 15 seconds for improved functional mobility and decreased fall risk. Baseline: 21.19 sec with RW (11/20) Goal status: INITIAL  4.  Pt will ambulate >650 feet on 6MWT to demonstrate improved functional endurance for home and community participation. Baseline: 465' w/ RW (11/27) Goal status: INITIAL  5.  Pt will increase BERG balance score to 48/56 to demonstrate improved static balance. Baseline: 38/56 (11/27) Goal status: INITIAL  6.  Pt will ambulate up and down 12 stairs w/ reciprocal gait at mod I level using rail and cane as needed to improve safe access to home and community environments. Baseline: 4 stairs step to w/ BUE on left rail Goal status: INITIAL  ASSESSMENT:  CLINICAL IMPRESSION: Emphasis of skilled PT session on assessing gait with pt's hurricane in the clinic this date and working on LLE strengthening and control to decrease gait deviations. Pt exhibits decreased L hip and knee flexion as well as decreased ankle DF during gait, somewhat improved following blocked practice of hip flexion as well as cues to heel strike during gait.  Pt does continue to benefit from skilled therapy services to address ongoing L hemibody weakness and decreased strength and ROM in LLE. Continue POC.    OBJECTIVE IMPAIRMENTS: Abnormal gait, cardiopulmonary status limiting activity, decreased activity tolerance, decreased balance, decreased endurance, decreased strength, increased edema, impaired perceived functional ability, and impaired sensation.   ACTIVITY LIMITATIONS: carrying, lifting, bending, squatting, stairs, and reach over head  PARTICIPATION LIMITATIONS: driving, community activity, occupation, and church  PERSONAL FACTORS: chronic systolic heart failure, hx of MI, heart failure, cardiac cath April 2023 and stent, HTN, renal disorder, CVA March 2023 are also affecting patient's functional outcome.   REHAB POTENTIAL: Good  CLINICAL DECISION MAKING: Stable/uncomplicated  EVALUATION COMPLEXITY: Moderate  PLAN:  PT FREQUENCY: 2x/week  PT DURATION: 6 weeks  PLANNED INTERVENTIONS: Therapeutic exercises, Therapeutic activity, Neuromuscular re-education, Balance training, Gait training, Patient/Family education, Self Care, Joint mobilization, Stair training, Vestibular training, Canalith repositioning, Visual/preceptual remediation/compensation, Orthotic/Fit training, DME instructions, Aquatic Therapy, Dry Needling, Electrical stimulation, Cryotherapy, Moist heat, Taping, Manual therapy, and Re-evaluation  PLAN FOR NEXT SESSION:  L quad control, LLE NMR and strengthening, work towards floor transfers if safe and able,Try curb step with RW and or/SPC, add to HEP for strengthening/ROM/balance, hip flexor strengthening to HEP, try hurricane outdoors on uneven ground depending on weather or indoors across uneven mat  Excell Seltzer, PT, DPT, CSRS 10/30/2022, 2:47 PM

## 2022-11-03 ENCOUNTER — Ambulatory Visit: Payer: 59 | Admitting: Physical Therapy

## 2022-11-03 ENCOUNTER — Ambulatory Visit: Payer: 59 | Admitting: Occupational Therapy

## 2022-11-03 DIAGNOSIS — M25612 Stiffness of left shoulder, not elsewhere classified: Secondary | ICD-10-CM

## 2022-11-03 DIAGNOSIS — M6281 Muscle weakness (generalized): Secondary | ICD-10-CM | POA: Diagnosis not present

## 2022-11-03 DIAGNOSIS — G8929 Other chronic pain: Secondary | ICD-10-CM

## 2022-11-03 NOTE — Patient Instructions (Signed)
Flexion (Assistive)    Laying down, knees bent up, clasp hands together or hold Lt wrist (thumb side up) and raise arms above head, keeping elbows as straight as possible. Can be done sitting or lying. Repeat __10__ times SLOWLY. Do __2__ sessions per day.   Cranial Flexion: Overhead Arm Extension - Supine (Medicine Diona Foley)    Lie with knees bent, arms beyond head, holding PAPER TOWEL ROLL or shoebox, palms facing. Slowly lift up and over head. Repeat __10__ times per set. Do _2___ sets per day.   Triceps, Butterfly    Laying down, fingertips on shoulders or clasped behind head. Raise elbows above shoulder level and bring together as close as possible. Hold _5__ seconds. Repeat _10__ times per session. Do _2__ sessions per day.   Coordination Activities  Perform the following activities for 15 minutes 1 times per day with left hand(s).  Place deck of cards on table. Open all fingers big before flipping, then flip cards over 1 at a time. Flip one card or highlighter between first 3 fingers. Pick up coins and stack. Do 3 stacks of 5 Pick up checkers 1 at a time and place in your palm until you get 3; then move from palm to fingertips 1 at a time (while holding the other 2) to stack Practice folding towels and washcloths and typing with both hands Use Lt hand to pick up remote, open drawers and lower cabinets, pick up light weight objects, etc  Do NOT use Lt hand for anything: hot, heavy, breakable, or sharp

## 2022-11-03 NOTE — Therapy (Signed)
OUTPATIENT OCCUPATIONAL THERAPY NEURO TREATMENT  Patient Name: Christopher Morales MRN: [id number] DOB:[birth year], 52 y.o., male Today's Date: 11/03/2022  PCP:  Wilfred Curtis, MD REFERRING PROVIDER:  Wilfred Curtis, MD  END OF SESSION:  OT End of Session - 11/03/22 1320     Visit Number 2    Number of Visits 17    Authorization Type [employer] 30 VL    OT Start Time 1315    OT Stop Time 1405    OT Time Calculation (min) 50 min    Activity Tolerance Patient tolerated treatment well    Behavior During Therapy WFL for tasks assessed/performed             Past Medical History:  Diagnosis Date   CHF (congestive heart failure) ([facility])    Hypertension    Renal disorder    No past surgical history on file. Patient Active Problem List   Diagnosis Date Noted   OSA (obstructive sleep apnea) 09/24/2022   Chronic combined systolic and diastolic congestive heart failure ([facility]) 09/24/2022   Nocturnal hypoxia 09/24/2022    ONSET DATE: 09/10/2022  REFERRING DIAG: M25.519 (ICD-10-CM) - Pain in unspecified shoulder G89.29 (ICD-10-CM) - Chronic pain R26.89 (ICD-10-CM) - Other abnormalities of gait and mobility R29.898 (ICD-10-CM) - Other symptoms and signs involving the musculoskeletal system Z86.73 (ICD-10-CM) - History of stroke    THERAPY DIAG:  Stiffness of left shoulder, not elsewhere classified  Chronic left shoulder pain  Muscle weakness (generalized)  Rationale for Evaluation and Treatment: Rehabilitation  SUBJECTIVE:   SUBJECTIVE STATEMENT: My stroke was 10 months ago and I was laid up for awhile   Pt accompanied by: self  PERTINENT HISTORY: Patient is a 52 year old male referred to Neuro OPPT for CVA.   Pt's PMH is significant for: chronic systolic heart failure, hx of MI, heart failure, cardiac cath April 2023 and stent, HTN, renal disorder, CVA March 2023. The following deficits were present during the exam: decreased gait speed, decreased LE strength, and increased fall risk.  Based on his history of falls, gait speed of 1.79 ft/sec, and TUG score of 21.19 sec, pt is an increased risk for falls. Pt would benefit from skilled PT to address these impairments and functional limitations to maximize functional mobility independence  PRECAUTIONS: Fall  WEIGHT BEARING RESTRICTIONS: No  PAIN:  Are you having pain? No, but I do have some pain in my Lt shoulder at end of day. My numbness remains  FALLS: Has patient fallen in last 6 months? Yes. Number of falls 1  LIVING ENVIRONMENT: Lives with: lives with their family Lives in: House/apartment Stairs: Yes: External: 3 steps; on left going up Has following equipment at home: Single point cane, Walker - 2 wheeled, Wheelchair (manual), Shower bench, and bed side commode  PLOF: Independent with basic ADLs and Independent with household mobility with device  PATIENT GOALS: return to work, regain arm function  OBJECTIVE:   HAND DOMINANCE: Right  ADLs: Overall ADLs: Modified Independent Transfers/ambulation related to ADLs: Eating: mod I Grooming: mod I UB Dressing: mod I LB Dressing: mod I Toileting: mod I Bathing: mod I Tub Shower transfers: has tub transfer bench, and toilet surround Equipment: Transfer tub bench and bed side commode  IADLs:  Light housekeeping: Is doing laundry  Meal Prep: Is not cooking Community mobility: Is not driving   POSTURE COMMENTS:  rounded shoulders, forward head, and posterior pelvic tilt   ACTIVITY TOLERANCE: Activity tolerance: Mild impairment   UPPER  EXTREMITY ROM:    Active ROM Right eval Left eval  Shoulder flexion WFL AROM 80/ PROM110  Shoulder abduction  70  Shoulder adduction    Shoulder extension    Shoulder internal rotation    Shoulder external rotation    Elbow flexion  -25  Elbow extension  -10  Wrist flexion    Wrist extension  30  Wrist ulnar deviation    Wrist radial deviation    Wrist pronation    Wrist supination    (Blank rows = not  tested)  UPPER EXTREMITY MMT:     MMT Right eval Left eval  Shoulder flexion 4+/5 NT  Shoulder abduction Thruout   Shoulder adduction    Shoulder extension    Shoulder internal rotation    Shoulder external rotation    Middle trapezius    Lower trapezius    Elbow flexion    Elbow extension    Wrist flexion    Wrist extension    Wrist ulnar deviation    Wrist radial deviation    Wrist pronation    Wrist supination    (Blank rows = not tested)  HAND FUNCTION: Grip strength: Right: 76 lbs; Left: 26 lbs   COORDINATION: 9 Hole Peg test: Right: 35 sec; Left: 85min52  sec  SENSATION: Light touch: Impaired  Proprioception: Impaired     MUSCLE TONE: LUE: Moderate and Hypertonic  COGNITION: Overall cognitive status: Impaired  VISION: Subjective report: Reports having nystagmus initially Baseline vision: No visual deficits Visual history:  NA  VISION ASSESSMENT: WFL    PERCEPTION: Impaired: Inattention/neglect: does not attend to left side of body  PRAXIS: WFL    TODAY'S TREATMENT:                                                                                                                               Pt issued HEP for shoulder and coordination HEP for LUE - See pt instructions for details.  Pt instructed to avoid certain ex's like pulleys, bicep curls, and "chicken" exercise. Also discussed insurance as pt had questions re: visit limit  PATIENT EDUCATION: Education details: HEP Person educated: Patient Education method: Consulting civil engineer, demo, and handouts Education comprehension: verbalized understanding, returned demonstration, and verbal cues required  HOME EXERCISE PROGRAM: Patient will bring HEP from [facility] therapy next visit   GOALS: Goals reviewed with patient? No  SHORT TERM GOALS: Target date: 11/09/22  Patient will complete an HEP designed to improve range of motion in left shoulder  Goal status: MET  2.  Patient will demonstrate effectively  low reach to obtain and replace a lightweight (less than 2lb) object from counter height in standing  Goal status: INITIAL  3.  Patient will demonstrate at least 100 degrees of active shoulder flexion in supine as preparation for mid level reach  Goal status: INITIAL  4.  Patient will report pain no greater than 2/10 with passive range of motion to 120  degrees of shoulder flexion  Goal status: INITIAL    LONG TERM GOALS: Target date: 12/10/22  Patient will complete HEP designed to improve Left grip strength  Goal status: INITIAL  2.  Patient will demonstrate at least 90* of active shoulder flexion in upright position without lateral trunk flexion or excessive scapular elevation in preparation for mid level reach  Goal status: INITIAL  3.  Patient will demonstrate at least a 5 lb increase in left grip strength Baseline: 26lb Goal status: INITIAL  4.  Patient will demonstrate improved left coordination as evidenced by decreased time on 9 hole peg test to 1 min 30 sec or less.  Baseline: 52 sec Goal status: INITIAL  5.  Patient will shower with modified independence  Goal status: INITIAL  6.  Patient will prepare simple meal with modified independence  Goal status: INITIAL  ASSESSMENT:  CLINICAL IMPRESSION: Patient returns today after initial evaluation for initiation of HEP. Pt tolerating HEP well today  PERFORMANCE DEFICITS: in functional skills including ADLs, IADLs, coordination, dexterity, proprioception, sensation, tone, ROM, strength, pain, fascial restrictions, flexibility, Fine motor control, Gross motor control, mobility, balance, body mechanics, and UE functional use, cognitive skills including attention IMPAIRMENTS: are limiting patient from ADLs, IADLs, and work.   CO-MORBIDITIES: may have co-morbidities  that affects occupational performance. Patient will benefit from skilled OT to address above impairments and improve overall function.  MODIFICATION  OR ASSISTANCE TO COMPLETE EVALUATION: Min-Moderate modification of tasks or assist with assess necessary to complete an evaluation.  OT OCCUPATIONAL PROFILE AND HISTORY: Detailed assessment: Review of records and additional review of physical, cognitive, psychosocial history related to current functional performance.  CLINICAL DECISION MAKING: Moderate - several treatment options, min-mod task modification necessary  REHAB POTENTIAL: Good  EVALUATION COMPLEXITY: Moderate    PLAN:  OT FREQUENCY: 2x/week  OT DURATION: 8 weeks  PLANNED INTERVENTIONS: self care/ADL training, therapeutic exercise, therapeutic activity, neuromuscular re-education, manual therapy, passive range of motion, balance training, functional mobility training, aquatic therapy, cognitive remediation/compensation, visual/perceptual remediation/compensation, and DME and/or AE instructions  RECOMMENDED OTHER SERVICES: NA  CONSULTED AND AGREED WITH PLAN OF CARE: Patient  PLAN FOR NEXT SESSION: Review HEP prn, neuro-reeducation (body on arm and closed chain AA/ROM LUE), low to mid range functional use LUE   Sheran Lawless, OT 11/03/2022, 2:16 PM

## 2022-11-04 ENCOUNTER — Telehealth: Payer: Self-pay | Admitting: Cardiovascular Disease

## 2022-11-04 NOTE — Telephone Encounter (Signed)
Patient stated he has been having problems with his sleep equipment and would like advice on next steps.

## 2022-11-05 ENCOUNTER — Encounter: Payer: 59 | Admitting: Occupational Therapy

## 2022-11-06 ENCOUNTER — Ambulatory Visit: Payer: 59 | Admitting: Physical Therapy

## 2022-11-06 DIAGNOSIS — R2681 Unsteadiness on feet: Secondary | ICD-10-CM

## 2022-11-06 DIAGNOSIS — M6281 Muscle weakness (generalized): Secondary | ICD-10-CM

## 2022-11-06 DIAGNOSIS — R2689 Other abnormalities of gait and mobility: Secondary | ICD-10-CM

## 2022-11-06 NOTE — Therapy (Signed)
OUTPATIENT PHYSICAL THERAPY NEURO TREATMENT   Patient Name: Christopher Morales MRN: [id number] DOB:[birth year], 52 y.o., male Today's Date: 11/06/2022   PCP: Wilfred Curtis, MD REFERRING PROVIDER: Wilfred Curtis, MD     END OF SESSION:  PT End of Session - 11/06/22 1359     Visit Number 12    Number of Visits 13   with eval   Date for PT Re-Evaluation 11/10/22   to allow for scheduling conflicts   Authorization Type [employer]    Authorization Time Period 30 visit limit PT/OT    Authorization - Visit Number 2    Authorization - Number of Visits 15   30 visit limit PT/OT   Progress Note Due on Visit 10    PT Start Time 1358    PT Stop Time 1451    PT Time Calculation (min) 53 min    Equipment Utilized During Treatment Gait belt    Activity Tolerance Patient tolerated treatment well    Behavior During Therapy WFL for tasks assessed/performed                     Past Medical History:  Diagnosis Date   CHF (congestive heart failure) (HCC)    Hypertension    Renal disorder    No past surgical history on file. Patient Active Problem List   Diagnosis Date Noted   OSA (obstructive sleep apnea) 09/24/2022   Chronic combined systolic and diastolic congestive heart failure (HCC) 09/24/2022   Nocturnal hypoxia 09/24/2022    ONSET DATE: 09/10/2022  REFERRING DIAG: M25.519 (ICD-10-CM) - Pain in unspecified shoulder G89.29 (ICD-10-CM) - Chronic pain R26.89 (ICD-10-CM) - Other abnormalities of gait and mobility R29.898 (ICD-10-CM) - Other symptoms and signs involving the musculoskeletal system Z86.73 (ICD-10-CM) - History of stroke  THERAPY DIAG:  Muscle weakness (generalized)  Unsteadiness on feet  Other abnormalities of gait and mobility  Rationale for Evaluation and Treatment: Rehabilitation  SUBJECTIVE:                                                                                                                                                                                              SUBJECTIVE STATEMENT: Pt reports that he had to turn down several funeral engagements due to not being able to walk across uneven surfaces, asking about a walker with "big wheels". Pt reports he continues to work on his stretches at home, does feel sore the next day after working on them.  Pt accompanied by: self  PERTINENT HISTORY: chronic systolic heart failure, hx of MI, heart failure, cardiac cath April 2023 and stent, HTN,  renal disorder, CVA March 2023  PAIN:  Are you having pain? No  PRECAUTIONS: Fall  WEIGHT BEARING RESTRICTIONS: No  OBJECTIVE:    TODAY'S TREATMENT:           GAIT:  Gait pattern: decreased hip/knee flexion- Left and genu recurvatum- Left Distance walked: 115 ft Assistive device utilized: Walker - 4 wheeled Level of assistance: CGA Comments: trial gait with rollator, significant path deviation that decreases with decrease in speed; unsafe to use device independently at this time   Gait pattern: decreased hip/knee flexion- Left and genu recurvatum- Left Distance walked: 115 ft Assistive device utilized: Walker - 2 wheeled Level of assistance: Modified independence Comments: trial gait with heel wedge in L shoe to try and control genu recurvatum; pt exhibits no improvement with 1/4" wedge and slight decrease in L knee hyperextension with use of thicker (3/8") heel wedge; pt given 3/8" wedge for use over the next week to see if it decreases his hyperextension during gait   THER ACT: Reassessed 6MWT: 483 ft, 0/10 fatigue   PATIENT EDUCATION: Education details: continue HEP, OM results, PT POC, TPDN (handout provided) Person educated: Patient Education method: Explanation, Demonstration, and Handouts Education comprehension: verbalized understanding, returned demonstration, and needs further education  HOME EXERCISE PROGRAM: Access Code: [facility] URL: [email or web address] Date: 09/22/2022 Prepared by: Elease Etienne  Exercises - Sit to Stand  - 1 x daily - 7 x weekly - 3 sets - 5 reps - Mini Squat with Counter Support  - 1 x daily - 7 x weekly - 3 sets - 10 reps - Heel Raises with Counter Support  - 1 x daily - 7 x weekly - 3 sets - 10 reps - Supine Bridge  - 1 x daily - 7 x weekly - 3 sets - 10 reps - Supine Single Bent Knee Fallout  - 1 x daily - 7 x weekly - 3 sets - 10 reps - Supine Single Knee to Chest Stretch  - 1 x daily - 7 x weekly - 2 sets - 5 reps - 30-60 sec hold - Supine Piriformis Stretch with Leg Straight  - 1 x daily - 7 x weekly - 2 sets - 5 reps - 30-60 sec hold - Seated Hamstring Stretch with Strap  - 1 x daily - 7 x weekly - 2 sets - 5 reps - 30-60 sec hold - Modified Thomas Stretch  - 1 x daily - 7 x weekly - 3 sets - 30-45 second hold - Alternating Step Taps with Counter Support  - 1 x daily - 7 x weekly - 3 sets - 10 reps   GOALS: Goals reviewed with patient? Yes  SHORT TERM GOALS: Target date: 10/06/2022  Pt will be independent with initial HEP for improved strength, balance, transfers and gait. Baseline:  Established, recently updated, and pt reports compliance. Goal status: MET  2.  Pt will improve gait velocity to at least 2.0 ft/sec for improved gait efficiency and performance at mod I level  Baseline: 1.79 ft/sec (11/20); 2.62 ft/sec modI w/ RW (12/11) Goal status: MET  3.  Pt will ambulate >/=550 feet on 6MWT to demonstrate improved functional endurance for home and community participation. Baseline: 465' w/ RW (11/27); 508' w/ RW (12/11) Goal status: IN PROGRESS  4.  Pt will increase BERG balance score to 44/56 to demonstrate improved static balance. Baseline: 38/56 (11/27); 40/56 (12/11) Goal status: IN PROGRESS   LONG TERM GOALS: Target date: 11/10/2022 (date updated to match  certification date)  Pt will be independent with final HEP for improved strength, balance, transfers and gait. Baseline:  Goal status: INITIAL  2.  Pt will improve gait  velocity to at least 3.0 ft/sec for improved gait efficiency and performance at mod I level  Baseline: 1.79 ft/sec (11/20); 2.62 ft/sec modI w/ RW (12/11) Goal status: REVISED  3.  Pt will improve normal TUG to less than or equal to 15 seconds for improved functional mobility and decreased fall risk. Baseline: 21.19 sec with RW (11/20) Goal status: INITIAL  4.  Pt will ambulate >650 feet on 6MWT to demonstrate improved functional endurance for home and community participation. Baseline: 465' w/ RW (11/27), 508' w/ RW (12/11), 54' with RW (1/11) Goal status: IN PROGRESS  5.  Pt will increase BERG balance score to 48/56 to demonstrate improved static balance. Baseline: 38/56 (11/27), 40/56 (12/11) Goal status: INITIAL  6.  Pt will ambulate up and down 12 stairs w/ reciprocal gait at mod I level using rail and cane as needed to improve safe access to home and community environments. Baseline: 4 stairs step to w/ BUE on left rail Goal status: INITIAL  ASSESSMENT:  CLINICAL IMPRESSION: Emphasis of skilled PT session on initiating assessment of STG, trialing use of a rollator, and working on address ongoing LLE gait deviations. Pt was able to ambulate x 483 ft on 6MWT, an increase from initial assessment of 465 ft but a decrease from last assessment of 508 ft, will reassess goal next session. Additionally, due to pt reports of difficulty ambulating over uneven ground trialed use of a rollator this date. However, due to ongoing L hemibody weakness and balance deficits pt is not safe to ambulate independently with a rollator at this time due to significant path deviations. Pt continues to exhibit "stiffness" in his LLE>RLE during gait with decreased hip and knee flexion and ongoing genu recurvatum of L knee. Added 3/8" heel wedge to L shoe with minor improvement in recurvatum. Will continue to assess STG next session, continue to work on improving gait impairments, and will add to HEP for LE  strengthening and ROM. Continue POC.    OBJECTIVE IMPAIRMENTS: Abnormal gait, cardiopulmonary status limiting activity, decreased activity tolerance, decreased balance, decreased endurance, decreased strength, increased edema, impaired perceived functional ability, and impaired sensation.   ACTIVITY LIMITATIONS: carrying, lifting, bending, squatting, stairs, and reach over head  PARTICIPATION LIMITATIONS: driving, community activity, occupation, and church  PERSONAL FACTORS: chronic systolic heart failure, hx of MI, heart failure, cardiac cath April 2023 and stent, HTN, renal disorder, CVA March 2023 are also affecting patient's functional outcome.   REHAB POTENTIAL: Good  CLINICAL DECISION MAKING: Stable/uncomplicated  EVALUATION COMPLEXITY: Moderate  PLAN:  PT FREQUENCY: 2x/week  PT DURATION: 6 weeks  PLANNED INTERVENTIONS: Therapeutic exercises, Therapeutic activity, Neuromuscular re-education, Balance training, Gait training, Patient/Family education, Self Care, Joint mobilization, Stair training, Vestibular training, Canalith repositioning, Visual/preceptual remediation/compensation, Orthotic/Fit training, DME instructions, Aquatic Therapy, Dry Needling, Electrical stimulation, Cryotherapy, Moist heat, Taping, Manual therapy, and Re-evaluation  PLAN FOR NEXT SESSION:  assess STG and recert, TKE (quad strength), work towards floor transfers if safe and able,Try curb step with RW and or/SPC, add to HEP for strengthening/ROM/balance, hip flexor strengthening to HEP, try hurricane outdoors on uneven ground depending on weather or indoors across uneven mat, dry needling of L hip and/or shoulder?, walking program  Excell Seltzer, PT, DPT, CSRS 11/06/2022, 3:48 PM

## 2022-11-06 NOTE — Telephone Encounter (Signed)
Left message call is bein returned to him. Call me back if assistance still needed.

## 2022-11-10 ENCOUNTER — Encounter: Payer: 59 | Admitting: Occupational Therapy

## 2022-11-10 ENCOUNTER — Ambulatory Visit: Payer: 59 | Admitting: Physical Therapy

## 2022-11-10 DIAGNOSIS — R2681 Unsteadiness on feet: Secondary | ICD-10-CM

## 2022-11-10 DIAGNOSIS — M6281 Muscle weakness (generalized): Secondary | ICD-10-CM

## 2022-11-10 DIAGNOSIS — R2689 Other abnormalities of gait and mobility: Secondary | ICD-10-CM

## 2022-11-10 NOTE — Therapy (Signed)
OUTPATIENT PHYSICAL THERAPY NEURO TREATMENT-RECERTIFICATION   Patient Name: Christopher Morales MRN: [id number] DOB:1971-08-12, 52 y.o., male Today's Date: 11/10/2022   PCP: Leotis Pain, MD REFERRING PROVIDER: Leotis Pain, MD     END OF SESSION:  PT End of Session - 11/10/22 1403     Visit Number 13    Number of Visits 28   recert   Date for PT Re-Evaluation 40/34/74   recert   Authorization Type [employer]    Authorization Time Period 35 visit limit PT/OT    Authorization - Visit Number 3    Authorization - Number of Visits 15   30 visit limit PT/OT   Progress Note Due on Visit 10    PT Start Time 1402    PT Stop Time 1456    PT Time Calculation (min) 54 min    Equipment Utilized During Treatment Gait belt    Activity Tolerance Patient tolerated treatment well    Behavior During Therapy WFL for tasks assessed/performed                      Past Medical History:  Diagnosis Date   CHF (congestive heart failure) ([place])    Hypertension    Renal disorder    No past surgical history on file. Patient Active Problem List   Diagnosis Date Noted   OSA (obstructive sleep apnea) 09/24/2022   Chronic combined systolic and diastolic congestive heart failure ([place]) 09/24/2022   Nocturnal hypoxia 09/24/2022    ONSET DATE: 09/10/2022  REFERRING DIAG: M25.519 (ICD-10-CM) - Pain in unspecified shoulder G89.29 (ICD-10-CM) - Chronic pain R26.89 (ICD-10-CM) - Other abnormalities of gait and mobility R29.898 (ICD-10-CM) - Other symptoms and signs involving the musculoskeletal system Z86.73 (ICD-10-CM) - History of stroke  THERAPY DIAG:  Muscle weakness (generalized)  Unsteadiness on feet  Other abnormalities of gait and mobility  Rationale for Evaluation and Treatment: Rehabilitation  SUBJECTIVE:                                                                                                                                                                                              SUBJECTIVE STATEMENT: Pt reports he had a great weekend and was able to socialize with friends. Pt also reports that his R shoulder is hurting today, he may have pulled a muscle doing exercises or overuse strain from standing supported with his RUE on his QC x 30 minutes. Pt sees his PCP (Dr. Ermalinda Barrios) on Friday, going to ask about Baclofen due to ongoing muscle tightness.  Pt accompanied by: self  PERTINENT HISTORY: chronic systolic heart failure,  hx of MI, heart failure, cardiac cath April 2023 and stent, HTN, renal disorder, CVA March 2023  PAIN:  Are you having pain? No  PRECAUTIONS: Fall  WEIGHT BEARING RESTRICTIONS: No  OBJECTIVE:    TODAY'S TREATMENT:           THER ACT: Reassessed for LTG assessment:  [facility] PT Assessment - 11/10/22 1415       Ambulation/Gait   Gait velocity 32.8 ft over 13.19 sec = 2.49 ft/sec      6 minute walk test results    Aerobic Endurance Distance Walked 585   ft with RW     Standardized Balance Assessment   Standardized Balance Assessment Berg Balance Test;Timed Up and Go Test      Berg Balance Test   Sit to Stand Able to stand without using hands and stabilize independently    Standing Unsupported Able to stand safely 2 minutes    Sitting with Back Unsupported but Feet Supported on Floor or Stool Able to sit safely and securely 2 minutes    Stand to Sit Sits safely with minimal use of hands    Transfers Able to transfer safely, minor use of hands    Standing Unsupported with Eyes Closed Able to stand 10 seconds safely    Standing Unsupported with Feet Together Able to place feet together independently and stand 1 minute safely    From Standing, Reach Forward with Outstretched Arm Can reach forward >12 cm safely (5")    From Standing Position, Pick up Object from Floor Able to pick up shoe, needs supervision    From Standing Position, Turn to Look Behind Over each Shoulder Looks behind one side only/other side shows less weight shift     Turn 360 Degrees Able to turn 360 degrees safely but slowly    Standing Unsupported, Alternately Place Feet on Step/Stool Needs assistance to keep from falling or unable to try    Standing Unsupported, One Foot in Front Able to take small step independently and hold 30 seconds    Standing on One Leg Tries to lift leg/unable to hold 3 seconds but remains standing independently    Total Score 42    Berg comment: Significant fall risk      Timed Up and Go Test   TUG Normal TUG    Normal TUG (seconds) 16.37   with RW            PATIENT EDUCATION: Education details: continue HEP, OM results and functional implications, PT POC Person educated: Patient Education method: Customer service manager Education comprehension: verbalized understanding, returned demonstration, and needs further education  HOME EXERCISE PROGRAM: Access Code: [email or web address] URL: [email or web address] Date: 09/22/2022 Prepared by: Elease Etienne  Exercises - Sit to Stand  - 1 x daily - 7 x weekly - 3 sets - 5 reps - Mini Squat with Counter Support  - 1 x daily - 7 x weekly - 3 sets - 10 reps - Heel Raises with Counter Support  - 1 x daily - 7 x weekly - 3 sets - 10 reps - Supine Bridge  - 1 x daily - 7 x weekly - 3 sets - 10 reps - Supine Single Bent Knee Fallout  - 1 x daily - 7 x weekly - 3 sets - 10 reps - Supine Single Knee to Chest Stretch  - 1 x daily - 7 x weekly - 2 sets - 5 reps - 30-60 sec hold - Supine Piriformis Stretch with Leg Straight  -  1 x daily - 7 x weekly - 2 sets - 5 reps - 30-60 sec hold - Seated Hamstring Stretch with Strap  - 1 x daily - 7 x weekly - 2 sets - 5 reps - 30-60 sec hold - Modified Thomas Stretch  - 1 x daily - 7 x weekly - 3 sets - 30-45 second hold - Alternating Step Taps with Counter Support  - 1 x daily - 7 x weekly - 3 sets - 10 reps   GOALS: Goals reviewed with patient? Yes  SHORT TERM GOALS: Target date: 10/06/2022  Pt will be independent with  initial HEP for improved strength, balance, transfers and gait. Baseline:  Established, recently updated, and pt reports compliance. Goal status: MET  2.  Pt will improve gait velocity to at least 2.0 ft/sec for improved gait efficiency and performance at mod I level  Baseline: 1.79 ft/sec (11/20); 2.62 ft/sec modI w/ RW (12/11) Goal status: MET  3.  Pt will ambulate >/=550 feet on to demonstrate improved functional endurance for home and community participation. Baseline: 465' w/ RW (11/27); 508' w/ RW (12/11) Goal status: IN PROGRESS  4.  Pt will increase BERG balance score to 44/56 to demonstrate improved static balance. Baseline: 38/56 (11/27); 40/56 (12/11) Goal status: IN PROGRESS   LONG TERM GOALS: Target date: 11/10/2022 (date updated to match certification date)  Pt will be independent with final HEP for improved strength, balance, transfers and gait. Baseline:  Goal status: IN PROGRESS  2.  Pt will improve gait velocity to at least 3.0 ft/sec for improved gait efficiency and performance at mod I level  Baseline: 1.79 ft/sec (11/20); 2.62 ft/sec modI w/ RW (12/11), 2.49 ft/sec mod I with RW (1/15) Goal status: IN PROGRESS  3.  Pt will improve normal TUG to less than or equal to 15 seconds for improved functional mobility and decreased fall risk. Baseline: 21.19 sec with RW (11/20), 16.37 sec with RW (1/15) Goal status: IN PROGRESS  4.  Pt will ambulate >650 feet on to demonstrate improved functional endurance for home and community participation. Baseline: 465' w/ RW (11/27), 508' w/ RW (12/11), 68' with RW (1/11), 585 ft with RW (1/15) Goal status: IN PROGRESS  5.  Pt will increase BERG balance score to 48/56 to demonstrate improved static balance. Baseline: 38/56 (11/27), 40/56 (12/11), 42/56 (1/15) Goal status: IN PROGRESS  6.  Pt will ambulate up and down 12 stairs w/ reciprocal gait at mod I level using rail and cane as needed to improve safe access to  home and community environments. Baseline: 4 stairs step to w/ BUE on left rail Goal status: IN PROGRESS   SHORT TERM GOALS:   Target date: 12/08/2022   1.  Pt will improve gait velocity to at least 2.75 ft/sec for improved gait efficiency and performance at mod I level  Baseline: 1.79 ft/sec (11/20); 2.62 ft/sec modI w/ RW (12/11), 2.49 ft/sec mod I with RW (1/15) Goal status: IN PROGRESS  2.  Pt will improve normal TUG to less than or equal to 15 seconds for improved functional mobility and decreased fall risk. Baseline: 21.19 sec with RW (11/20), 16.37 sec with RW (1/15) Goal status: IN PROGRESS  3.  Pt will ambulate >600 feet on to demonstrate improved functional endurance for home and community participation. Baseline: 465' w/ RW (11/27), 508' w/ RW (12/11), 10' with RW (1/11), 585 ft with RW (1/15) Goal status: IN PROGRESS  4.  Pt will  improve Berg score to 44/56 for decreased fall risk Baseline: 38/56 (11/27), 40/56 (12/11), 42/56 (1/15) Goal status: IN PROGRESS   LONG TERM GOALS:  Target date: 01/05/2023  Pt will be independent with final HEP for improved strength, balance, transfers and gait. Baseline:  Goal status: INITIAL  2.  Pt will ambulate up and down 12 stairs w/ reciprocal gait at mod I level using rail and cane as needed to improve safe access to home and community environments. Baseline: 4 stairs step to w/ BUE on left rail Goal status: INITIAL   ASSESSMENT:  CLINICAL IMPRESSION: Emphasis of skilled PT session on reassessing LTG for recertification of PT services this date. Pt is making progress towards 6/6 LTG due to progressing towards being independent with his HEP, improving his gait speed from 1.79 ft/sec initially to 2.49 ft/sec this date, improving his TUG score from 21.19 sec initially to 16.37 sec this date, improving his endurance by covering 585 ft on the as compared to 465 ft initially, and improving his balance by demonstrating an  improved score on the Berg from 38/56 initially to 42/56 this date. Overall pt demonstrates a decreased fall risk and improved balance based on these scores and pt has shown improvement in all areas. Pt does continue to exhibit gait deviations such as decreased L hip and knee flexion during gait as well as ongoing L hemibody weakness and decreased flexibility and ROM which is limiting for his functional mobility. Pt continues to benefit from skilled therapy services to increase his safety and independence with functional mobility. Continue POC.    OBJECTIVE IMPAIRMENTS: Abnormal gait, cardiopulmonary status limiting activity, decreased activity tolerance, decreased balance, decreased endurance, decreased strength, increased edema, impaired perceived functional ability, and impaired sensation.   ACTIVITY LIMITATIONS: carrying, lifting, bending, squatting, stairs, and reach over head  PARTICIPATION LIMITATIONS: driving, community activity, occupation, and church  PERSONAL FACTORS: chronic systolic heart failure, hx of MI, heart failure, cardiac cath April 2023 and stent, HTN, renal disorder, CVA March 2023 are also affecting patient's functional outcome.   REHAB POTENTIAL: Good  CLINICAL DECISION MAKING: Stable/uncomplicated  EVALUATION COMPLEXITY: Moderate  PLAN:  PT FREQUENCY: 1-2x/week  PT DURATION: 6 weeks+ 15 visits (recert)  PLANNED INTERVENTIONS: Therapeutic exercises, Therapeutic activity, Neuromuscular re-education, Balance training, Gait training, Patient/Family education, Self Care, Joint mobilization, Stair training, Vestibular training, Canalith repositioning, Visual/preceptual remediation/compensation, Orthotic/Fit training, DME instructions, Aquatic Therapy, Dry Needling, Electrical stimulation, Cryotherapy, Moist heat, Taping, Manual therapy, and Re-evaluation  PLAN FOR NEXT SESSION:  delete old goals, TKE (quad strength), work towards floor transfers if safe and able,Try curb  step with RW and or/SPC, add to HEP for strengthening/ROM/balance, hip flexor strengthening to HEP, try hurricane outdoors on uneven ground depending on weather or indoors across uneven mat, provide walking program  Peter Congo, PT, DPT, CSRS 11/10/2022, 3:02 PM

## 2022-11-12 ENCOUNTER — Ambulatory Visit: Payer: 59 | Admitting: Occupational Therapy

## 2022-11-12 DIAGNOSIS — G8929 Other chronic pain: Secondary | ICD-10-CM

## 2022-11-12 DIAGNOSIS — M6281 Muscle weakness (generalized): Secondary | ICD-10-CM | POA: Diagnosis not present

## 2022-11-12 DIAGNOSIS — R2689 Other abnormalities of gait and mobility: Secondary | ICD-10-CM

## 2022-11-12 DIAGNOSIS — R208 Other disturbances of skin sensation: Secondary | ICD-10-CM

## 2022-11-12 DIAGNOSIS — R2681 Unsteadiness on feet: Secondary | ICD-10-CM

## 2022-11-12 DIAGNOSIS — M25612 Stiffness of left shoulder, not elsewhere classified: Secondary | ICD-10-CM

## 2022-11-12 NOTE — Therapy (Signed)
OUTPATIENT OCCUPATIONAL THERAPY NEURO TREATMENT  Patient Name: Christopher Morales MRN: [id number] DOB:1971/08/31, 52 y.o., male Today's Date: 11/12/2022  PCP:  Leotis Pain, MD REFERRING PROVIDER:  Leotis Pain, MD  END OF SESSION:  OT End of Session - 11/12/22 1322     Visit Number 3    Number of Visits 17    Authorization Type [employer] 30 VL    OT Start Time 1320    OT Stop Time 1400    OT Time Calculation (min) 40 min             Past Medical History:  Diagnosis Date   CHF (congestive heart failure) ([place])    Hypertension    Renal disorder    No past surgical history on file. Patient Active Problem List   Diagnosis Date Noted   OSA (obstructive sleep apnea) 09/24/2022   Chronic combined systolic and diastolic congestive heart failure ([place]) 09/24/2022   Nocturnal hypoxia 09/24/2022    ONSET DATE: 09/10/2022  REFERRING DIAG: M25.519 (ICD-10-CM) - Pain in unspecified shoulder G89.29 (ICD-10-CM) - Chronic pain R26.89 (ICD-10-CM) - Other abnormalities of gait and mobility R29.898 (ICD-10-CM) - Other symptoms and signs involving the musculoskeletal system Z86.73 (ICD-10-CM) - History of stroke    THERAPY DIAG:  Muscle weakness (generalized)  Unsteadiness on feet  Other abnormalities of gait and mobility  Stiffness of left shoulder, not elsewhere classified  Chronic left shoulder pain  Other disturbances of skin sensation  Rationale for Evaluation and Treatment: Rehabilitation  SUBJECTIVE:   SUBJECTIVE STATEMENT: Pt denies pain   Pt accompanied by: self  PERTINENT HISTORY: Patient is a 52 year old male referred to Neuro OPPT for CVA.   Pt's PMH is significant for: chronic systolic heart failure, hx of MI, heart failure, cardiac cath April 2023 and stent, HTN, renal disorder, CVA March 2023. The following deficits were present during the exam: decreased gait speed, decreased LE strength, and increased fall risk. Based on his history of falls, gait speed of 1.79  ft/sec, and TUG score of 21.19 sec, pt is an increased risk for falls. Pt would benefit from skilled PT to address these impairments and functional limitations to maximize functional mobility independence  PRECAUTIONS: Fall  WEIGHT BEARING RESTRICTIONS: No  PAIN:  Are you having pain? No, but I do have some pain in my Lt shoulder at end of day. My numbness remains  FALLS: Has patient fallen in last 6 months? Yes. Number of falls 1  LIVING ENVIRONMENT: Lives with: lives with their family Lives in: House/apartment Stairs: Yes: External: 3 steps; on left going up Has following equipment at home: Single point cane, Walker - 2 wheeled, Wheelchair (manual), Shower bench, and bed side commode  PLOF: Independent with basic ADLs and Independent with household mobility with device  PATIENT GOALS: return to work, regain arm function  OBJECTIVE:   HAND DOMINANCE: Right  ADLs: Overall ADLs: Modified Independent Transfers/ambulation related to ADLs: Eating: mod I Grooming: mod I UB Dressing: mod I LB Dressing: mod I Toileting: mod I Bathing: mod I Tub Shower transfers: has tub transfer bench, and toilet surround Equipment: Transfer tub bench and bed side commode  IADLs:  Light housekeeping: Is doing laundry  Meal Prep: Is not cooking Community mobility: Is not driving   POSTURE COMMENTS:  rounded shoulders, forward head, and posterior pelvic tilt   ACTIVITY TOLERANCE: Activity tolerance: Mild impairment   UPPER EXTREMITY ROM:    Active ROM Right eval Left eval  Shoulder flexion WFL AROM 80/ PROM110  Shoulder abduction  70  Shoulder adduction    Shoulder extension    Shoulder internal rotation    Shoulder external rotation    Elbow flexion  -25  Elbow extension  -10  Wrist flexion    Wrist extension  30  Wrist ulnar deviation    Wrist radial deviation    Wrist pronation    Wrist supination    (Blank rows = not tested)  UPPER EXTREMITY MMT:     MMT  Right eval Left eval  Shoulder flexion 4+/5 NT  Shoulder abduction Thruout   Shoulder adduction    Shoulder extension    Shoulder internal rotation    Shoulder external rotation    Middle trapezius    Lower trapezius    Elbow flexion    Elbow extension    Wrist flexion    Wrist extension    Wrist ulnar deviation    Wrist radial deviation    Wrist pronation    Wrist supination    (Blank rows = not tested)  HAND FUNCTION: Grip strength: Right: 76 lbs; Left: 26 lbs   COORDINATION: 9 Hole Peg test: Right: 35 sec; Left: 54min52  sec  SENSATION: Light touch: Impaired  Proprioception: Impaired     MUSCLE TONE: LUE: Moderate and Hypertonic  COGNITION: Overall cognitive status: Impaired  VISION: Subjective report: Reports having nystagmus initially Baseline vision: No visual deficits Visual history:  NA  VISION ASSESSMENT: WFL    PERCEPTION: Impaired: Inattention/neglect: does not attend to left side of body  PRAXIS: WFL    TODAY'S TREATMENT:                                                                                                                              Supine closed chain chest press and shoulder flexion, min v.c for positioning, closed chain self ROM, then butterfly position/ stretch with max difficulty/ v.c for positioning due to impaired sensation Sitting closed chain low range shoulder flexion reaching down, then to lap Weightbearing through L hand leaning to left to weight bear though elbow  PATIENT EDUCATION: Education details: see above Person educated: Patient Education method: Explanation, demo, min v.c Education comprehension: verbalized understanding, returned demonstration, and verbal cues required  HOME EXERCISE PROGRAM: Patient will bring HEP from [facility] therapy next visit   GOALS: Goals reviewed with patient? No  SHORT TERM GOALS: Target date: 11/09/22  Patient will complete an HEP designed to improve range of motion in left  shoulder  Goal status: MET  2.  Patient will demonstrate effectively low reach to obtain and replace a lightweight (less than 2lb) object from counter height in standing  Goal status: INITIAL  3.  Patient will demonstrate at least 100 degrees of active shoulder flexion in supine as preparation for mid level reach  Goal status: INITIAL  4.  Patient will report pain no greater than 2/10 with passive range of motion  to 120 degrees of shoulder flexion  Goal status: INITIAL    LONG TERM GOALS: Target date: 12/10/22  Patient will complete HEP designed to improve Left grip strength  Goal status: INITIAL  2.  Patient will demonstrate at least 90* of active shoulder flexion in upright position without lateral trunk flexion or excessive scapular elevation in preparation for mid level reach  Goal status: INITIAL  3.  Patient will demonstrate at least a 5 lb increase in left grip strength Baseline: 26lb Goal status: INITIAL  4.  Patient will demonstrate improved left coordination as evidenced by decreased time on 9 hole peg test to 1 min 30 sec or less.  Baseline: 48min 52 sec Goal status: INITIAL  5.  Patient will shower with modified independence  Goal status: INITIAL  6.  Patient will prepare simple meal with modified independence  Goal status: INITIAL  ASSESSMENT:  CLINICAL IMPRESSION: Patient  is progressing towards goals. He remains limited by sensation in LUE.  PERFORMANCE DEFICITS: in functional skills including ADLs, IADLs, coordination, dexterity, proprioception, sensation, tone, ROM, strength, pain, fascial restrictions, flexibility, Fine motor control, Gross motor control, mobility, balance, body mechanics, and UE functional use, cognitive skills including attention IMPAIRMENTS: are limiting patient from ADLs, IADLs, and work.   CO-MORBIDITIES: may have co-morbidities  that affects occupational performance. Patient will benefit from skilled OT to address above  impairments and improve overall function.  MODIFICATION OR ASSISTANCE TO COMPLETE EVALUATION: Min-Moderate modification of tasks or assist with assess necessary to complete an evaluation.  OT OCCUPATIONAL PROFILE AND HISTORY: Detailed assessment: Review of records and additional review of physical, cognitive, psychosocial history related to current functional performance.  CLINICAL DECISION MAKING: Moderate - several treatment options, min-mod task modification necessary  REHAB POTENTIAL: Good  EVALUATION COMPLEXITY: Moderate    PLAN:  OT FREQUENCY: 2x/week  OT DURATION: 8 weeks  PLANNED INTERVENTIONS: self care/ADL training, therapeutic exercise, therapeutic activity, neuromuscular re-education, manual therapy, passive range of motion, balance training, functional mobility training, aquatic therapy, cognitive remediation/compensation, visual/perceptual remediation/compensation, and DME and/or AE instructions  RECOMMENDED OTHER SERVICES: NA  CONSULTED AND AGREED WITH PLAN OF CARE: Patient  PLAN FOR NEXT SESSION: issue handout for weightbearing neuro-reeducation (body on arm and closed chain AA/ROM LUE), low to mid range functional use LUE   Myson Levi, OT 11/12/2022, 1:22 PM

## 2022-11-17 ENCOUNTER — Encounter: Payer: 59 | Admitting: Occupational Therapy

## 2022-11-17 ENCOUNTER — Ambulatory Visit: Payer: 59 | Admitting: Physical Therapy

## 2022-11-17 DIAGNOSIS — M6281 Muscle weakness (generalized): Secondary | ICD-10-CM | POA: Diagnosis not present

## 2022-11-17 DIAGNOSIS — R2689 Other abnormalities of gait and mobility: Secondary | ICD-10-CM

## 2022-11-17 DIAGNOSIS — R2681 Unsteadiness on feet: Secondary | ICD-10-CM

## 2022-11-17 NOTE — Therapy (Signed)
OUTPATIENT PHYSICAL THERAPY NEURO TREATMENT   Patient Name: Christopher Morales MRN: [id number] DOB:[birth year], 52 y.o., male Today's Date: 11/17/2022   PCP: Leotis Pain, MD REFERRING PROVIDER: Leotis Pain, MD     END OF SESSION:  PT End of Session - 11/17/22 1405     Visit Number 14    Number of Visits 28   recert   Date for PT Re-Evaluation 90/24/09   recert   Authorization Type [employer]    Authorization Time Period 23 visit limit PT/OT    Authorization - Visit Number 4    Authorization - Number of Visits 15   30 visit limit PT/OT   Progress Note Due on Visit 10    PT Start Time 7353    PT Stop Time 1449    PT Time Calculation (min) 44 min    Equipment Utilized During Treatment Gait belt    Activity Tolerance Patient tolerated treatment well    Behavior During Therapy WFL for tasks assessed/performed                       Past Medical History:  Diagnosis Date   CHF (congestive heart failure) ([place])    Hypertension    Renal disorder    No past surgical history on file. Patient Active Problem List   Diagnosis Date Noted   OSA (obstructive sleep apnea) 09/24/2022   Chronic combined systolic and diastolic congestive heart failure ([place]) 09/24/2022   Nocturnal hypoxia 09/24/2022    ONSET DATE: 09/10/2022  REFERRING DIAG: M25.519 (ICD-10-CM) - Pain in unspecified shoulder G89.29 (ICD-10-CM) - Chronic pain R26.89 (ICD-10-CM) - Other abnormalities of gait and mobility R29.898 (ICD-10-CM) - Other symptoms and signs involving the musculoskeletal system Z86.73 (ICD-10-CM) - History of stroke  THERAPY DIAG:  Muscle weakness (generalized)  Unsteadiness on feet  Other abnormalities of gait and mobility  Rationale for Evaluation and Treatment: Rehabilitation  SUBJECTIVE:                                                                                                                                                                                              SUBJECTIVE STATEMENT: Pt reports he had some L shoulder soreness after PNA vaccine last Friday but it is feeling better today. Pt reports ongoing tightness in his L side overall.  Pt accompanied by: self  PERTINENT HISTORY: chronic systolic heart failure, hx of MI, heart failure, cardiac cath April 2023 and stent, HTN, renal disorder, CVA March 2023  PAIN:  Are you having pain? No  PRECAUTIONS: Fall  WEIGHT BEARING RESTRICTIONS: No  OBJECTIVE:  TODAY'S TREATMENT:           THER ACT: In // bars to work on LLE clearance and step height during gait: Forwards/backwards foam beam step-overs with alt LE 2 x 10 reps each Lateral foam beam step-overs with alt LE 2 x 10 reps each Pt requires BUE support to maintain standing balance and perform exercise without compensations  THER EX: SciFit multi-peaks level 1.5 for 5 minutes using BUE/BLEs for neural priming for reciprocal movement, dynamic cardiovascular warmup and increased amplitude of stepping.  Supine thomas stretch on mat table 3 x 30 sec each bilaterally Sideling PROM hip flexor stretch 3 x 30 sec each   PATIENT EDUCATION: Education details: continue HEP, PT POC with visit limit Person educated: Patient Education method: Customer service manager Education comprehension: verbalized understanding, returned demonstration, and needs further education  HOME EXERCISE PROGRAM: Access Code: [email or web address] URL: [email or web address] Date: 09/22/2022 Prepared by: Elease Etienne  Exercises - Sit to Stand  - 1 x daily - 7 x weekly - 3 sets - 5 reps - Mini Squat with Counter Support  - 1 x daily - 7 x weekly - 3 sets - 10 reps - Heel Raises with Counter Support  - 1 x daily - 7 x weekly - 3 sets - 10 reps - Supine Bridge  - 1 x daily - 7 x weekly - 3 sets - 10 reps - Supine Single Bent Knee Fallout  - 1 x daily - 7 x weekly - 3 sets - 10 reps - Supine Single Knee to Chest Stretch  - 1 x daily - 7 x weekly - 2  sets - 5 reps - 30-60 sec hold - Supine Piriformis Stretch with Leg Straight  - 1 x daily - 7 x weekly - 2 sets - 5 reps - 30-60 sec hold - Seated Hamstring Stretch with Strap  - 1 x daily - 7 x weekly - 2 sets - 5 reps - 30-60 sec hold - Modified Thomas Stretch  - 1 x daily - 7 x weekly - 3 sets - 30-45 second hold - Alternating Step Taps with Counter Support  - 1 x daily - 7 x weekly - 3 sets - 10 reps   GOALS: Goals reviewed with patient? Yes   NEW SHORT TERM GOALS:   Target date: 12/08/2022   1.  Pt will improve gait velocity to at least 2.75 ft/sec for improved gait efficiency and performance at mod I level  Baseline: 1.79 ft/sec (11/20); 2.62 ft/sec modI w/ RW (12/11), 2.49 ft/sec mod I with RW (1/15) Goal status: IN PROGRESS  2.  Pt will improve normal TUG to less than or equal to 15 seconds for improved functional mobility and decreased fall risk. Baseline: 21.19 sec with RW (11/20), 16.37 sec with RW (1/15) Goal status: IN PROGRESS  3.  Pt will ambulate >600 feet on 6MWT to demonstrate improved functional endurance for home and community participation. Baseline: 465' w/ RW (11/27), 508' w/ RW (12/11), 66' with RW (1/11), 585 ft with RW (1/15) Goal status: IN PROGRESS  4.  Pt will improve Berg score to 44/56 for decreased fall risk Baseline: 38/56 (11/27), 40/56 (12/11), 42/56 (1/15) Goal status: IN PROGRESS   NEW LONG TERM GOALS:  Target date: 01/05/2023  Pt will be independent with final HEP for improved strength, balance, transfers and gait. Baseline:  Goal status: INITIAL  2.  Pt will ambulate up and down 12 stairs w/ reciprocal gait at mod I level  using rail and cane as needed to improve safe access to home and community environments. Baseline: 4 stairs step to w/ BUE on left rail Goal status: INITIAL   ASSESSMENT:  CLINICAL IMPRESSION: Emphasis of skilled PT session on working on hip stretching followed by practice of increasing LLE step height and step  clearance during gait. Pt continues to exhibit decreased B hip mobility (L>R) and ongoing LLE stiffness during gait leading to decreased hip and knee flexion during gait. Pt continues to benefit from skilled therapy services to address ongoing L hemibody weakness leading to decreased safety and independence with functional mobility. Continue POC.    OBJECTIVE IMPAIRMENTS: Abnormal gait, cardiopulmonary status limiting activity, decreased activity tolerance, decreased balance, decreased endurance, decreased strength, increased edema, impaired perceived functional ability, and impaired sensation.   ACTIVITY LIMITATIONS: carrying, lifting, bending, squatting, stairs, and reach over head  PARTICIPATION LIMITATIONS: driving, community activity, occupation, and church  PERSONAL FACTORS: chronic systolic heart failure, hx of MI, heart failure, cardiac cath April 2023 and stent, HTN, renal disorder, CVA March 2023 are also affecting patient's functional outcome.   REHAB POTENTIAL: Good  CLINICAL DECISION MAKING: Stable/uncomplicated  EVALUATION COMPLEXITY: Moderate  PLAN:  PT FREQUENCY: 1-2x/week  PT DURATION: 6 weeks+ 15 visits (recert)  PLANNED INTERVENTIONS: Therapeutic exercises, Therapeutic activity, Neuromuscular re-education, Balance training, Gait training, Patient/Family education, Self Care, Joint mobilization, Stair training, Vestibular training, Canalith repositioning, Visual/preceptual remediation/compensation, Orthotic/Fit training, DME instructions, Aquatic Therapy, Dry Needling, Electrical stimulation, Cryotherapy, Moist heat, Taping, Manual therapy, and Re-evaluation  PLAN FOR NEXT SESSION: TKE (quad strength), work towards floor transfers if safe and able,Try curb step with RW and or/SPC, add to HEP for strengthening/ROM/balance, hip flexor strengthening to HEP, try hurricane outdoors on uneven ground depending on weather or indoors across uneven mat, provide walking  program  Peter Congo, PT, DPT, CSRS 11/17/2022, 3:55 PM

## 2022-11-19 ENCOUNTER — Ambulatory Visit: Payer: 59 | Admitting: Occupational Therapy

## 2022-11-19 DIAGNOSIS — R2681 Unsteadiness on feet: Secondary | ICD-10-CM

## 2022-11-19 DIAGNOSIS — M6281 Muscle weakness (generalized): Secondary | ICD-10-CM | POA: Diagnosis not present

## 2022-11-19 DIAGNOSIS — R2689 Other abnormalities of gait and mobility: Secondary | ICD-10-CM

## 2022-11-19 DIAGNOSIS — G8929 Other chronic pain: Secondary | ICD-10-CM

## 2022-11-19 DIAGNOSIS — M25612 Stiffness of left shoulder, not elsewhere classified: Secondary | ICD-10-CM

## 2022-11-19 NOTE — Patient Instructions (Addendum)
Sit on edge of bed or sofa place left hand beside you even with the bed,   Place right hand on left knee, lean to the side and bring elbow to the bed,   then push up through your left hand, 10 reps 2x day,  keep feet on the ground and keep weight forward   Use your left hand for light activities below shoulder height Do not lift weights with your left arm because it may place you at risk for injury Do not lift items that are: sharp, heavy, hot or breakable with your left hand since your sensation is not good in your left hand

## 2022-11-19 NOTE — Therapy (Signed)
OUTPATIENT OCCUPATIONAL THERAPY NEURO TREATMENT  Patient Name: Christopher Morales MRN: [id number] DOB:1970/12/26, 52 y.o., male Today's Date: 11/19/2022  PCP:  Wilfred Curtis, MD REFERRING PROVIDER:  Wilfred Curtis, MD  END OF SESSION:  OT End of Session - 11/19/22 1508     Visit Number 4    Number of Visits 17    Authorization Type [employer] 30 VL    OT Start Time 1448    OT Stop Time 1528    OT Time Calculation (min) 40 min    Activity Tolerance Patient tolerated treatment well    Behavior During Therapy WFL for tasks assessed/performed              Past Medical History:  Diagnosis Date   CHF (congestive heart failure) (HCC)    Hypertension    Renal disorder    No past surgical history on file. Patient Active Problem List   Diagnosis Date Noted   OSA (obstructive sleep apnea) 09/24/2022   Chronic combined systolic and diastolic congestive heart failure (HCC) 09/24/2022   Nocturnal hypoxia 09/24/2022    ONSET DATE: 09/10/2022  REFERRING DIAG: M25.519 (ICD-10-CM) - Pain in unspecified shoulder G89.29 (ICD-10-CM) - Chronic pain R26.89 (ICD-10-CM) - Other abnormalities of gait and mobility R29.898 (ICD-10-CM) - Other symptoms and signs involving the musculoskeletal system Z86.73 (ICD-10-CM) - History of stroke    THERAPY DIAG:  Muscle weakness (generalized)  Unsteadiness on feet  Other abnormalities of gait and mobility  Stiffness of left shoulder, not elsewhere classified  Chronic left shoulder pain  Rationale for Evaluation and Treatment: Rehabilitation  SUBJECTIVE:   SUBJECTIVE STATEMENT: Pt reports mild discomfort in left shoulder  Pt accompanied by: self  PERTINENT HISTORY: Patient is a 52 year old male referred to Neuro OPPT for CVA.   Pt's PMH is significant for: chronic systolic heart failure, hx of MI, heart failure, cardiac cath April 2023 and stent, HTN, renal disorder, CVA March 2023. The following deficits were present during the exam: decreased gait  speed, decreased LE strength, and increased fall risk. Based on his history of falls, gait speed of 1.79 ft/sec, and TUG score of 21.19 sec, pt is an increased risk for falls. Pt would benefit from skilled PT to address these impairments and functional limitations to maximize functional mobility independence  PRECAUTIONS: Fall  WEIGHT BEARING RESTRICTIONS: No  PAIN:  Are you having pain? Yes: NPRS scale: 4/10 Pain location: L shoulder Pain description: aching Aggravating factors: malpositioning Relieving factors: rest,    FALLS: Has patient fallen in last 6 months? Yes. Number of falls 1  LIVING ENVIRONMENT: Lives with: lives with their family Lives in: House/apartment Stairs: Yes: External: 3 steps; on left going up Has following equipment at home: Single point cane, Environmental consultant - 2 wheeled, Wheelchair (manual), Shower bench, and bed side commode  PLOF: Independent with basic ADLs and Independent with household mobility with device  PATIENT GOALS: return to work, regain arm function  OBJECTIVE:   HAND DOMINANCE: Right  ADLs: Overall ADLs: Modified Independent Transfers/ambulation related to ADLs: Eating: mod I Grooming: mod I UB Dressing: mod I LB Dressing: mod I Toileting: mod I Bathing: mod I Tub Shower transfers: has tub transfer bench, and toilet surround Equipment: Transfer tub bench and bed side commode  IADLs:  Light housekeeping: Is doing laundry  Meal Prep: Is not cooking Community mobility: Is not driving   POSTURE COMMENTS:  rounded shoulders, forward head, and posterior pelvic tilt   ACTIVITY TOLERANCE: Activity  tolerance: Mild impairment   UPPER EXTREMITY ROM:    Active ROM Right eval Left eval  Shoulder flexion WFL AROM 80/ PROM110  Shoulder abduction  70  Shoulder adduction    Shoulder extension    Shoulder internal rotation    Shoulder external rotation    Elbow flexion  -25  Elbow extension  -10  Wrist flexion    Wrist extension  30   Wrist ulnar deviation    Wrist radial deviation    Wrist pronation    Wrist supination    (Blank rows = not tested)  UPPER EXTREMITY MMT:     MMT Right eval Left eval  Shoulder flexion 4+/5 NT  Shoulder abduction Thruout   Shoulder adduction    Shoulder extension    Shoulder internal rotation    Shoulder external rotation    Middle trapezius    Lower trapezius    Elbow flexion    Elbow extension    Wrist flexion    Wrist extension    Wrist ulnar deviation    Wrist radial deviation    Wrist pronation    Wrist supination    (Blank rows = not tested)  HAND FUNCTION: Grip strength: Right: 76 lbs; Left: 26 lbs   COORDINATION: 9 Hole Peg test: Right: 35 sec; Left: 4min52  sec  SENSATION: Light touch: Impaired  Proprioception: Impaired     MUSCLE TONE: LUE: Moderate and Hypertonic  COGNITION: Overall cognitive status: Impaired  VISION: Subjective report: Reports having nystagmus initially Baseline vision: No visual deficits Visual history:  NA  VISION ASSESSMENT: WFL    PERCEPTION: Impaired: Inattention/neglect: does not attend to left side of body  PRAXIS: WFL    TODAY'S TREATMENT:                                                                                                                              Supine closed chain chest press and shoulder flexion, min v.c for positioning,  Weightbearing through L hand leaning to left to weight bear though elbow, min v.c Seated edge of mat to place and remove large to small pegs from semi circle with LUE min v.c and facilitation, for performance. Closed chain reaching for floor with boomwhacker, then bring to lap and performing chest press, min v.c for posture and positioning.  PATIENT EDUCATION: Education details: weightbearing exercise for LUE, pt was cautioned against use of weights, overhead reaching with LUE and avoiding items that are sharp, hot, heavy or breakable with LUE. Person educated:  Patient Education method: Explanation, demo, min v.c, handout Education comprehension: verbalized understanding, returned demonstration, and verbal cues required  HOME EXERCISE PROGRAM: Patient will bring HEP from [facility] therapy next visit   GOALS: Goals reviewed with patient? No  SHORT TERM GOALS: Target date: 11/09/22  Patient will complete an HEP designed to improve range of motion in left shoulder  Goal status: MET  2.  Patient will demonstrate effectively low reach  to obtain and replace a lightweight (less than 2lb) object from counter height in standing  Goal status: INITIAL  3.  Patient will demonstrate at least 100 degrees of active shoulder flexion in supine as preparation for mid level reach  Goal status: INITIAL  4.  Patient will report pain no greater than 2/10 with passive range of motion to 120 degrees of shoulder flexion  Goal status: INITIAL    LONG TERM GOALS: Target date: 12/10/22  Patient will complete HEP designed to improve Left grip strength  Goal status: INITIAL  2.  Patient will demonstrate at least 90* of active shoulder flexion in upright position without lateral trunk flexion or excessive scapular elevation in preparation for mid level reach  Goal status: INITIAL  3.  Patient will demonstrate at least a 5 lb increase in left grip strength Baseline: 26lb Goal status: INITIAL  4.  Patient will demonstrate improved left coordination as evidenced by decreased time on 9 hole peg test to 1 min 30 sec or less.  Baseline: 47min 52 sec Goal status: INITIAL  5.  Patient will shower with modified independence  Goal status: INITIAL  6.  Patient will prepare simple meal with modified independence  Goal status: INITIAL  ASSESSMENT:  CLINICAL IMPRESSION: Patient is progressing towards goals. He demonstrates improving LUE coordination and functional use.  PERFORMANCE DEFICITS: in functional skills including ADLs, IADLs, coordination, dexterity,  proprioception, sensation, tone, ROM, strength, pain, fascial restrictions, flexibility, Fine motor control, Gross motor control, mobility, balance, body mechanics, and UE functional use, cognitive skills including attention IMPAIRMENTS: are limiting patient from ADLs, IADLs, and work.   CO-MORBIDITIES: may have co-morbidities  that affects occupational performance. Patient will benefit from skilled OT to address above impairments and improve overall function.  MODIFICATION OR ASSISTANCE TO COMPLETE EVALUATION: Min-Moderate modification of tasks or assist with assess necessary to complete an evaluation.  OT OCCUPATIONAL PROFILE AND HISTORY: Detailed assessment: Review of records and additional review of physical, cognitive, psychosocial history related to current functional performance.  CLINICAL DECISION MAKING: Moderate - several treatment options, min-mod task modification necessary  REHAB POTENTIAL: Good  EVALUATION COMPLEXITY: Moderate    PLAN:  OT FREQUENCY: 2x/week  OT DURATION: 8 weeks  PLANNED INTERVENTIONS: self care/ADL training, therapeutic exercise, therapeutic activity, neuromuscular re-education, manual therapy, passive range of motion, balance training, functional mobility training, aquatic therapy, cognitive remediation/compensation, visual/perceptual remediation/compensation, and DME and/or AE instructions  RECOMMENDED OTHER SERVICES: NA  CONSULTED AND AGREED WITH PLAN OF CARE: Patient  PLAN FOR NEXT SESSION: start checking short term goals(body on arm and closed chain AA/ROM LUE), low to mid range functional use LUE   Deztinee Lohmeyer, OT 11/19/2022, 3:08 PM

## 2022-11-24 ENCOUNTER — Encounter: Payer: 59 | Admitting: Occupational Therapy

## 2022-11-24 ENCOUNTER — Ambulatory Visit: Payer: 59 | Admitting: Physical Therapy

## 2022-11-26 ENCOUNTER — Ambulatory Visit: Payer: 59 | Admitting: Occupational Therapy

## 2022-11-26 DIAGNOSIS — M6281 Muscle weakness (generalized): Secondary | ICD-10-CM

## 2022-11-26 DIAGNOSIS — R2681 Unsteadiness on feet: Secondary | ICD-10-CM

## 2022-11-26 DIAGNOSIS — R2689 Other abnormalities of gait and mobility: Secondary | ICD-10-CM

## 2022-11-26 DIAGNOSIS — M25612 Stiffness of left shoulder, not elsewhere classified: Secondary | ICD-10-CM

## 2022-11-26 NOTE — Therapy (Signed)
OUTPATIENT OCCUPATIONAL THERAPY NEURO TREATMENT  Patient Name: Christopher Morales MRN: [id number] DOB:June 13, 1971, 52 y.o., male Today's Date: 11/26/2022  PCP:  Leotis Pain, MD REFERRING PROVIDER:  Leotis Pain, MD  END OF SESSION:  OT End of Session - 11/26/22 1409     Visit Number 5    Number of Visits 17    Date for OT Re-Evaluation 12/10/22    Authorization Type [employer] 30 VL    Authorization - Visit Number 4   for 2024   Authorization - Number of Visits 15    OT Start Time 1404    OT Stop Time 1445    OT Time Calculation (min) 41 min    Activity Tolerance Patient tolerated treatment well    Behavior During Therapy WFL for tasks assessed/performed               Past Medical History:  Diagnosis Date   CHF (congestive heart failure) ([place])    Hypertension    Renal disorder    No past surgical history on file. Patient Active Problem List   Diagnosis Date Noted   OSA (obstructive sleep apnea) 09/24/2022   Chronic combined systolic and diastolic congestive heart failure ([place]) 09/24/2022   Nocturnal hypoxia 09/24/2022    ONSET DATE: 09/10/2022  REFERRING DIAG: M25.519 (ICD-10-CM) - Pain in unspecified shoulder G89.29 (ICD-10-CM) - Chronic pain R26.89 (ICD-10-CM) - Other abnormalities of gait and mobility R29.898 (ICD-10-CM) - Other symptoms and signs involving the musculoskeletal system Z86.73 (ICD-10-CM) - History of stroke    THERAPY DIAG:  Muscle weakness (generalized)  Unsteadiness on feet  Other abnormalities of gait and mobility  Stiffness of left shoulder, not elsewhere classified  Rationale for Evaluation and Treatment: Rehabilitation  SUBJECTIVE:   SUBJECTIVE STATEMENT: At  end of session re: quadraped- "That may be the most beneficial exercise I have done" Pt accompanied by: self  PERTINENT HISTORY: Patient is a 52 year old male referred to Neuro OPPT for CVA.   Pt's PMH is significant for: chronic systolic heart failure, hx of MI, heart failure,  cardiac cath April 2023 and stent, HTN, renal disorder, CVA March 2023. The following deficits were present during the exam: decreased gait speed, decreased LE strength, and increased fall risk. Based on his history of falls, gait speed of 1.79 ft/sec, and TUG score of 21.19 sec, pt is an increased risk for falls. Pt would benefit from skilled PT to address these impairments and functional limitations to maximize functional mobility independence  PRECAUTIONS: Fall  WEIGHT BEARING RESTRICTIONS: No  PAIN:  Are you having pain? Yes: NPRS scale: 4/10 Pain location: L shoulder Pain description: aching Aggravating factors: malpositioning Relieving factors: rest,    FALLS: Has patient fallen in last 6 months? Yes. Number of falls 1  LIVING ENVIRONMENT: Lives with: lives with their family Lives in: House/apartment Stairs: Yes: External: 3 steps; on left going up Has following equipment at home: Single point cane, Environmental consultant - 2 wheeled, Wheelchair (manual), Shower bench, and bed side commode  PLOF: Independent with basic ADLs and Independent with household mobility with device  PATIENT GOALS: return to work, regain arm function  OBJECTIVE:   HAND DOMINANCE: Right  ADLs: Overall ADLs: Modified Independent Transfers/ambulation related to ADLs: Eating: mod I Grooming: mod I UB Dressing: mod I LB Dressing: mod I Toileting: mod I Bathing: mod I Tub Shower transfers: has tub transfer bench, and toilet surround Equipment: Transfer tub bench and bed side commode  IADLs:  Light  housekeeping: Is doing laundry  Meal Prep: Is not cooking Community mobility: Is not driving   POSTURE COMMENTS:  rounded shoulders, forward head, and posterior pelvic tilt   ACTIVITY TOLERANCE: Activity tolerance: Mild impairment   UPPER EXTREMITY ROM:    Active ROM Right eval Left eval  Shoulder flexion WFL AROM 80/ PROM110  Shoulder abduction  70  Shoulder adduction    Shoulder extension     Shoulder internal rotation    Shoulder external rotation    Elbow flexion  -25  Elbow extension  -10  Wrist flexion    Wrist extension  30  Wrist ulnar deviation    Wrist radial deviation    Wrist pronation    Wrist supination    (Blank rows = not tested)  UPPER EXTREMITY MMT:     MMT Right eval Left eval  Shoulder flexion 4+/5 NT  Shoulder abduction Thruout   Shoulder adduction    Shoulder extension    Shoulder internal rotation    Shoulder external rotation    Middle trapezius    Lower trapezius    Elbow flexion    Elbow extension    Wrist flexion    Wrist extension    Wrist ulnar deviation    Wrist radial deviation    Wrist pronation    Wrist supination    (Blank rows = not tested)  HAND FUNCTION: Grip strength: Right: 76 lbs; Left: 26 lbs   COORDINATION: 9 Hole Peg test: Right: 35 sec; Left: 60min52  sec  SENSATION: Light touch: Impaired  Proprioception: Impaired     MUSCLE TONE: LUE: Moderate and Hypertonic  COGNITION: Overall cognitive status: Impaired  VISION: Subjective report: Reports having nystagmus initially Baseline vision: No visual deficits Visual history:  NA  VISION ASSESSMENT: WFL    PERCEPTION: Impaired: Inattention/neglect: does not attend to left side of body  PRAXIS: WFL    TODAY'S TREATMENT:                                                                                                                              Closed chain reaching for floor with boomwhacker, then bring to lap and performing chest press, min v.c for posture and positioning. Standing to place and remove graded clothespins from antennae with LUE, min difficulty, facillitation for weight shift Left. Standing while flipping large playing cards in standing with LUE, min v.c Quadraped over ball for core stability and UE strength with pt. lifting left and right UE's alternately, as well as tall kneeling elbows on ball and modified push up with ball under  chest, mod facilitation/ v.c  2 person assist for transition from quadraped to sitting for safety due to processing difficulties UE ranger in standing for shoulder flexion and circumduction, min facilitation. Pt's ambulation was improved at end of session.   PATIENT EDUCATION: Education details: weightbearing exercise for LUE, pt was cautioned against use of weights, overhead reaching with LUE and avoiding items  that are sharp, hot, heavy or breakable with LUE. Person educated: Patient Education method: Explanation, demo, min v.c, handout Education comprehension: verbalized understanding, returned demonstration, and verbal cues required  HOME EXERCISE PROGRAM: Patient will bring HEP from [facility] therapy next visit   GOALS: Goals reviewed with patient? No  SHORT TERM GOALS: Target date: 12/01/22  Patient will complete an HEP designed to improve range of motion in left shoulder  Goal status: MET  2.  Patient will demonstrate effectively low reach to obtain and replace a lightweight (less than 2lb) object from counter height in standing  Goal status:MET  3.  Patient will demonstrate at least 100 degrees of active shoulder flexion in supine as preparation for mid level reach  Goal status: ongoing  4.  Patient will report pain no greater than 2/10 with passive range of motion to 120 degrees of shoulder flexion  Goal status:  ongoing    LONG TERM GOALS: Target date: 12/10/22  Patient will complete HEP designed to improve Left grip strength  Goal status: INITIAL  2.  Patient will demonstrate at least 90* of active shoulder flexion in upright position without lateral trunk flexion or excessive scapular elevation in preparation for mid level reach  Goal status: INITIAL  3.  Patient will demonstrate at least a 5 lb increase in left grip strength Baseline: 26lb Goal status: INITIAL  4.  Patient will demonstrate improved left coordination as evidenced by decreased time on 9 hole peg  test to 1 min 30 sec or less.  Baseline: 64min 52 sec Goal status: INITIAL  5.  Patient will shower with modified independence  Goal status: INITIAL  6.  Patient will prepare simple meal with modified independence  Goal status: INITIAL  ASSESSMENT:  CLINICAL IMPRESSION: Patient is progressing towards goals. He demonstrates improving LUE  strength and functional use.  PERFORMANCE DEFICITS: in functional skills including ADLs, IADLs, coordination, dexterity, proprioception, sensation, tone, ROM, strength, pain, fascial restrictions, flexibility, Fine motor control, Gross motor control, mobility, balance, body mechanics, and UE functional use, cognitive skills including attention IMPAIRMENTS: are limiting patient from ADLs, IADLs, and work.   CO-MORBIDITIES: may have co-morbidities  that affects occupational performance. Patient will benefit from skilled OT to address above impairments and improve overall function.  MODIFICATION OR ASSISTANCE TO COMPLETE EVALUATION: Min-Moderate modification of tasks or assist with assess necessary to complete an evaluation.  OT OCCUPATIONAL PROFILE AND HISTORY: Detailed assessment: Review of records and additional review of physical, cognitive, psychosocial history related to current functional performance.  CLINICAL DECISION MAKING: Moderate - several treatment options, min-mod task modification necessary  REHAB POTENTIAL: Good  EVALUATION COMPLEXITY: Moderate    PLAN:  OT FREQUENCY: 2x/week  OT DURATION: 8 weeks  PLANNED INTERVENTIONS: self care/ADL training, therapeutic exercise, therapeutic activity, neuromuscular re-education, manual therapy, passive range of motion, balance training, functional mobility training, aquatic therapy, cognitive remediation/compensation, visual/perceptual remediation/compensation, and DME and/or AE instructions  RECOMMENDED OTHER SERVICES: NA  CONSULTED AND AGREED WITH PLAN OF CARE: Patient  PLAN FOR NEXT  SESSION: finish(body on arm and closed chain AA/ROM LUE),  quadraped, UE ranger, low to mid range functional use LUE   Avion Kutzer, OT 11/26/2022, 4:24 PM

## 2022-11-27 ENCOUNTER — Telehealth: Payer: Self-pay | Admitting: Cardiovascular Disease

## 2022-11-27 NOTE — Telephone Encounter (Signed)
What problem are you experiencing? Patient states that he thinks the pressure settings on his CPAP are too high. He states that by the end of the night his mask is either off or leaking.He does not think this can wait until his appt on 12/18/22 - please advise.  Who is your medical equipment company? [employer]   Please route to the sleep study assistant.

## 2022-11-28 NOTE — Telephone Encounter (Signed)
Left message to return a call to me with the type of mask he has. Download will also be pulled for Dr Claiborne Billings to review and make changes if needed.

## 2022-12-01 ENCOUNTER — Encounter: Payer: 59 | Admitting: Occupational Therapy

## 2022-12-01 ENCOUNTER — Ambulatory Visit: Payer: 59 | Attending: Internal Medicine | Admitting: Physical Therapy

## 2022-12-01 DIAGNOSIS — M25512 Pain in left shoulder: Secondary | ICD-10-CM | POA: Insufficient documentation

## 2022-12-01 DIAGNOSIS — M6281 Muscle weakness (generalized): Secondary | ICD-10-CM | POA: Insufficient documentation

## 2022-12-01 DIAGNOSIS — M25612 Stiffness of left shoulder, not elsewhere classified: Secondary | ICD-10-CM | POA: Diagnosis present

## 2022-12-01 DIAGNOSIS — R208 Other disturbances of skin sensation: Secondary | ICD-10-CM | POA: Insufficient documentation

## 2022-12-01 DIAGNOSIS — R2681 Unsteadiness on feet: Secondary | ICD-10-CM | POA: Diagnosis present

## 2022-12-01 DIAGNOSIS — R2689 Other abnormalities of gait and mobility: Secondary | ICD-10-CM | POA: Diagnosis present

## 2022-12-01 DIAGNOSIS — G8929 Other chronic pain: Secondary | ICD-10-CM | POA: Diagnosis present

## 2022-12-01 NOTE — Therapy (Signed)
OUTPATIENT PHYSICAL THERAPY NEURO TREATMENT   Patient Name: Christopher Morales MRN: [id number] DOB:1970-12-19, 52 y.o., male Today's Date: 12/01/2022   PCP: Leotis Pain, MD REFERRING PROVIDER: Leotis Pain, MD     END OF SESSION:  PT End of Session - 12/01/22 1403     Visit Number 15    Number of Visits 28   recert   Date for PT Re-Evaluation 12/87/86   recert   Authorization Type [employer]    Authorization Time Period 30 visit limit PT/OT    Authorization - Visit Number 5    Authorization - Number of Visits 15   30 visit limit PT/OT   Progress Note Due on Visit 10    PT Start Time 1400    PT Stop Time 1445    PT Time Calculation (min) 45 min    Equipment Utilized During Treatment Gait belt    Activity Tolerance Patient tolerated treatment well    Behavior During Therapy WFL for tasks assessed/performed                        Past Medical History:  Diagnosis Date   CHF (congestive heart failure) ([place])    Hypertension    Renal disorder    No past surgical history on file. Patient Active Problem List   Diagnosis Date Noted   OSA (obstructive sleep apnea) 09/24/2022   Chronic combined systolic and diastolic congestive heart failure ([place]) 09/24/2022   Nocturnal hypoxia 09/24/2022    ONSET DATE: 09/10/2022  REFERRING DIAG: M25.519 (ICD-10-CM) - Pain in unspecified shoulder G89.29 (ICD-10-CM) - Chronic pain R26.89 (ICD-10-CM) - Other abnormalities of gait and mobility R29.898 (ICD-10-CM) - Other symptoms and signs involving the musculoskeletal system Z86.73 (ICD-10-CM) - History of stroke  THERAPY DIAG:  Muscle weakness (generalized)  Unsteadiness on feet  Other abnormalities of gait and mobility  Rationale for Evaluation and Treatment: Rehabilitation  SUBJECTIVE:                                                                                                                                                                                              SUBJECTIVE STATEMENT: Pt reports that he is having more pain in his L heel today as well as increased soreness in his buttocks, 4/10. Pt reports he feels that he is moving around more and moving faster. Pt reports his HEP is going well, is doing it 1-2x/day.  Pt accompanied by: self  PERTINENT HISTORY: chronic systolic heart failure, hx of MI, heart failure, cardiac cath April 2023 and stent, HTN, renal disorder, CVA March 2023  PAIN:  Are you having pain? No  PRECAUTIONS: Fall  WEIGHT BEARING RESTRICTIONS: No  OBJECTIVE:    TODAY'S TREATMENT:           THER ACT: In // bars for LLE control: Forward/backward stool rolls with L knee on stool Pt with difficulty maintaining standing balance even with BUE support and min A, further attempts at this activity deferred due to decreased safety  THER EX: SciFit multi-peaks level 3 for 5 minutes using BUE/BLEs for neural priming for reciprocal movement, dynamic cardiovascular warmup and increased amplitude of stepping.  Standing LLE strengthening: TKE 3 x 15 reps with RTB 6" step taps with progression to resisted step taps with RTB, 3 x 10 reps   PATIENT EDUCATION: Education details: continue HEP, PT POC Person educated: Patient Education method: Customer service manager Education comprehension: verbalized understanding, returned demonstration, and needs further education  HOME EXERCISE PROGRAM: Access Code: [email or web address] URL: [email or web address] Date: 09/22/2022 Prepared by: Elease Etienne  Exercises - Sit to Stand  - 1 x daily - 7 x weekly - 3 sets - 5 reps - Mini Squat with Counter Support  - 1 x daily - 7 x weekly - 3 sets - 10 reps - Heel Raises with Counter Support  - 1 x daily - 7 x weekly - 3 sets - 10 reps - Supine Bridge  - 1 x daily - 7 x weekly - 3 sets - 10 reps - Supine Single Bent Knee Fallout  - 1 x daily - 7 x weekly - 3 sets - 10 reps - Supine Single Knee to Chest Stretch  - 1 x daily - 7  x weekly - 2 sets - 5 reps - 30-60 sec hold - Supine Piriformis Stretch with Leg Straight  - 1 x daily - 7 x weekly - 2 sets - 5 reps - 30-60 sec hold - Seated Hamstring Stretch with Strap  - 1 x daily - 7 x weekly - 2 sets - 5 reps - 30-60 sec hold - Modified Thomas Stretch  - 1 x daily - 7 x weekly - 3 sets - 30-45 second hold - Alternating Step Taps with Counter Support  - 1 x daily - 7 x weekly - 3 sets - 10 reps   GOALS: Goals reviewed with patient? Yes   NEW SHORT TERM GOALS:   Target date: 12/08/2022   1.  Pt will improve gait velocity to at least 2.75 ft/sec for improved gait efficiency and performance at mod I level  Baseline: 1.79 ft/sec (11/20); 2.62 ft/sec modI w/ RW (12/11), 2.49 ft/sec mod I with RW (1/15) Goal status: IN PROGRESS  2.  Pt will improve normal TUG to less than or equal to 15 seconds for improved functional mobility and decreased fall risk. Baseline: 21.19 sec with RW (11/20), 16.37 sec with RW (1/15) Goal status: IN PROGRESS  3.  Pt will ambulate >600 feet on 6MWT to demonstrate improved functional endurance for home and community participation. Baseline: 465' w/ RW (11/27), 508' w/ RW (12/11), 55' with RW (1/11), 585 ft with RW (1/15) Goal status: IN PROGRESS  4.  Pt will improve Berg score to 44/56 for decreased fall risk Baseline: 38/56 (11/27), 40/56 (12/11), 42/56 (1/15) Goal status: IN PROGRESS   NEW LONG TERM GOALS:  Target date: 01/05/2023  Pt will be independent with final HEP for improved strength, balance, transfers and gait. Baseline:  Goal status: INITIAL  2.  Pt will ambulate up and down 12 stairs  w/ reciprocal gait at mod I level using rail and cane as needed to improve safe access to home and community environments. Baseline: 4 stairs step to w/ BUE on left rail Goal status: INITIAL   ASSESSMENT:  CLINICAL IMPRESSION: Emphasis of skilled PT session on working on LLE strengthening and control to improve overall balance and gait  mechanics. Pt continues to exhibit decreased hip and knee flexion of LLE during gait though it does appear improved from initial assessment. Pt continues to exhibit decreased strength and control of this limb as compared to his RLE. Pt continues to benefit from skilled therapy services to address ongoing R hemibody weakness leading to increased fall risk and decreased safety and independence with functional mobility. Continue POC.    OBJECTIVE IMPAIRMENTS: Abnormal gait, cardiopulmonary status limiting activity, decreased activity tolerance, decreased balance, decreased endurance, decreased strength, increased edema, impaired perceived functional ability, and impaired sensation.   ACTIVITY LIMITATIONS: carrying, lifting, bending, squatting, stairs, and reach over head  PARTICIPATION LIMITATIONS: driving, community activity, occupation, and church  PERSONAL FACTORS: chronic systolic heart failure, hx of MI, heart failure, cardiac cath April 2023 and stent, HTN, renal disorder, CVA March 2023 are also affecting patient's functional outcome.   REHAB POTENTIAL: Good  CLINICAL DECISION MAKING: Stable/uncomplicated  EVALUATION COMPLEXITY: Moderate  PLAN:  PT FREQUENCY: 1-2x/week  PT DURATION: 6 weeks+ 15 visits (recert)  PLANNED INTERVENTIONS: Therapeutic exercises, Therapeutic activity, Neuromuscular re-education, Balance training, Gait training, Patient/Family education, Self Care, Joint mobilization, Stair training, Vestibular training, Canalith repositioning, Visual/preceptual remediation/compensation, Orthotic/Fit training, DME instructions, Aquatic Therapy, Dry Needling, Electrical stimulation, Cryotherapy, Moist heat, Taping, Manual therapy, and Re-evaluation  PLAN FOR NEXT SESSION: TKE (quad strength), work towards floor transfers if safe and able,Try curb step with RW and or/SPC, add to HEP for strengthening/ROM/balance, hip flexor strengthening to HEP, try hurricane outdoors on uneven  ground depending on weather or indoors across uneven mat, provide walking program, LE tband wrap, increasing L knee flexion with gait  Excell Seltzer, PT, DPT, CSRS 12/01/2022, 2:45 PM

## 2022-12-02 NOTE — Telephone Encounter (Signed)
Patient called back and informed me that he has a F[employer]P Simplus mask. I informed him I do not have one of those type mask, however I have one that is similar if he wants to come by to pick up a sample. Patient states that after his PT appointment he will have his mom come by to pick it up. Airtouch F-20 size medicum provided to the patient.

## 2022-12-03 ENCOUNTER — Ambulatory Visit: Payer: 59 | Admitting: Occupational Therapy

## 2022-12-04 ENCOUNTER — Telehealth: Payer: Self-pay | Admitting: *Deleted

## 2022-12-04 NOTE — Telephone Encounter (Signed)
Patient called in to say that after receiving the sample mask he prefers not to use it. He has stents in his heart and is afraid due to the magnetic clips. He was informed that per Dr Claiborne Billings and the [facility] rep that these masks are safe to use. The patient still feels uncomfortable and asks if there's a alternative. I recommended that he call [employer] and ask them if they have a mask that is comparable to what he has, but with the "hook" feature instead of the magnets. Patient voiced understanding and will call [employer].

## 2022-12-08 ENCOUNTER — Encounter: Payer: 59 | Admitting: Occupational Therapy

## 2022-12-08 ENCOUNTER — Ambulatory Visit: Payer: 59 | Admitting: Physical Therapy

## 2022-12-08 DIAGNOSIS — M6281 Muscle weakness (generalized): Secondary | ICD-10-CM

## 2022-12-08 DIAGNOSIS — R2681 Unsteadiness on feet: Secondary | ICD-10-CM

## 2022-12-08 DIAGNOSIS — R2689 Other abnormalities of gait and mobility: Secondary | ICD-10-CM

## 2022-12-08 NOTE — Therapy (Signed)
OUTPATIENT PHYSICAL THERAPY NEURO TREATMENT   Patient Name: Christopher Morales MRN: [id number] DOB:Mar 22, 1971, 52 y.o., male Today's Date: 12/08/2022   PCP: Leotis Pain, MD REFERRING PROVIDER: Leotis Pain, MD     END OF SESSION:  PT End of Session - 12/08/22 1451     Visit Number 16    Number of Visits 28   recert   Date for PT Re-Evaluation [id number]   recert   Authorization Type [employer]    Authorization Time Period 30 visit limit PT/OT    Authorization - Number of Visits 15   30 visit limit PT/OT   Progress Note Due on Visit 10    PT Start Time 1450   this therapist ran over with previous patient   PT Stop Time 1535    PT Time Calculation (min) 45 min    Equipment Utilized During Treatment Gait belt    Activity Tolerance Patient tolerated treatment well    Behavior During Therapy WFL for tasks assessed/performed                         Past Medical History:  Diagnosis Date   CHF (congestive heart failure) ([place])    Hypertension    Renal disorder    No past surgical history on file. Patient Active Problem List   Diagnosis Date Noted   OSA (obstructive sleep apnea) 09/24/2022   Chronic combined systolic and diastolic congestive heart failure ([place]) 09/24/2022   Nocturnal hypoxia 09/24/2022    ONSET DATE: 09/10/2022  REFERRING DIAG: M25.519 (ICD-10-CM) - Pain in unspecified shoulder G89.29 (ICD-10-CM) - Chronic pain R26.89 (ICD-10-CM) - Other abnormalities of gait and mobility R29.898 (ICD-10-CM) - Other symptoms and signs involving the musculoskeletal system Z86.73 (ICD-10-CM) - History of stroke  THERAPY DIAG:  Muscle weakness (generalized)  Unsteadiness on feet  Other abnormalities of gait and mobility  Rationale for Evaluation and Treatment: Rehabilitation  SUBJECTIVE:                                                                                                                                                                                              SUBJECTIVE STATEMENT: Pt reports that the pain in his L heel had resolved from the 8/10 it was last week but is back today, now it is 4/10. Pt reports that he used Voltarin gel and it was helpful but now the pain has returned.  Pt accompanied by: self  PERTINENT HISTORY: chronic systolic heart failure, hx of MI, heart failure, cardiac cath April 2023 and stent, HTN, renal disorder, CVA March 2023  PAIN:  Are you having pain? No  PRECAUTIONS: Fall  WEIGHT BEARING RESTRICTIONS: No  OBJECTIVE:    TODAY'S TREATMENT:           THER ACT: Reassessed for STG assessment:  [facility] PT Assessment - 12/08/22 1502       Ambulation/Gait   Gait velocity 32.8 ft over 11.4 sec = 2.89 ft/sec      Standardized Balance Assessment   Standardized Balance Assessment Berg Balance Test;Timed Up and Go Test      Berg Balance Test   Sit to Stand Able to stand without using hands and stabilize independently    Standing Unsupported Able to stand safely 2 minutes    Sitting with Back Unsupported but Feet Supported on Floor or Stool Able to sit safely and securely 2 minutes    Stand to Sit Sits safely with minimal use of hands    Transfers Able to transfer safely, minor use of hands    Standing Unsupported with Eyes Closed Able to stand 10 seconds safely    Standing Unsupported with Feet Together Able to place feet together independently and stand 1 minute safely    From Standing, Reach Forward with Outstretched Arm Can reach forward >12 cm safely (5")    From Standing Position, Pick up Object from Floor Able to pick up shoe, needs supervision    From Standing Position, Turn to Look Behind Over each Shoulder Looks behind one side only/other side shows less weight shift    Turn 360 Degrees Able to turn 360 degrees safely but slowly    Standing Unsupported, Alternately Place Feet on Step/Stool Able to complete >2 steps/needs minimal assist    Standing Unsupported, One Foot in Front Able to take small  step independently and hold 30 seconds    Standing on One Leg Tries to lift leg/unable to hold 3 seconds but remains standing independently    Total Score 43    Berg comment: significant fall risk      Timed Up and Go Test   TUG Normal TUG    Normal TUG (seconds) 16.1   RW            Attempted to assess L heel regarding ongoing pain in this region. Pt with no tenderness to touch of his L knee and no pain with multidirectional ankle PROM. Pt with no increase in pain with gastroc stretch. Recommended pt continue to monitor his pain and follow-up with his PCP is pain continues.  NMR: Trial gait with use of red theraband wrap to increase knee flexion during gait. Initially before use of theraband pt exhibits "stiff" LLE with decreased hip and knee flexion and decreased heel strike noted during gait. With addition of band pt exhibits increased L knee flexion and ankle DF during gait. Following removal of theraband pt continues to exhibit improved L knee flexion and ankle DF carryover. Will continue to utilize band during future sessions to work towards improving LLE mechanics during gait.    PATIENT EDUCATION: Education details: continue HEP, continue to monitor L heel pain Person educated: Patient Education method: Customer service manager Education comprehension: verbalized understanding, returned demonstration, and needs further education  HOME EXERCISE PROGRAM: Access Code: [facility] URL: [email or web address] Date: 09/22/2022 Prepared by: Elease Etienne  Exercises - Sit to Stand  - 1 x daily - 7 x weekly - 3 sets - 5 reps - Mini Squat with Counter Support  - 1 x daily - 7 x weekly - 3 sets - 10 reps -  Heel Raises with Counter Support  - 1 x daily - 7 x weekly - 3 sets - 10 reps - Supine Bridge  - 1 x daily - 7 x weekly - 3 sets - 10 reps - Supine Single Bent Knee Fallout  - 1 x daily - 7 x weekly - 3 sets - 10 reps - Supine Single Knee to Chest Stretch  -  1 x daily - 7 x weekly - 2 sets - 5 reps - 30-60 sec hold - Supine Piriformis Stretch with Leg Straight  - 1 x daily - 7 x weekly - 2 sets - 5 reps - 30-60 sec hold - Seated Hamstring Stretch with Strap  - 1 x daily - 7 x weekly - 2 sets - 5 reps - 30-60 sec hold - Modified Thomas Stretch  - 1 x daily - 7 x weekly - 3 sets - 30-45 second hold - Alternating Step Taps with Counter Support  - 1 x daily - 7 x weekly - 3 sets - 10 reps   GOALS: Goals reviewed with patient? Yes   NEW SHORT TERM GOALS:   Target date: 12/08/2022   1.  Pt will improve gait velocity to at least 2.75 ft/sec for improved gait efficiency and performance at mod I level  Baseline: 1.79 ft/sec (11/20); 2.62 ft/sec modI w/ RW (12/11), 2.49 ft/sec mod I with RW (1/15), 2.89 ft/sec mod I RW (2/12) Goal status: MET  2.  Pt will improve normal TUG to less than or equal to 15 seconds for improved functional mobility and decreased fall risk. Baseline: 21.19 sec with RW (11/20), 16.37 sec with RW (1/15), 16.1 sec with RW (2/12) Goal status: IN PROGRESS  3.  Pt will ambulate >600 feet on 6MWT to demonstrate improved functional endurance for home and community participation. Baseline: 465' w/ RW (11/27), 508' w/ RW (12/11), 34' with RW (1/11), 585 ft with RW (1/15) Goal status: IN PROGRESS  4.  Pt will improve Berg score to 44/56 for decreased fall risk Baseline: 38/56 (11/27), 40/56 (12/11), 42/56 (1/15), 43/56 (2/12) Goal status: IN PROGRESS   NEW LONG TERM GOALS:  Target date: 01/05/2023  Pt will be independent with final HEP for improved strength, balance, transfers and gait. Baseline:  Goal status: INITIAL  2.  Pt will ambulate up and down 12 stairs w/ reciprocal gait at mod I level using rail and cane as needed to improve safe access to home and community environments. Baseline: 4 stairs step to w/ BUE on left rail Goal status: INITIAL   ASSESSMENT:  CLINICAL IMPRESSION: Emphasis of skilled PT session on  reassessing STG and working on increasing LLE ankle DF and knee flexion during gait. Pt has met 1/3 STG that were reassessed this date and is progressing towards 3/4 STG. He has improved his gait speed from 1.79 ft/sec to 2.89 ft/sec this date, improved his TUG score from 21.19 sec initially to 16.1 sec this date, and improved his Berg score from 38/56 initially to 43/56 this date, indicating an overall decreased fall risk. Additionally, following use of theraband around LLE for increased proprioceptive input and assist with knee flexion pt exhibits improved gait mechanics with this limb during ambulation. Did not reassess 6MWT this date due to pt's heel pain, wil assess next session. Pt continues to benefit from skilled therapy services to address ongoing L hemibody weakness and decreased ROM leading to increased fall risk and decreased safety and independence with functional mobility. Continue POC.  OBJECTIVE IMPAIRMENTS: Abnormal gait, cardiopulmonary status limiting activity, decreased activity tolerance, decreased balance, decreased endurance, decreased strength, increased edema, impaired perceived functional ability, and impaired sensation.   ACTIVITY LIMITATIONS: carrying, lifting, bending, squatting, stairs, and reach over head  PARTICIPATION LIMITATIONS: driving, community activity, occupation, and church  PERSONAL FACTORS: chronic systolic heart failure, hx of MI, heart failure, cardiac cath April 2023 and stent, HTN, renal disorder, CVA March 2023 are also affecting patient's functional outcome.   REHAB POTENTIAL: Good  CLINICAL DECISION MAKING: Stable/uncomplicated  EVALUATION COMPLEXITY: Moderate  PLAN:  PT FREQUENCY: 1-2x/week  PT DURATION: 6 weeks+ 15 visits (recert)  PLANNED INTERVENTIONS: Therapeutic exercises, Therapeutic activity, Neuromuscular re-education, Balance training, Gait training, Patient/Family education, Self Care, Joint mobilization, Stair training, Vestibular  training, Canalith repositioning, Visual/preceptual remediation/compensation, Orthotic/Fit training, DME instructions, Aquatic Therapy, Dry Needling, Electrical stimulation, Cryotherapy, Moist heat, Taping, Manual therapy, and Re-evaluation  PLAN FOR NEXT SESSION: assess 6MWT for goal assess, TKE (quad strength), work towards floor transfers if safe and able,Try curb step with RW and or/SPC, add to HEP for strengthening/ROM/balance, hip flexor strengthening to HEP, try hurricane outdoors on uneven ground depending on weather or indoors across uneven mat, provide walking program, LE tband wrap, increasing L knee flexion with gait, how is L heel pain?  Excell Seltzer, PT, DPT, CSRS 12/08/2022, 3:37 PM

## 2022-12-10 ENCOUNTER — Ambulatory Visit: Payer: 59 | Admitting: Occupational Therapy

## 2022-12-10 DIAGNOSIS — R2689 Other abnormalities of gait and mobility: Secondary | ICD-10-CM

## 2022-12-10 DIAGNOSIS — R2681 Unsteadiness on feet: Secondary | ICD-10-CM

## 2022-12-10 DIAGNOSIS — M6281 Muscle weakness (generalized): Secondary | ICD-10-CM

## 2022-12-10 DIAGNOSIS — G8929 Other chronic pain: Secondary | ICD-10-CM

## 2022-12-10 DIAGNOSIS — R208 Other disturbances of skin sensation: Secondary | ICD-10-CM

## 2022-12-10 DIAGNOSIS — M25612 Stiffness of left shoulder, not elsewhere classified: Secondary | ICD-10-CM

## 2022-12-10 NOTE — Therapy (Signed)
OUTPATIENT OCCUPATIONAL THERAPY NEURO TREATMENT  Patient Name: Christopher Morales MRN: [id number] DOB:[birth year], 52 y.o., male Today's Date: 12/10/2022  PCP:  Leotis Pain, MD REFERRING PROVIDER:  Leotis Pain, MD  END OF SESSION:  OT End of Session - 12/10/22 1321     Visit Number 6    Number of Visits 17    Date for OT Re-Evaluation 12/10/22    Authorization Type [employer] 30 VL    Authorization - Number of Visits 15    OT Start Time 1318    OT Stop Time 1358    OT Time Calculation (min) 40 min    Activity Tolerance Patient tolerated treatment well    Behavior During Therapy WFL for tasks assessed/performed                Past Medical History:  Diagnosis Date   CHF (congestive heart failure) ([place])    Hypertension    Renal disorder    No past surgical history on file. Patient Active Problem List   Diagnosis Date Noted   OSA (obstructive sleep apnea) 09/24/2022   Chronic combined systolic and diastolic congestive heart failure ([place]) 09/24/2022   Nocturnal hypoxia 09/24/2022    ONSET DATE: 09/10/2022  REFERRING DIAG: M25.519 (ICD-10-CM) - Pain in unspecified shoulder G89.29 (ICD-10-CM) - Chronic pain R26.89 (ICD-10-CM) - Other abnormalities of gait and mobility R29.898 (ICD-10-CM) - Other symptoms and signs involving the musculoskeletal system Z86.73 (ICD-10-CM) - History of stroke    THERAPY DIAG:  Muscle weakness (generalized)  Unsteadiness on feet  Other abnormalities of gait and mobility  Stiffness of left shoulder, not elsewhere classified  Chronic left shoulder pain  Other disturbances of skin sensation  Rationale for Evaluation and Treatment: Rehabilitation  SUBJECTIVE:   SUBJECTIVE STATEMENT: Pt reports continues mild pain in left foot Pt accompanied by: self  PERTINENT HISTORY: Patient is a 52 year old male referred to Neuro OPPT for CVA.   Pt's PMH is significant for: chronic systolic heart failure, hx of MI, heart failure, cardiac cath April  2023 and stent, HTN, renal disorder, CVA March 2023. The following deficits were present during the exam: decreased gait speed, decreased LE strength, and increased fall risk. Based on his history of falls, gait speed of 1.79 ft/sec, and TUG score of 21.19 sec, pt is an increased risk for falls. Pt would benefit from skilled PT to address these impairments and functional limitations to maximize functional mobility independence  PRECAUTIONS: Fall  WEIGHT BEARING RESTRICTIONS: No  PAIN:  Are you having pain? Yes: NPRS scale: 3/10 Pain location: L shoulder Pain description: aching Aggravating factors: malpositioning Relieving factors: rest,  Foot pain 4/10, standing on if aggravates, rest alleviates   FALLS: Has patient fallen in last 6 months? Yes. Number of falls 1  LIVING ENVIRONMENT: Lives with: lives with their family Lives in: House/apartment Stairs: Yes: External: 3 steps; on left going up Has following equipment at home: Single point cane, Walker - 2 wheeled, Wheelchair (manual), Shower bench, and bed side commode  PLOF: Independent with basic ADLs and Independent with household mobility with device  PATIENT GOALS: return to work, regain arm function  OBJECTIVE:   HAND DOMINANCE: Right  ADLs: Overall ADLs: Modified Independent Transfers/ambulation related to ADLs: Eating: mod I Grooming: mod I UB Dressing: mod I LB Dressing: mod I Toileting: mod I Bathing: mod I Tub Shower transfers: has tub transfer bench, and toilet surround Equipment: Transfer tub bench and bed side commode  IADLs:  Light housekeeping: Is doing laundry  Meal Prep: Is not cooking Community mobility: Is not driving   POSTURE COMMENTS:  rounded shoulders, forward head, and posterior pelvic tilt   ACTIVITY TOLERANCE: Activity tolerance: Mild impairment   UPPER EXTREMITY ROM:    Active ROM Right eval Left eval  Shoulder flexion WFL AROM 80/ PROM110  Shoulder abduction  70  Shoulder  adduction    Shoulder extension    Shoulder internal rotation    Shoulder external rotation    Elbow flexion  -25  Elbow extension  -10  Wrist flexion    Wrist extension  30  Wrist ulnar deviation    Wrist radial deviation    Wrist pronation    Wrist supination    (Blank rows = not tested)  UPPER EXTREMITY MMT:     MMT Right eval Left eval  Shoulder flexion 4+/5 NT  Shoulder abduction Thruout   Shoulder adduction    Shoulder extension    Shoulder internal rotation    Shoulder external rotation    Middle trapezius    Lower trapezius    Elbow flexion    Elbow extension    Wrist flexion    Wrist extension    Wrist ulnar deviation    Wrist radial deviation    Wrist pronation    Wrist supination    (Blank rows = not tested)  HAND FUNCTION: Grip strength: Right: 76 lbs; Left: 26 lbs   COORDINATION: 9 Hole Peg test: Right: 35 sec; Left: 69mn52  sec  SENSATION: Light touch: Impaired  Proprioception: Impaired     MUSCLE TONE: LUE: Moderate and Hypertonic  COGNITION: Overall cognitive status: Impaired  VISION: Subjective report: Reports having nystagmus initially Baseline vision: No visual deficits Visual history:  NA  VISION ASSESSMENT: WFL    PERCEPTION: Impaired: Inattention/neglect: does not attend to left side of body  PRAXIS: WFL    TODAY'S TREATMENT:   12/10/22                                                                                                                            Closed chain reaching for floor with ball then bring to lap and performing chest press, min v.c for posture and positioning. Closed chain shoulder flexion with bilateral UE's, min v.c to avoid overusing RUE. Quadraped elbows on bench for core stability and UE strength with pt. Rocking forwards and back wards then elbow push ups transitioning to  hands on bench to push up into tall kneeling with modified pushups min A for balance. Seated low to midrange reaching to  place large pegs in pegboard with LUE, mod difficulty/ v.c for 3 pt pinch  PATIENT EDUCATION: See above  HOME EXERCISE PROGRAM:    GOALS: Goals reviewed with patient? No  SHORT TERM GOALS: Target date:01/08/23  Patient will complete an HEP designed to improve range of motion in left shoulder  Goal status: MET  2.  Patient will demonstrate effectively  low reach to obtain and replace a lightweight (less than 2lb) object from counter height in standing  Goal status:MET  3.  Patient will demonstrate at least 100 degrees of active shoulder flexion in supine as preparation for mid level reach  Goal status:met 115*  4.  Patient will report pain no greater than 2/10 with passive range of motion to 120 degrees of shoulder flexion  Goal status:  ongoing pain 3/10 at times    LONG TERM GOALS: Target date: 02/04/23  Patient will complete HEP designed to improve Left grip strength  Goal status:  met  2.  Patient will demonstrate at least 90* of active shoulder flexion in upright position without lateral trunk flexion or excessive scapular elevation in preparation for mid level reach. Revised- Patient will demonstrate at least 95* of active shoulder flexion in upright position with not more than min compensation in prep for functional reach  Goal status:met, 90*, with mod compensation, elbow flexion, goal updated   3.  Patient will demonstrate at least a 5 lb increase in left grip strength  Baseline: 26lb Goal status: 36.8 lbs 4.  Patient will demonstrate improved left coordination as evidenced by decreased time on 9 hole peg test to 1 min 30 sec or less. Revised goal-Pt will demonstrate improved fine motor coordination as evidenced by decreasing 9 hole peg test score to 1 min 24 secs Baseline: 68mn 52 sec Goal status: initial goal met 1 min 27 secs Goal updated    5.  Patient will shower with modified independence  Goal status: ongoing, unable to access the bathroom well,  currently sponge bathing  6.  Patient will prepare simple meal with modified independence  Goal status: ongoing needs assist   ASSESSMENT:  CLINICAL IMPRESSION: Pt demonstrates overall progress towards goals however he has not fully achieved all goals as he has not been seen to frequency. Pt can benefit from continued skilled occupational therapy to address LUE strength, coordination and functional use in order to maximize pt's safety and I with daily activities. PERFORMANCE DEFICITS: in functional skills including ADLs, IADLs, coordination, dexterity, proprioception, sensation, tone, ROM, strength, pain, fascial restrictions, flexibility, Fine motor control, Gross motor control, mobility, balance, body mechanics, and UE functional use, cognitive skills including attention IMPAIRMENTS: are limiting patient from ADLs, IADLs, and work.   CO-MORBIDITIES: may have co-morbidities  that affects occupational performance. Patient will benefit from skilled OT to address above impairments and improve overall function.  MODIFICATION OR ASSISTANCE TO COMPLETE EVALUATION: Min-Moderate modification of tasks or assist with assess necessary to complete an evaluation.  OT OCCUPATIONAL PROFILE AND HISTORY: Detailed assessment: Review of records and additional review of physical, cognitive, psychosocial history related to current functional performance.  CLINICAL DECISION MAKING: Moderate - several treatment options, min-mod task modification necessary  REHAB POTENTIAL: Good  EVALUATION COMPLEXITY: Moderate    PLAN:  OT FREQUENCY: 2x/week  OT DURATION: 8 weeks  PLANNED INTERVENTIONS: self care/ADL training, therapeutic exercise, therapeutic activity, neuromuscular re-education, manual therapy, passive range of motion, balance training, functional mobility training, aquatic therapy, cognitive remediation/compensation, visual/perceptual remediation/compensation, and DME and/or AE  instructions  RECOMMENDED OTHER SERVICES: NA  CONSULTED AND AGREED WITH PLAN OF CARE: Patient  PLAN FOR NEXT SESSION: check to see if pt needs to add visits or plans to save for the future, consider simple kitchen task?, weightbearing with bench Lanita Stammen, OT 12/10/2022, 1:21 PM

## 2022-12-15 ENCOUNTER — Encounter: Payer: 59 | Admitting: Occupational Therapy

## 2022-12-15 ENCOUNTER — Ambulatory Visit: Payer: 59 | Admitting: Physical Therapy

## 2022-12-15 DIAGNOSIS — R2681 Unsteadiness on feet: Secondary | ICD-10-CM

## 2022-12-15 DIAGNOSIS — M6281 Muscle weakness (generalized): Secondary | ICD-10-CM | POA: Diagnosis not present

## 2022-12-15 DIAGNOSIS — R2689 Other abnormalities of gait and mobility: Secondary | ICD-10-CM

## 2022-12-15 NOTE — Therapy (Signed)
OUTPATIENT PHYSICAL THERAPY NEURO TREATMENT   Patient Name: Christopher Morales MRN: [id number] DOB:1970-11-27, 52 y.o., male Today's Date: 12/15/2022   PCP: Leotis Pain, MD REFERRING PROVIDER: Leotis Pain, MD     END OF SESSION:  PT End of Session - 12/15/22 1403     Visit Number 17    Number of Visits 28   recert   Date for PT Re-Evaluation [id number]   recert   Authorization Type [employer]    Authorization Time Period 30 visit limit PT/OT    Authorization - Number of Visits 15   30 visit limit PT/OT   Progress Note Due on Visit 10    PT Start Time 1400    PT Stop Time 1447    PT Time Calculation (min) 47 min    Equipment Utilized During Treatment Gait belt    Activity Tolerance Patient tolerated treatment well    Behavior During Therapy WFL for tasks assessed/performed                          Past Medical History:  Diagnosis Date   CHF (congestive heart failure) ([place])    Hypertension    Renal disorder    No past surgical history on file. Patient Active Problem List   Diagnosis Date Noted   OSA (obstructive sleep apnea) 09/24/2022   Chronic combined systolic and diastolic congestive heart failure ([place]) 09/24/2022   Nocturnal hypoxia 09/24/2022    ONSET DATE: 09/10/2022  REFERRING DIAG: M25.519 (ICD-10-CM) - Pain in unspecified shoulder G89.29 (ICD-10-CM) - Chronic pain R26.89 (ICD-10-CM) - Other abnormalities of gait and mobility R29.898 (ICD-10-CM) - Other symptoms and signs involving the musculoskeletal system Z86.73 (ICD-10-CM) - History of stroke  THERAPY DIAG:  Muscle weakness (generalized)  Unsteadiness on feet  Other abnormalities of gait and mobility  Rationale for Evaluation and Treatment: Rehabilitation  SUBJECTIVE:                                                                                                                                                                                             SUBJECTIVE STATEMENT: Pt reports  ongoing pain in his L heel that worsens as the day progresses and has been limiting for him working on walking longer distances or transitioning to being able to use the cane. Pt reports he has reached out to his PCP who said he should reach out to an orthopedist for further assessment.  Pt accompanied by: self  PERTINENT HISTORY: chronic systolic heart failure, hx of MI, heart failure, cardiac cath April 2023 and stent, HTN, renal disorder,  CVA March 2023  PAIN:  Are you having pain? No  PRECAUTIONS: Fall  WEIGHT BEARING RESTRICTIONS: No  OBJECTIVE:    TODAY'S TREATMENT:           THER ACT: Pt declines to perform 6MWT reassessment due to ongoing L heel pain this date.  In // bars with BUE support: Calf and soleus stretch on 2" curb Calf and soleus stretch on incline wedge Calf and soleus stretch on ramp Will all stretches pt reports he feels hamstring stretch, no stretch detected in his heel cord though he does have ongoing numbness in this area and impaired sensation Heel raises x 10 reps Encouraged pt to keep working on these for his HEP Eccentric heel raises on 4" step x 10 reps  Staggered sit to stand from EOM to RW with focus on increasing WB through LLE with transfer and on eccentric control when sitting. Adjusted HEP from regular sit to stand to staggered sit to stand.  Seated L ankle resisted inversion with red theraband.  Added staggered sit to stands and resisted inversion to HEP, handouts and theraband provided. Encourged pt to continue with HEP otherwise if tolerated and not increasing his pain. Pt to schedule an appointment with orthopedist physician to further assess his ongoing L heel pain.    PATIENT EDUCATION: Education details: continue HEP and added to/adjusted HEP, continue to monitor L heel pain and schedule with orthopedic physician if pain persists Person educated: Patient Education method: Explanation, Demonstration, and Handouts Education  comprehension: verbalized understanding, returned demonstration, and needs further education  HOME EXERCISE PROGRAM: Access Code: [facility] URL: [email or web address] Date: 09/22/2022 Prepared by: Elease Etienne  Exercises - Sit to Stand  - 1 x daily - 7 x weekly - 3 sets - 5 reps - Mini Squat with Counter Support  - 1 x daily - 7 x weekly - 3 sets - 10 reps - Heel Raises with Counter Support  - 1 x daily - 7 x weekly - 3 sets - 10 reps - Supine Bridge  - 1 x daily - 7 x weekly - 3 sets - 10 reps - Supine Single Bent Knee Fallout  - 1 x daily - 7 x weekly - 3 sets - 10 reps - Supine Single Knee to Chest Stretch  - 1 x daily - 7 x weekly - 2 sets - 5 reps - 30-60 sec hold - Supine Piriformis Stretch with Leg Straight  - 1 x daily - 7 x weekly - 2 sets - 5 reps - 30-60 sec hold - Seated Hamstring Stretch with Strap  - 1 x daily - 7 x weekly - 2 sets - 5 reps - 30-60 sec hold - Modified Thomas Stretch  - 1 x daily - 7 x weekly - 3 sets - 30-45 second hold - Alternating Step Taps with Counter Support  - 1 x daily - 7 x weekly - 3 sets - 10 reps - Staggered Sit-to-Stand  - 1 x daily - 7 x weekly - 3 sets - 10 reps - Ankle Inversion with Resistance  - 1 x daily - 7 x weekly - 3 sets - 10 reps   GOALS: Goals reviewed with patient? Yes   NEW SHORT TERM GOALS:   Target date: 12/08/2022   1.  Pt will improve gait velocity to at least 2.75 ft/sec for improved gait efficiency and performance at mod I level  Baseline: 1.79 ft/sec (11/20); 2.62 ft/sec modI w/ RW (12/11), 2.49 ft/sec mod I  with RW (1/15), 2.89 ft/sec mod I RW (2/12) Goal status: MET  2.  Pt will improve normal TUG to less than or equal to 15 seconds for improved functional mobility and decreased fall risk. Baseline: 21.19 sec with RW (11/20), 16.37 sec with RW (1/15), 16.1 sec with RW (2/12) Goal status: IN PROGRESS  3.  Pt will ambulate >600 feet on 6MWT to demonstrate improved functional endurance for home and  community participation. Baseline: 465' w/ RW (11/27), 508' w/ RW (12/11), 4' with RW (1/11), 585 ft with RW (1/15) Goal status: IN PROGRESS  4.  Pt will improve Berg score to 44/56 for decreased fall risk Baseline: 38/56 (11/27), 40/56 (12/11), 42/56 (1/15), 43/56 (2/12) Goal status: IN PROGRESS   NEW LONG TERM GOALS:  Target date: 01/05/2023  Pt will be independent with final HEP for improved strength, balance, transfers and gait. Baseline:  Goal status: INITIAL  2.  Pt will ambulate up and down 12 stairs w/ reciprocal gait at mod I level using rail and cane as needed to improve safe access to home and community environments. Baseline: 4 stairs step to w/ BUE on left rail Goal status: INITIAL   ASSESSMENT:  CLINICAL IMPRESSION: Emphasis of skilled PT session on continuing to assess L heel pain and work on ROM and strengthening exercises to address pain as well as continue to work on LLE strengthening. Pt with inability to feel a stretch of L heel cord in various positions, possibly due to ongoing numbness and decreased sensation in this limb. Pt does benefit from adjustment of sit to stand exercise to increase WB through LLE during transfer as well as to work on eccentric control when sitting as he is noted to have R lateral lean in standing. Pt continues to benefit from skilled therapy services to address ongoing L hemibody weakness and gait impairments leading to decreased balance and increased fall risk. Continue POC.    OBJECTIVE IMPAIRMENTS: Abnormal gait, cardiopulmonary status limiting activity, decreased activity tolerance, decreased balance, decreased endurance, decreased strength, increased edema, impaired perceived functional ability, and impaired sensation.   ACTIVITY LIMITATIONS: carrying, lifting, bending, squatting, stairs, and reach over head  PARTICIPATION LIMITATIONS: driving, community activity, occupation, and church  PERSONAL FACTORS: chronic systolic heart  failure, hx of MI, heart failure, cardiac cath April 2023 and stent, HTN, renal disorder, CVA March 2023 are also affecting patient's functional outcome.   REHAB POTENTIAL: Good  CLINICAL DECISION MAKING: Stable/uncomplicated  EVALUATION COMPLEXITY: Moderate  PLAN:  PT FREQUENCY: 1-2x/week  PT DURATION: 6 weeks+ 15 visits (recert)  PLANNED INTERVENTIONS: Therapeutic exercises, Therapeutic activity, Neuromuscular re-education, Balance training, Gait training, Patient/Family education, Self Care, Joint mobilization, Stair training, Vestibular training, Canalith repositioning, Visual/preceptual remediation/compensation, Orthotic/Fit training, DME instructions, Aquatic Therapy, Dry Needling, Electrical stimulation, Cryotherapy, Moist heat, Taping, Manual therapy, and Re-evaluation  PLAN FOR NEXT SESSION: assess 6MWT for goal assess, TKE (quad strength), work towards floor transfers if safe and able,Try curb step with RW and or/SPC, add to HEP for strengthening/ROM/balance, hip flexor strengthening to HEP, try hurricane outdoors on uneven ground depending on weather or indoors across uneven mat, provide walking program, LE tband wrap, increasing L knee flexion with gait, how is L heel pain?; d/c from PT vs schedule more appointments?  Excell Seltzer, PT, DPT, CSRS 12/15/2022, 2:47 PM

## 2022-12-17 ENCOUNTER — Encounter: Payer: Self-pay | Admitting: Occupational Therapy

## 2022-12-17 ENCOUNTER — Ambulatory Visit: Payer: 59 | Admitting: Occupational Therapy

## 2022-12-17 DIAGNOSIS — M25612 Stiffness of left shoulder, not elsewhere classified: Secondary | ICD-10-CM

## 2022-12-17 DIAGNOSIS — R2689 Other abnormalities of gait and mobility: Secondary | ICD-10-CM

## 2022-12-17 DIAGNOSIS — M6281 Muscle weakness (generalized): Secondary | ICD-10-CM | POA: Diagnosis not present

## 2022-12-17 DIAGNOSIS — R2681 Unsteadiness on feet: Secondary | ICD-10-CM

## 2022-12-17 DIAGNOSIS — R208 Other disturbances of skin sensation: Secondary | ICD-10-CM

## 2022-12-17 DIAGNOSIS — G8929 Other chronic pain: Secondary | ICD-10-CM

## 2022-12-17 NOTE — Therapy (Signed)
OUTPATIENT OCCUPATIONAL THERAPY NEURO TREATMENT  Patient Name: Christopher Morales MRN: [id number] DOB:09-18-1971, 52 y.o., male Today's Date: 12/17/2022  PCP:  Leotis Pain, MD REFERRING PROVIDER:  Leotis Pain, MD   OCCUPATIONAL THERAPY DISCHARGE SUMMARY    Current functional level related to goals / functional outcomes: Pt made progress towards goals however he did not fully achieve goals due to not being seen to frequency and pt request for early d/c for financial/ insurance reasons/.   Remaining deficits: Sensory deficits, decreased strength, decreased coordination, decreased balance, decreased functional mobility   Education / Equipment: Pt was instructed in HEP. He demonstrates understanding.   Patient agrees to discharge. Patient goals were partially met. Patient is being discharged due to the patient's request. Pt desires to save visits for later in the year.    END OF SESSION:  OT End of Session - 12/17/22 1305     Visit Number 7    Number of Visits 17    Date for OT Re-Evaluation 02/04/23    Authorization Type [employer] VL    Authorization - Visit Number 5    Authorization - Number of Visits 15    OT Start Time 1318    OT Stop Time 1358    OT Time Calculation (min) 40 min    Activity Tolerance Patient tolerated treatment well    Behavior During Therapy WFL for tasks assessed/performed                 Past Medical History:  Diagnosis Date   CHF (congestive heart failure) ([place])    Hypertension    Renal disorder    No past surgical history on file. Patient Active Problem List   Diagnosis Date Noted   OSA (obstructive sleep apnea) 09/24/2022   Chronic combined systolic and diastolic congestive heart failure ([place]) 09/24/2022   Nocturnal hypoxia 09/24/2022    ONSET DATE: 09/10/2022  REFERRING DIAG: M25.519 (ICD-10-CM) - Pain in unspecified shoulder G89.29 (ICD-10-CM) - Chronic pain R26.89 (ICD-10-CM) - Other abnormalities of gait and mobility R29.898  (ICD-10-CM) - Other symptoms and signs involving the musculoskeletal system Z86.73 (ICD-10-CM) - History of stroke    THERAPY DIAG:  Muscle weakness (generalized)  Unsteadiness on feet  Other abnormalities of gait and mobility  Stiffness of left shoulder, not elsewhere classified  Chronic left shoulder pain  Other disturbances of skin sensation  Rationale for Evaluation and Treatment: Rehabilitation  SUBJECTIVE:   SUBJECTIVE STATEMENT: Pt reports continues mild pain in left foot Pt accompanied by: self  PERTINENT HISTORY: Patient is a 52 year old male referred to Neuro OPPT for CVA.   Pt's PMH is significant for: chronic systolic heart failure, hx of MI, heart failure, cardiac cath April 2023 and stent, HTN, renal disorder, CVA March 2023. The following deficits were present during the exam: decreased gait speed, decreased LE strength, and increased fall risk. Based on his history of falls, gait speed of 1.79 ft/sec, and TUG score of 21.19 sec, pt is an increased risk for falls. Pt would benefit from skilled PT to address these impairments and functional limitations to maximize functional mobility independence  PRECAUTIONS: Fall  WEIGHT BEARING RESTRICTIONS: No  PAIN:  Are you having pain? Yes: NPRS scale: 3/10 Pain location: L shoulder Pain description: aching Aggravating factors: malpositioning Relieving factors: rest,  Foot pain 3/10, standing on if aggravates, rest alleviates   FALLS: Has patient fallen in last 6 months? Yes. Number of falls 1  LIVING ENVIRONMENT: Lives with:  lives with their family Lives in: House/apartment Stairs: Yes: External: 3 steps; on left going up Has following equipment at home: Single point cane, Walker - 2 wheeled, Wheelchair (manual), Shower bench, and bed side commode  PLOF: Independent with basic ADLs and Independent with household mobility with device  PATIENT GOALS: return to work, regain arm function  OBJECTIVE:   HAND  DOMINANCE: Right  ADLs: Overall ADLs: Modified Independent Transfers/ambulation related to ADLs: Eating: mod I Grooming: mod I UB Dressing: mod I LB Dressing: mod I Toileting: mod I Bathing: mod I Tub Shower transfers: has tub transfer bench, and toilet surround Equipment: Transfer tub bench and bed side commode  IADLs:  Light housekeeping: Is doing laundry  Meal Prep: Is not cooking Community mobility: Is not driving   POSTURE COMMENTS:  rounded shoulders, forward head, and posterior pelvic tilt   ACTIVITY TOLERANCE: Activity tolerance: Mild impairment   UPPER EXTREMITY ROM:    Active ROM Right eval Left eval  Shoulder flexion WFL AROM 80/ PROM110  Shoulder abduction  70  Shoulder adduction    Shoulder extension    Shoulder internal rotation    Shoulder external rotation    Elbow flexion  -25  Elbow extension  -10  Wrist flexion    Wrist extension  30  Wrist ulnar deviation    Wrist radial deviation    Wrist pronation    Wrist supination    (Blank rows = not tested)  UPPER EXTREMITY MMT:     MMT Right eval Left eval  Shoulder flexion 4+/5 NT  Shoulder abduction Thruout   Shoulder adduction    Shoulder extension    Shoulder internal rotation    Shoulder external rotation    Middle trapezius    Lower trapezius    Elbow flexion    Elbow extension    Wrist flexion    Wrist extension    Wrist ulnar deviation    Wrist radial deviation    Wrist pronation    Wrist supination    (Blank rows = not tested)  HAND FUNCTION: Grip strength: Right: 76 lbs; Left: 26 lbs   COORDINATION: 9 Hole Peg test: Right: 35 sec; Left: 63mn52  sec  SENSATION: Light touch: Impaired  Proprioception: Impaired     MUSCLE TONE: LUE: Moderate and Hypertonic  COGNITION: Overall cognitive status: Impaired  VISION: Subjective report: Reports having nystagmus initially Baseline vision: No visual deficits Visual history:  NA  VISION  ASSESSMENT: WFL    PERCEPTION: Impaired: Inattention/neglect: does not attend to left side of body  PRAXIS: WFL    TODAY'S TREATMENT:   12/17/22                                                                                                                           Pt stood with left foot toes propped on foam with walker in place to stretch his left foot due to tightness impacting mobility. Closed chain reaching for floor with  foam then bring to lap and performing chest press, min v.c for posture and positioning. Closed chain shoulder flexion with bilateral UE's, to just below eye level min v.c to avoid overusing RUE. Reviewed HEP, with updates see pt instructions  PATIENT EDUCATION: Education details: updated and consolidated HEP, see pt instructions Person educated: Patient Education method: Explanation, Demonstration, Verbal cues, and Handouts Education comprehension: verbalized understanding, returned demonstration, and verbal cues required   HOME EXERCISE PROGRAM: 11/03/22 coordination, supine closed chain   GOALS: Goals reviewed with patient? No  SHORT TERM GOALS: Target date:01/08/23  Patient will complete an HEP designed to improve range of motion in left shoulder  Goal status: MET  2.  Patient will demonstrate effectively low reach to obtain and replace a lightweight (less than 2lb) object from counter height in standing  Goal status:MET  3.  Patient will demonstrate at least 100 degrees of active shoulder flexion in supine as preparation for mid level reach  Goal status:met 115*  4.  Patient will report pain no greater than 2/10 with passive range of motion to 120 degrees of shoulder flexion  Goal status:  not met 105*  pain 3/10 or less     LONG TERM GOALS: Target date: 02/04/23  Patient will complete HEP designed to improve Left grip strength  Goal status:  met  2.  Patient will demonstrate at least 90* of active shoulder flexion in upright position  without lateral trunk flexion or excessive scapular elevation in preparation for mid level reach. Revised- Patient will demonstrate at least 95* of active shoulder flexion in upright position with not more than min compensation in prep for functional reach  Goal status:partially met, 95, with min-mod compensation, elbow flexion, goal updated   3.  Patient will demonstrate at least a 5 lb increase in left grip strength  Baseline: 26lb Goal status: 36.8 lbs 4.  Patient will demonstrate improved left coordination as evidenced by decreased time on 9 hole peg test to 1 min 30 sec or less. Revised goal-Pt will demonstrate improved fine motor coordination as evidenced by decreasing 9 hole peg test score to 1 min 24 secs Baseline: 32mn 52 sec Goal status: initial goal met 1 min 27 secs updated goal deferred as pt request d/c today. 5.  Patient will shower with modified independence  Goal status:  not met unable to access the bathroom well, currently sponge bathing, pt will   6.  Patient will prepare simple meal with modified independence  Goal status:deferred not fully addressed as pt prefers to work on his UE instead, pt is living with his mother and she is able to assist  ASSESSMENT:  CLINICAL IMPRESSION: Pt demonstrates overall progress towards goals however he has not fully achieved all goals as he has requested early d/c due to financial limitations. PERFORMANCE DEFICITS: in functional skills including ADLs, IADLs, coordination, dexterity, proprioception, sensation, tone, ROM, strength, pain, fascial restrictions, flexibility, Fine motor control, Gross motor control, mobility, balance, body mechanics, and UE functional use, cognitive skills including attention IMPAIRMENTS: are limiting patient from ADLs, IADLs, and work.   CO-MORBIDITIES: may have co-morbidities  that affects occupational performance. Patient will benefit from skilled OT to address above impairments and improve overall  function.  MODIFICATION OR ASSISTANCE TO COMPLETE EVALUATION: Min-Moderate modification of tasks or assist with assess necessary to complete an evaluation.  OT OCCUPATIONAL PROFILE AND HISTORY: Detailed assessment: Review of records and additional review of physical, cognitive, psychosocial history related to current functional performance.  CLINICAL  DECISION MAKING: Moderate - several treatment options, min-mod task modification necessary  REHAB POTENTIAL: Good  EVALUATION COMPLEXITY: Moderate    PLAN:  OT FREQUENCY: 2x/week  OT DURATION: 8 weeks  PLANNED INTERVENTIONS: self care/ADL training, therapeutic exercise, therapeutic activity, neuromuscular re-education, manual therapy, passive range of motion, balance training, functional mobility training, aquatic therapy, cognitive remediation/compensation, visual/perceptual remediation/compensation, and DME and/or AE instructions  RECOMMENDED OTHER SERVICES: NA  CONSULTED AND AGREED WITH PLAN OF CARE: Patient  PLAN FOR NEXT SESSION: d/c OT per pt request  Pricila Bridge, OT 12/17/2022, 1:07 PM

## 2022-12-17 NOTE — Patient Instructions (Addendum)
Position yourself close to items and make sure shoulder are upright when you go to reach for items  SCAPULA: Retraction    Hold cane with both hands. Pinch shoulder blades together then reach towards the floor bring paper towel roll back to your lap. Do not shrug shoulders. Hold _5__ seconds.  _10__ reps per set, _2__ sets per day, __7_ days per week   Copyright  [employer]. All rights reserved.     SHOULDER: Flexion - Sitting    Hold paper towel roll with both hands. Raise arms up. Keep elbows straight. Hold _5__ seconds. No weight  __10_ reps per set, _2__ sets per day, __7_ days per week  Copyright  VHI. All rights reserved.       ELBOW: Extension / Chest Press (Frame)    Lie on back with knees bent. Straighten elbows to push paper towel roll towards ceiling Hold __5_ seconds.  _10__ reps per set, _2__ sets per day, __7_ days per week Use steering wheel or hula hoop instead of frame.  Copyright  [employer]. All rights reserved.   Shoulder: Flexion (Supine)    With hands shoulder width apart, slowly lower paper towel roll  to just above eye level Do not let elbows bend. Keep back flat. Hold _5___ seconds. Repeat __10__ times. Do __2__ sessions per day. CAUTION: Stretch slowly and gently.  Copyright  VHI. All rights reserved.      Lateral Weight Shift: Upper Trunk Leading   Sit with feet flat on floor. Bring _left ___ shoulder, head and arm toward side until forearm/elbow just touches sitting surface. Hold __5-10__ seconds. Return to upright position by pushing through arm Repeat _10___ times per session. Do __2__ sessions per day.   Forward Upper Body Weight Shift   [email or web address]   SITTING: Forward Weight Shift   Sit upright, place both hands on sitting surface. Lean chest forward, keep back straight. Hold _5__ seconds. _10__ reps per set, _2__ sets per day.   Marland Kitchen

## 2022-12-18 ENCOUNTER — Ambulatory Visit: Payer: 59 | Attending: Cardiovascular Disease | Admitting: Cardiovascular Disease

## 2022-12-18 ENCOUNTER — Encounter: Payer: Self-pay | Admitting: Cardiovascular Disease

## 2022-12-18 VITALS — BP 98/58 | HR 68 | Ht 65.0 in | Wt 155.0 lb

## 2022-12-18 DIAGNOSIS — I5042 Chronic combined systolic (congestive) and diastolic (congestive) heart failure: Secondary | ICD-10-CM

## 2022-12-18 DIAGNOSIS — I255 Ischemic cardiomyopathy: Secondary | ICD-10-CM

## 2022-12-18 DIAGNOSIS — R600 Localized edema: Secondary | ICD-10-CM

## 2022-12-18 DIAGNOSIS — G4733 Obstructive sleep apnea (adult) (pediatric): Secondary | ICD-10-CM

## 2022-12-18 DIAGNOSIS — I361 Nonrheumatic tricuspid (valve) insufficiency: Secondary | ICD-10-CM

## 2022-12-18 DIAGNOSIS — I351 Nonrheumatic aortic (valve) insufficiency: Secondary | ICD-10-CM

## 2022-12-18 NOTE — Progress Notes (Signed)
Cardiology Office Note    Date:  12/22/2022   ID:  Christopher Morales, DOB 1971-08-03, MRN [id number]  PCP:  Leotis Pain, MD  Cardiologist:  Shelva Majestic, MD   4 month F/U sleep evaluation initially referred by Deatra Ina, MD at [facility] following initiation of CPAP therapy.    History of Present Illness:  Christopher Morales is a 52 y.o. male who is followed at [facility] internal medicine and rsaw Dr. Dagoberto Ligas, IV on July 02, 2022.  He has significant cardiac history and remotely had seen Dr. Ubaldo Glassing and now sees Dr.Paraschos at the [place] [facility] in [place].  He has documented chronic systolic heart failure and now presents for sleep evaluation in [place] rather than [place].  However, the patient has a history of prior MI and had been living in [place].  He has a history of severe LV dysfunction with heart failure in 2008 and apparently EF at that time was 20% which subsequently improved to greater than 55%.  He suffered a stroke on January 04, 2022 and subsequently had an MI complicated by ventricular tachycardia.  He underwent cardiac catheterization on January 27, 2022 which showed three-vessel obstructive disease with chronically occluded LAD, chronically occluded RCA, and 90% stenosis in the left circumflex vessel with collaterals to the distal LAD with EF 45 to 50%.  He was turned down for CABG revascularization due to a recent CVA and had undergone PCI on January 29, 2022 with DES stenting to his circumflex vessel.  He subsequently has had issues with acute on chronic heart failure.  He tells me his medications had changed after his stroke and he is no longer taking lisinopril or carvedilol but has been on amiodarone 200 mg daily, furosemide 20 mg which he takes 3 times per week, isosorbide 30 mg daily, and is on aspirin/ticagrelor following his stent.  He is on pantoprazole for GERD.  At my initial office visit, he was having issues  with sleep and was sleeping with supplemental oxygen  at 1 to 2 L.  He admitted to snoring, nocturia at least 2 times per night, awakening gasping for breath, as well as occasionally talks in his sleep.  He typically goes to bed between 11 PM and midnight and often wakes up at 9 AM.  He previously had had a sleep study at [facility] in April.  He apparently was told of having sleep apnea but had not yet seen a sleep physician and instead of being seen by the neurologist at [facility] he is now referred for evaluation.  An Epworth sleepiness score today calculated at 6 arguing against significant daytime sleepiness.  During my initial evaluation, I had a long discussion with him regarding sleep apnea and potential adverse cardiovascular consequences of left untreated.  I discussed effects on blood pressure, potential for nocturnal arrhythmias, increased risk for atrial fibrillation,, negative effects on glucose, GERD and inflammation as well as potential for nocturnal hypoxemia contributing to ischemia.  He underwent an echo Doppler study September 09, 2022 which showed low normal LV function with EF 50 to 55% with mild global hypokinesis.  His RV was mildly enlarged with moderate elevation of pulmonary artery systolic pressure.  There was biatrial enlargement, mild MR, mild to moderate TR, and moderately severe AR.  Results were sent to Dr. Saralyn Pilar his cardiologist in [place].  Since I saw him, he saw EP physician at [facility] in [place] Dr. Lovena Le  and is now off amiodarone.  Christopher Morales received a new ResMed air curve 10 via auto unit on October 21, 2022 with [employer] as his new DME company.  He has used therapy on a daily basis since January 12 with 100% use.  Average use on days used was 7 hours and 33 minutes.  His BiPAP is set at a minimum EPAP of 16, pressure support of 4, maximum IPAP of 25.  His 95th percentile pressure 21.6/17.6 with maximum average pressure 22.4/18.4.  AHI is 4.9.  He has noticed significant  benefit since initiating therapy with improved energy.  He is no longer snoring.  Previously he had nocturia 5 times per night and now this has been reduced to 1 time per evening.  He presents for evaluation.  Past Medical History:  Diagnosis Date   CHF (congestive heart failure) (HCC)    Hypertension    Renal disorder     No past surgical history on file.  Current Medications: Outpatient Medications Prior to Visit  Medication Sig Dispense Refill   acetaminophen (TYLENOL) 325 MG tablet Take by mouth.     acetaminophen (TYLENOL) 325 MG tablet Take 325 mg by mouth every 4 (four) hours as needed.     aspirin EC 81 MG tablet Take 81 mg by mouth daily.     atorvastatin (LIPITOR) 80 MG tablet Take 80 mg by mouth daily.     clonazePAM (KLONOPIN) 0.5 MG tablet Take 0.5 mg by mouth daily as needed.     cyanocobalamin (VITAMIN B12) 1000 MCG tablet Take 1,000 mcg by mouth daily.     diclofenac Sodium (VOLTAREN) 1 % GEL Apply 2 g topically as needed.     ENTRESTO 24-26 MG Take 1 tablet by mouth 2 (two) times daily. Patient cuts tablet in half twice daily     furosemide (LASIX) 20 MG tablet Take 20 mg by mouth daily.     isosorbide mononitrate (IMDUR) 30 MG 24 hr tablet Take 30 mg by mouth daily.     Lactobacillus Rhamnosus, GG, (CULTURELLE) CAPS Take 1 capsule by mouth daily.     metoprolol succinate (TOPROL-XL) 25 MG 24 hr tablet Take by mouth.     nystatin cream (MYCOSTATIN) Apply topically 2 (two) times daily.     pantoprazole (PROTONIX) 40 MG tablet Take by mouth.     pantoprazole (PROTONIX) 40 MG tablet Take 1 tablet by mouth daily.     tamsulosin (FLOMAX) 0.4 MG CAPS capsule Take 0.4 mg by mouth daily.     ticagrelor (BRILINTA) 90 MG TABS tablet Take 1 tablet by mouth 2 (two) times daily.     triamcinolone cream (KENALOG) 0.1 % Apply topically.     amiodarone (PACERONE) 200 MG tablet Take 200 mg by mouth daily. (Patient not taking: Reported on 12/18/2022)     clonazePAM (KLONOPIN) 0.5 MG  tablet Take by mouth.     ferrous sulfate 325 (65 FE) MG tablet Take 325 mg by mouth once a week. (Patient not taking: Reported on 12/18/2022)     nitroGLYCERIN (NITROSTAT) 0.4 MG SL tablet Place under the tongue. (Patient not taking: Reported on 12/18/2022)     No facility-administered medications prior to visit.     Allergies:   Patient has no known allergies.   Social History   Socioeconomic History   Marital status: Single    Spouse name: Not on file   Number of children: Not on file   Years of education: Not on file  Highest education level: Not on file  Occupational History   Not on file  Tobacco Use   Smoking status: Never   Smokeless tobacco: Never  Substance and Sexual Activity   Alcohol use: Never   Drug use: Never   Sexual activity: Never  Other Topics Concern   Not on file  Social History Narrative   Not on file   Social Determinants of Health   Financial Resource Strain: Not on file  Food Insecurity: Not on file  Transportation Needs: Not on file  Physical Activity: Not on file  Stress: Not on file  Social Connections: Not on file      Family History:    ROS General: Negative; No fevers, chills, or night sweats;  HEENT: Negative; No changes in vision or hearing, sinus congestion, difficulty swallowing Pulmonary: Negative; No cough, wheezing, shortness of breath, hemoptysis Cardiovascular: See HPI GI: Negative; No nausea, vomiting, diarrhea, or abdominal pain GU: Negative; No dysuria, hematuria, or difficulty voiding Musculoskeletal: Negative; no myalgias, joint pain, or weakness Hematologic/Oncology: Negative; no easy bruising, bleeding Endocrine: Negative; no heat/cold intolerance; no diabetes Neuro: Negative; no changes in balance, headaches Skin: Negative; No rashes or skin lesions Psychiatric: Negative; No behavioral problems, depression Sleep: Positive for snoring snoring, apparently recently diagnosed sleep apnea; nodaytime sleepiness,  hypersomnolence, bruxism, restless legs, hypnogognic hallucinations, no cataplexy Other comprehensive 14 point system review is negative.   PHYSICAL EXAM:   VS:  BP (!) 98/58 (BP Location: Left Arm, Patient Position: Sitting, Cuff Size: Normal)   Pulse 68   Ht '5\' 5"'$  (1.651 m)   Wt 155 lb (70.3 kg)   BMI 25.79 kg/m     Repeat blood pressure by me was low at 92/60.  Wt Readings from Last 3 Encounters:  12/22/22 155 lb (70.3 kg)  12/18/22 155 lb (70.3 kg)  09/24/22 157 lb (71.2 kg)    General: Alert, oriented, no distress.  Skin: normal turgor, no rashes, warm and dry HEENT: Normocephalic, atraumatic. Pupils equal round and reactive to light; sclera anicteric; extraocular muscles intact;  Nose without nasal septal hypertrophy Mouth/Parynx benign; Mallinpatti scale 4 Neck: No JVD, no carotid bruits; normal carotid upstroke Lungs: clear to ausculatation and percussion; no wheezing or rales Chest wall: without tenderness to palpitation Heart: PMI not displaced, RRR, s1 s2 normal, 1/6 systolic murmur, no diastolic murmur, no rubs, gallops, thrills, or heaves Abdomen: soft, nontender; no hepatosplenomehaly, BS+; abdominal aorta nontender and not dilated by palpation. Back: no CVA tenderness Pulses 2+ Musculoskeletal: full range of motion, normal strength, no joint deformities Extremities: Trace ankle edema, no clubbing cyanosis, Homan's sign negative  Neurologic: grossly nonfocal; Cranial nerves grossly wnl Psychologic: Normal mood and affect   Studies/Labs Reviewed:   December 18, 2022 ECG (independently read by me): Sinus rhythm at 68, 1st degree AV block, Nonspecific T wave abnormality  August 15, 2022 ECG (independently read by me):  Sinus rhythm at 75, 1st degree AV block, LAHB, Increased QTc at 491 msec.  Recent Labs:    Latest Ref Rng & Units 04/25/2020    4:35 AM 04/25/2020   12:39 AM  BMP  Glucose 70 - 99 mg/dL 80  111   BUN 6 - 20 mg/dL 28  38   Creatinine 0.61 -  1.24 mg/dL 1.49  2.56   Sodium 135 - 145 mmol/L 139  137   Potassium 3.5 - 5.1 mmol/L 3.6  3.9   Chloride 98 - 111 mmol/L 115  103   CO2 22 -  32 mmol/L 15  26   Calcium 8.9 - 10.3 mg/dL 6.5  8.8          No data to display             Latest Ref Rng & Units 04/25/2020   12:39 AM  CBC  WBC 4.0 - 10.5 K/uL 10.8   Hemoglobin 13.0 - 17.0 g/dL 13.4   Hematocrit 39.0 - 52.0 % 39.0   Platelets 150 - 400 K/uL 212    Lab Results  Component Value Date   MCV 89.7 04/25/2020   No results found for: "TSH" No results found for: "HGBA1C"   BNP No results found for: "BNP"  ProBNP No results found for: "PROBNP"   Lipid Panel  No results found for: "CHOL", "TRIG", "HDL", "CHOLHDL", "VLDL", "LDLCALC", "LDLDIRECT", "LABVLDL"   RADIOLOGY: XR Ankle Complete Left  Result Date: 12/22/2022 Radiographs of his left ankle were obtained today.  No acute fractures are noted he has to have positive Haglund's deformity and posterior spurring consistent with Achilles insertional tendinitis and tightness.  Also plantar spur consistent with previous history of plantar fasciitis.    Additional studies/ records that were reviewed today include:   I have reviewed the records of [facility].  I also reviewed the records of Dr. Miquel Dunn   ASSESSMENT:    1. OSA (obstructive sleep apnea) on BiPAP   2. Chronic combined systolic and diastolic heart failure ([place])   3. Ischemic cardiomyopathy   4. Nonrheumatic aortic valve insufficiency   5. Nonrheumatic tricuspid valve regurgitation   6. Bilateral lower extremity edema     PLAN:  Christopher Morales is a 52 year old gentleman who has a history of CAD, myocardial infarction, VT, CVA,  LV dysfunction, and acute on chronic systolic and diastolic heart failure.  He apparently underwent a sleep study earlier this year; I do not have any records.  When I saw him for my initial evaluation he admitted to  snoring, nocturia  at least 3 times per night, and has awaken gasping for breath particularly when sleeping on his back.  He typically goes to bed between 11 PM and midnight and wakes up at 9 AM.  His sleep is not restorative.  He is unaware of any bruxism, restless legs, hypnopompic or hypnagogic hallucinations or cataplectic events.  When I saw him for his initial evaluation I had a lengthy discussion with him regarding sleep apnea and if left untreated potential adverse cardiovascular consequences.  I was also concerned with significant cardiac murmurs and a 2D echo Doppler study confirmed my suspicion with low normal LV function with EF 50 to 55% and mild global hypokinesis.  He had moderate elevation of pulmonary artery systolic pressure.  There was mild MR, mild to moderate TR, and moderately severe AR.  I recommended he follow-up with Dr. Sharlett Iles who is his cardiologist.  He also was recently seen electrophysiology and his amiodarone was discontinued by Dr. Lovena Le.  With reference to his sleep apnea, Christopher Morales received a new ResMed air curve 10 via auto BiPAP unit on October 21, 2022.  However, he did not initiate therapy until 1 day on January 8 and then he has been using treatment on a daily basis since November 07, 2022.  Since initiating daily therapy, he has noticed significant benefit.  He has more energy.  He denies residual fatigability.  He is no longer snoring.  Previous significant nocturia at times up to 5 times per  night and is now once per evening.  His most recent download shows high pressure requirement with his 95th percentile pressure of 21.6/17.6 and maximum average pressure 22.4/18.4.  His current pressure set up is a minimum EPAP of 16, pressure support of 4 and maximum IPAP of 25.  He has been using a fullface mask.  An Epworth scale was recalculated in the office today and since BiPAP initiation there is no residual daytime sleepiness with his Epworth scale calculated at 1 with only slight chance of  dozing while lying down to rest in the afternoon when circumstances persist.  He was just started on Entresto at low-dose by Dr. Laverle Patter show.  He is taking furosemide on an as-needed basis.  He is not having any anginal symptoms on isosorbide 30 mg metoprolol succinate 25 mg.  He continues to be on DAPT with aspirin/ticagrelor.  Of note, recent laboratory did show elevation PTH which we will follow-up.  He will return to his primary care as well as Dr. Neldon Newport show for follow-up evaluations.  From a sleep perspective I will see him in 1 year for follow-up evaluation or sooner as needed.    Medication Adjustments/Labs and Tests Ordered: Current medicines are reviewed at length with the patient today.  Concerns regarding medicines are outlined above.  Medication changes, Labs and Tests ordered today are listed in the Patient Instructions below. Patient Instructions  Medication Instructions:   Contact Dr Saralyn Pilar concerning your dose of Entresto - today's blood pressure 98/58  *If you need a refill on your cardiac medications before your next appointment, please call your pharmacy*   Lab Work: No t needed    Testing/Procedures:  not needed   Follow-Up: At [facility], you and your health needs are our priority.  As part of our continuing mission to provide you with exceptional heart care, we have created designated Provider Care Teams.  These Care Teams include your primary Cardiologist (physician) and Advanced Practice Providers (APPs -  Physician Assistants and Nurse Practitioners) who all work together to provide you with the care you need, when you need it.     Your next appointment:   12 month(s) sleep  only   The format for your next appointment:   In Person  Provider:   Dr Shelva Majestic     Signed, Shelva Majestic, [place], ABSM American Board of Sleep Medicine  12/22/2022 4:45 PM    [place] [address], [place], [place], [place]   [address] Phone: [phone]

## 2022-12-18 NOTE — Patient Instructions (Addendum)
Medication Instructions:   Contact Dr Saralyn Pilar concerning your dose of Entresto - today's blood pressure 98/58  *If you need a refill on your cardiac medications before your next appointment, please call your pharmacy*   Lab Work: No t needed    Testing/Procedures:  not needed   Follow-Up: At [facility], you and your health needs are our priority.  As part of our continuing mission to provide you with exceptional heart care, we have created designated Provider Care Teams.  These Care Teams include your primary Cardiologist (physician) and Advanced Practice Providers (APPs -  Physician Assistants and Nurse Practitioners) who all work together to provide you with the care you need, when you need it.     Your next appointment:   12 month(s) sleep  only   The format for your next appointment:   In Person  Provider:   Dr Shelva Majestic

## 2022-12-22 ENCOUNTER — Encounter: Payer: Self-pay | Admitting: Physician Assistant

## 2022-12-22 ENCOUNTER — Ambulatory Visit (INDEPENDENT_AMBULATORY_CARE_PROVIDER_SITE_OTHER): Payer: 59

## 2022-12-22 ENCOUNTER — Ambulatory Visit: Payer: 59 | Admitting: Physical Therapy

## 2022-12-22 ENCOUNTER — Ambulatory Visit (INDEPENDENT_AMBULATORY_CARE_PROVIDER_SITE_OTHER): Payer: 59 | Admitting: Physician Assistant

## 2022-12-22 ENCOUNTER — Encounter: Payer: 59 | Admitting: Occupational Therapy

## 2022-12-22 ENCOUNTER — Encounter: Payer: Self-pay | Admitting: Cardiovascular Disease

## 2022-12-22 VITALS — Ht 65.0 in | Wt 155.0 lb

## 2022-12-22 DIAGNOSIS — M25572 Pain in left ankle and joints of left foot: Secondary | ICD-10-CM | POA: Diagnosis not present

## 2022-12-22 NOTE — Progress Notes (Signed)
Office Visit Note   Patient: Christopher Morales           Date of Birth: Feb 16, 1971           MRN: [id number] Visit Date: 12/22/2022              Requested by: Christopher Morales, [place] [place],  [place] [id number] PCP: Christopher Morales  Chief Complaint  Patient presents with   Left Ankle - Morales      HPI: Christopher Morales is a pleasant 52 year old gentleman with a chief complaint of left posterior heel Morales.  In the last year the patient has had heart failure MI due to myocardial infarction with placement of his stent.  He is also had a stroke which is 6 affected the left side of his body.  He has been in physical therapy with neurorehab but his biggest complaint is Morales in the back of his heel.  He said this makes it somewhat painful to walk.  This is getting worse.  He is especially a problem when he first gets up in the morning.  His therapist just started giving him some stretching exercises.  He did say when he had plantar fasciitis he got inserts from the [employer] which helped him significantly.  Because he has been dealing with some swelling he has not been able to wear regular shoes.  He thinks this may be part of the problem.  Physical therapy recently placed a small lift in his left heel which he thinks helps a little bit  Assessment & Plan: Visit Diagnoses:  1. 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  Had a long discussion with the patient about this.  He does have the ability to evert and invert but has significant equinus and cannot quite come to neutral.  With some stretching this does not prove.  I do not think this is a fixed deformity yet.  Does not have a foot drop he does have active dorsiflexion but is seems to be limited by motion.  I have recommended he would like to go back to the [employer] and because he has had good luck there I  encouraged him to do this.  He would also do well when he can to wear more stiff supportive shoe something with an arch support built-in.  I would like for the neurorehab to work aggressively on stretching with him of his Achilles to hopefully bring his foot to a better position.  Would like him to follow-up in 1 month with Christopher Morales  Follow-Up Instructions: Return in about 4 months (around 04/22/2023) for wtih Christopher Morales for achilles tendonitis.   Ortho Exam  Patient is alert, oriented, no adenopathy, well-dressed, normal affect, normal respiratory effort. Examination of his left foot he has a palpable dorsalis pedis pulse.  He is able to actively dorsiflex but is limited by motion.  He has moderately weak eversion and good inversion and plantarflexion.  Sensation is intact.  He is able to hold his ankle up actively.  Does have tenderness in the posterior heel at the insertion of the Achilles no redness no skin breakdown mild soft tissue swelling but does have good wrinkling of the skin  Imaging: XR Ankle Complete Left  Result Date: 12/22/2022 Radiographs of his left ankle were obtained today.  No acute  fractures are noted he has to have positive Haglund's deformity and posterior spurring consistent with Achilles insertional tendinitis and tightness.  Also plantar spur consistent with previous history of plantar fasciitis.  No images are attached to the encounter.  Labs: No results found for: "HGBA1C", "ESRSEDRATE", "CRP", "LABURIC", "REPTSTATUS", "GRAMSTAIN", "CULT", "LABORGA"   No results found for: "ALBUMIN", "PREALBUMIN", "CBC"  No results found for: "MG" No results found for: "VD25OH"  No results found for: "PREALBUMIN"    Latest Ref Rng & Units 04/25/2020   12:39 AM  CBC EXTENDED  WBC 4.0 - 10.5 K/uL 10.8   RBC 4.22 - 5.81 MIL/uL 4.35   Hemoglobin 13.0 - 17.0 g/dL 13.4   HCT 39.0 - 52.0 % 39.0   Platelets 150 - 400 K/uL 212   NEUT# 1.7 - 7.7 K/uL 7.2   Lymph# 0.7 - 4.0 K/uL 2.3       Body mass index is 25.79 kg/m.  Orders:  Orders Placed This Encounter  Procedures   XR Ankle Complete Left   Ambulatory referral to Physical Therapy   No orders of the defined types were placed in this encounter.    Procedures: No procedures performed  Clinical Data: No additional findings.  ROS:  All other systems negative, except as noted in the HPI. Review of Systems  Objective: Vital Signs: Ht '5\' 5"'$  (1.651 m)   Wt 155 lb (70.3 kg)   BMI 25.79 kg/m   Specialty Comments:  No specialty comments available.  PMFS History: Patient Active Problem List   Diagnosis Date Noted   OSA (obstructive sleep apnea) 09/24/2022   Chronic combined systolic and diastolic congestive heart failure ([place]) 09/24/2022   Nocturnal hypoxia 09/24/2022   Past Medical History:  Diagnosis Date   CHF (congestive heart failure) (HCC)    Hypertension    Renal disorder     History reviewed. No pertinent family history.  History reviewed. No pertinent surgical history. Social History   Occupational History   Not on file  Tobacco Use   Smoking status: Never   Smokeless tobacco: Never  Substance and Sexual Activity   Alcohol use: Never   Drug use: Never   Sexual activity: Never

## 2022-12-24 ENCOUNTER — Encounter: Payer: 59 | Admitting: Occupational Therapy

## 2022-12-29 ENCOUNTER — Encounter: Payer: 59 | Admitting: Occupational Therapy

## 2022-12-29 ENCOUNTER — Ambulatory Visit: Payer: 59 | Attending: Internal Medicine | Admitting: Physical Therapy

## 2022-12-29 DIAGNOSIS — R2681 Unsteadiness on feet: Secondary | ICD-10-CM

## 2022-12-29 DIAGNOSIS — R2689 Other abnormalities of gait and mobility: Secondary | ICD-10-CM

## 2022-12-29 DIAGNOSIS — M6281 Muscle weakness (generalized): Secondary | ICD-10-CM

## 2022-12-29 DIAGNOSIS — R208 Other disturbances of skin sensation: Secondary | ICD-10-CM

## 2022-12-29 NOTE — Therapy (Signed)
OUTPATIENT PHYSICAL THERAPY NEURO TREATMENT   Patient Name: Christopher Morales MRN: [id number] DOB:[birth year], 52 y.o., male Today's Date: 12/29/2022   PCP: Leotis Pain, MD REFERRING PROVIDER: Leotis Pain, MD     END OF SESSION:  PT End of Session - 12/29/22 1450     Visit Number 18    Number of Visits 28   recert   Date for PT Re-Evaluation [id number]   recert   Authorization Type [employer]    Authorization Time Period 30 visit limit PT/OT    Authorization - Number of Visits 15   30 visit limit PT/OT   Progress Note Due on Visit 10    PT Start Time 1450    PT Stop Time 1528    PT Time Calculation (min) 38 min    Equipment Utilized During Treatment Gait belt    Activity Tolerance Patient tolerated treatment well    Behavior During Therapy WFL for tasks assessed/performed                Past Medical History:  Diagnosis Date   CHF (congestive heart failure) ([place])    Hypertension    Renal disorder    No past surgical history on file. Patient Active Problem List   Diagnosis Date Noted   OSA (obstructive sleep apnea) 09/24/2022   Chronic combined systolic and diastolic congestive heart failure ([place]) 09/24/2022   Nocturnal hypoxia 09/24/2022    ONSET DATE: 09/10/2022  REFERRING DIAG: M25.519 (ICD-10-CM) - Pain in unspecified shoulder G89.29 (ICD-10-CM) - Chronic pain R26.89 (ICD-10-CM) - Other abnormalities of gait and mobility R29.898 (ICD-10-CM) - Other symptoms and signs involving the musculoskeletal system Z86.73 (ICD-10-CM) - History of stroke  THERAPY DIAG:  Muscle weakness (generalized)  Unsteadiness on feet  Other abnormalities of gait and mobility  Other disturbances of skin sensation  Rationale for Evaluation and Treatment: Rehabilitation  SUBJECTIVE:                                                                                                                                                                                             SUBJECTIVE  STATEMENT: Pt wearing different shoes to his appointment today, reports he feels like they help him to walk faster. Pt also has insoles in these shoes he got years ago at the [employer], doesn't have his LLE heel lift. Pt has been using ice and trying to stretch his L heel and that has helped with his pain.  Pt accompanied by: self  PERTINENT HISTORY: chronic systolic heart failure, hx of MI, heart failure, cardiac cath April 2023 and stent, HTN, renal disorder,  CVA March 2023  PAIN:  Are you having pain? No  PRECAUTIONS: Fall  WEIGHT BEARING RESTRICTIONS: No  OBJECTIVE:    TODAY'S TREATMENT:           THER EX: Standing gastroc stretch at wall 3 x 30 sec each on LLE Seated heel cord stretch with gait belt 3 x 30 sec each on LLE  Added to HEP, see bolded below  Reassessed B hip mobility in supine position on mat table. Pt exhibits some tightness in his L hip flexors but appears to be improved compared to last assessment. Pt does exhibit more tightness in his R hip as compared to his left hip. Reviewed SKTC stretch that pt already has as part of his HEP and encouraged him to continue performing this stretch and to hold his LE at the knee to stretch his quads as well.  GAIT: Added L heel wedge to pt's shoes due to ongoing L knee hyperextension. Pt exhibits some improvement in knee control with use of wedge.  Gait pattern: decreased hip/knee flexion- Left Distance walked: 230 ft Assistive device utilized: Quad cane small base "hurricane" Level of assistance: SBA Comments: trial gait with hurricane with new shoes and L heel wedge; pt does exhibit onset of decreased L hip and knee flexion with onset of fatigue with decreased eccentric control of limb; encouraged pt to keep practicing gait at home indoors with his hurricane   PATIENT EDUCATION: Education details: continue HEP and added to/adjusted HEP, continue to monitor L heel pain, PT POC Person educated: Patient Education  method: Explanation, Demonstration, and Handouts Education comprehension: verbalized understanding, returned demonstration, and needs further education  HOME EXERCISE PROGRAM: Access Code: [facility] URL: [email or web address] Date: 09/22/2022 Prepared by: Elease Etienne  Exercises - Sit to Stand  - 1 x daily - 7 x weekly - 3 sets - 5 reps - Mini Squat with Counter Support  - 1 x daily - 7 x weekly - 3 sets - 10 reps - Heel Raises with Counter Support  - 1 x daily - 7 x weekly - 3 sets - 10 reps - Supine Bridge  - 1 x daily - 7 x weekly - 3 sets - 10 reps - Supine Single Bent Knee Fallout  - 1 x daily - 7 x weekly - 3 sets - 10 reps - Supine Single Knee to Chest Stretch  - 1 x daily - 7 x weekly - 2 sets - 5 reps - 30-60 sec hold - Supine Piriformis Stretch with Leg Straight  - 1 x daily - 7 x weekly - 2 sets - 5 reps - 30-60 sec hold - Seated Hamstring Stretch with Strap  - 1 x daily - 7 x weekly - 2 sets - 5 reps - 30-60 sec hold - Modified Thomas Stretch  - 1 x daily - 7 x weekly - 3 sets - 30-45 second hold - Alternating Step Taps with Counter Support  - 1 x daily - 7 x weekly - 3 sets - 10 reps - Staggered Sit-to-Stand  - 1 x daily - 7 x weekly - 3 sets - 10 reps - Ankle Inversion with Resistance  - 1 x daily - 7 x weekly - 3 sets - 10 reps   GOALS: Goals reviewed with patient? Yes   NEW SHORT TERM GOALS:   Target date: 12/08/2022   1.  Pt will improve gait velocity to at least 2.75 ft/sec for improved gait efficiency and performance at mod I level  Baseline: 1.79 ft/sec (11/20); 2.62 ft/sec modI w/ RW (12/11), 2.49 ft/sec mod I with RW (1/15), 2.89 ft/sec mod I RW (2/12) Goal status: MET  2.  Pt will improve normal TUG to less than or equal to 15 seconds for improved functional mobility and decreased fall risk. Baseline: 21.19 sec with RW (11/20), 16.37 sec with RW (1/15), 16.1 sec with RW (2/12) Goal status: IN PROGRESS  3.  Pt will ambulate >600 feet on  6MWT to demonstrate improved functional endurance for home and community participation. Baseline: 465' w/ RW (11/27), 508' w/ RW (12/11), 29' with RW (1/11), 585 ft with RW (1/15) Goal status: IN PROGRESS  4.  Pt will improve Berg score to 44/56 for decreased fall risk Baseline: 38/56 (11/27), 40/56 (12/11), 42/56 (1/15), 43/56 (2/12) Goal status: IN PROGRESS   NEW LONG TERM GOALS:  Target date: 01/05/2023  Pt will be independent with final HEP for improved strength, balance, transfers and gait. Baseline:  Goal status: INITIAL  2.  Pt will ambulate up and down 12 stairs w/ reciprocal gait at mod I level using rail and cane as needed to improve safe access to home and community environments. Baseline: 4 stairs step to w/ BUE on left rail Goal status: INITIAL   ASSESSMENT:  CLINICAL IMPRESSION: Emphasis of skilled PT session on continuing to assess L heel pain and work on ROM and strengthening exercises to address pain as well as continue to work on LLE strengthening. Pt with improved ability to feel stretch in L heel cord this session as compared to previous session. Also reassessed gait with hurricane vs RW with pt exhibiting onset of LLE weakness leading to above-mentioned gait deviation with onset of fatigue. Pt continues to benefit from skilled therapy services to address ongoing L hemibody weakness and gait impairments leading to decreased balance and increased fall risk. Continue POC.    OBJECTIVE IMPAIRMENTS: Abnormal gait, cardiopulmonary status limiting activity, decreased activity tolerance, decreased balance, decreased endurance, decreased strength, increased edema, impaired perceived functional ability, and impaired sensation.   ACTIVITY LIMITATIONS: carrying, lifting, bending, squatting, stairs, and reach over head  PARTICIPATION LIMITATIONS: driving, community activity, occupation, and church  PERSONAL FACTORS: chronic systolic heart failure, hx of MI, heart failure,  cardiac cath April 2023 and stent, HTN, renal disorder, CVA March 2023 are also affecting patient's functional outcome.   REHAB POTENTIAL: Good  CLINICAL DECISION MAKING: Stable/uncomplicated  EVALUATION COMPLEXITY: Moderate  PLAN:  PT FREQUENCY: 1-2x/week  PT DURATION: 6 weeks+ 15 visits (recert)  PLANNED INTERVENTIONS: Therapeutic exercises, Therapeutic activity, Neuromuscular re-education, Balance training, Gait training, Patient/Family education, Self Care, Joint mobilization, Stair training, Vestibular training, Canalith repositioning, Visual/preceptual remediation/compensation, Orthotic/Fit training, DME instructions, Aquatic Therapy, Dry Needling, Electrical stimulation, Cryotherapy, Moist heat, Taping, Manual therapy, and Re-evaluation  PLAN FOR NEXT SESSION: plan to d/c next session, also try gait outdoors across uneven surface with his hurricane to see if he would be safe to ambulate up/down his driveway, how is HEP and L heel pain?  Excell Seltzer, PT, DPT, CSRS 12/29/2022, 3:30 PM

## 2023-01-12 ENCOUNTER — Ambulatory Visit: Payer: 59 | Admitting: Physical Therapy

## 2023-01-12 DIAGNOSIS — M6281 Muscle weakness (generalized): Secondary | ICD-10-CM | POA: Diagnosis not present

## 2023-01-12 DIAGNOSIS — R2681 Unsteadiness on feet: Secondary | ICD-10-CM

## 2023-01-12 DIAGNOSIS — R208 Other disturbances of skin sensation: Secondary | ICD-10-CM

## 2023-01-12 DIAGNOSIS — R2689 Other abnormalities of gait and mobility: Secondary | ICD-10-CM

## 2023-01-12 NOTE — Therapy (Signed)
OUTPATIENT PHYSICAL THERAPY NEURO TREATMENT-DISCHARGE NOTE   Patient Name: Christopher Morales MRN: [id number] DOB:1971/06/06, 52 y.o., male Today's Date: 01/12/2023   PCP: Leotis Pain, MD REFERRING PROVIDER: Leotis Pain, MD  PHYSICAL THERAPY DISCHARGE SUMMARY  Visits from Start of Care: 56  Current functional level related to goals / functional outcomes: Mod I   Remaining deficits: Decreased LLE strength, impaired balance   Education / Equipment: Handout for HEP   Patient agrees to discharge. Patient goals were partially met. Patient is being discharged due to financial reasons.(Insurance visit limit)      END OF SESSION:  PT End of Session - 01/12/23 1447     Visit Number 19    Number of Visits 28   recert   Date for PT Re-Evaluation [id number]   recert   Authorization Type [employer]    Authorization Time Period 30 visit limit PT/OT    Authorization - Number of Visits 15   30 visit limit PT/OT   Progress Note Due on Visit 10    PT Start Time 1445    PT Stop Time 1537    PT Time Calculation (min) 52 min    Equipment Utilized During Treatment Gait belt    Activity Tolerance Patient tolerated treatment well    Behavior During Therapy WFL for tasks assessed/performed                 Past Medical History:  Diagnosis Date   CHF (congestive heart failure) ([place])    Hypertension    Renal disorder    No past surgical history on file. Patient Active Problem List   Diagnosis Date Noted   OSA (obstructive sleep apnea) 09/24/2022   Chronic combined systolic and diastolic congestive heart failure ([place]) 09/24/2022   Nocturnal hypoxia 09/24/2022    ONSET DATE: 09/10/2022  REFERRING DIAG: M25.519 (ICD-10-CM) - Pain in unspecified shoulder G89.29 (ICD-10-CM) - Chronic pain R26.89 (ICD-10-CM) - Other abnormalities of gait and mobility R29.898 (ICD-10-CM) - Other symptoms and signs involving the musculoskeletal system Z86.73 (ICD-10-CM) - History of stroke  THERAPY  DIAG:  Muscle weakness (generalized)  Unsteadiness on feet  Other abnormalities of gait and mobility  Other disturbances of skin sensation  Rationale for Evaluation and Treatment: Rehabilitation  SUBJECTIVE:                                                                                                                                                                                             SUBJECTIVE STATEMENT: Pt reports that overall his L heel/foot pain has been better. Pt reports that he continues to do his stretches,  puts ice on it, and continues to wear his new shoes. Pt also reports that he has tried walking more with his cane around the house, LLE still "stiff" with this.  Pt accompanied by: self  PERTINENT HISTORY: chronic systolic heart failure, hx of MI, heart failure, cardiac cath April 2023 and stent, HTN, renal disorder, CVA March 2023  PAIN:  Are you having pain? No  PRECAUTIONS: Fall  WEIGHT BEARING RESTRICTIONS: No  OBJECTIVE:    TODAY'S TREATMENT:           THER ACT: Static standing balance at countertop with no UE support: Romberg stance (easy) L/R modified tandem stance (challenging with LLE back/RLE forwards) Tandem gait (too challenging to perform safely)  Added L modified tandem stance and Romberg stance with EC to HEP, see bolded below   GAIT: Gait pattern: decreased hip/knee flexion- Left Distance walked: 300 ft Assistive device utilized: Quad cane small base "hurricane" Level of assistance: CGA Comments: outdoors up/down incline and uneven ground of sidewalk, decreased speed, decreased L hip and knee flexion; several standing rest breaks   STAIRS:  Level of Assistance: CGA  Stair Negotiation Technique: Step to Pattern with Single Rail on Right  Number of Stairs: 12   Height of Stairs: 6  Comments: with R handrail and hurricane   CURB:  Level of Assistance: CGA and Min A Assistive device utilized: Gaffer -  2 wheeled Curb Comments: min A with hurricane and decreased eccentric control when descending, CGA with RW and much safer/more controlled    PATIENT EDUCATION: Education details: continue HEP and added to/adjusted HEP, continue to work on gait with hurricane Person educated: Patient Education method: Consulting civil engineer, Demonstration, and Handouts Education comprehension: verbalized understanding and returned demonstration  HOME EXERCISE PROGRAM: Access Code: [facility] URL: [email or web address] Date: 09/22/2022 Prepared by: Elease Etienne  Exercises - Sit to Stand  - 1 x daily - 7 x weekly - 3 sets - 5 reps - Mini Squat with Counter Support  - 1 x daily - 7 x weekly - 3 sets - 10 reps - Heel Raises with Counter Support  - 1 x daily - 7 x weekly - 3 sets - 10 reps - Supine Bridge  - 1 x daily - 7 x weekly - 3 sets - 10 reps - Supine Single Bent Knee Fallout  - 1 x daily - 7 x weekly - 3 sets - 10 reps - Supine Single Knee to Chest Stretch  - 1 x daily - 7 x weekly - 2 sets - 5 reps - 30-60 sec hold - Supine Piriformis Stretch with Leg Straight  - 1 x daily - 7 x weekly - 2 sets - 5 reps - 30-60 sec hold - Seated Hamstring Stretch with Strap  - 1 x daily - 7 x weekly - 2 sets - 5 reps - 30-60 sec hold - Modified Thomas Stretch  - 1 x daily - 7 x weekly - 3 sets - 30-45 second hold - Alternating Step Taps with Counter Support  - 1 x daily - 7 x weekly - 3 sets - 10 reps - Staggered Sit-to-Stand  - 1 x daily - 7 x weekly - 3 sets - 10 reps - Ankle Inversion with Resistance  - 1 x daily - 7 x weekly - 3 sets - 10 reps - Wide Tandem Stance with Eyes Open  - 1 x daily - 7 x weekly - 1 sets - 5 reps - 30  sec hold - Romberg Stance with Eyes Closed  - 1 x daily - 7 x weekly - 1 sets - 5 reps - 30 sec hold    GOALS: Goals reviewed with patient? Yes   NEW SHORT TERM GOALS:   Target date: 12/08/2022   1.  Pt will improve gait velocity to at least 2.75 ft/sec for improved gait  efficiency and performance at mod I level  Baseline: 1.79 ft/sec (11/20); 2.62 ft/sec modI w/ RW (12/11), 2.49 ft/sec mod I with RW (1/15), 2.89 ft/sec mod I RW (2/12) Goal status: MET  2.  Pt will improve normal TUG to less than or equal to 15 seconds for improved functional mobility and decreased fall risk. Baseline: 21.19 sec with RW (11/20), 16.37 sec with RW (1/15), 16.1 sec with RW (2/12) Goal status: IN PROGRESS  3.  Pt will ambulate >600 feet on 6MWT to demonstrate improved functional endurance for home and community participation. Baseline: 465' w/ RW (11/27), 508' w/ RW (12/11), 72' with RW (1/11), 585 ft with RW (1/15) Goal status: IN PROGRESS  4.  Pt will improve Berg score to 44/56 for decreased fall risk Baseline: 38/56 (11/27), 40/56 (12/11), 42/56 (1/15), 43/56 (2/12) Goal status: IN PROGRESS   NEW LONG TERM GOALS:  Target date: 01/05/2023  Pt will be independent with final HEP for improved strength, balance, transfers and gait. Baseline:  Goal status: MET  2.  Pt will ambulate up and down 12 stairs w/ reciprocal gait at mod I level using rail and cane as needed to improve safe access to home and community environments. Baseline: 4 stairs step to w/ BUE on left rail, 12 stairs with R handrail and hurricane with CGA Goal status: NOT MET   ASSESSMENT:  CLINICAL IMPRESSION: Emphasis of skilled PT session on assessing LTG in preparation for d/c from PT services this date, adding balance exercises, to HEP, and practicing gait outdoors across uneven ground with "hurricane" as well as practicing navigating stairs and curbs with LRAD. Pt continues to exhibit decreased LLE strength leading to gait and balance impairments as noted above. Pt has met 1/2 LTG due to being independent with his final HEP. He does continue to require assist to safely navigate stairs and curbs with LRAD. Pt would benefit from continued skilled therapy services but is limited by his visit limit set per  his insurance. Pt to work on his HEP and gait with his hurricane at home and return to therapy in a few months if needed.   OBJECTIVE IMPAIRMENTS: Abnormal gait, cardiopulmonary status limiting activity, decreased activity tolerance, decreased balance, decreased endurance, decreased strength, increased edema, impaired perceived functional ability, and impaired sensation.   ACTIVITY LIMITATIONS: carrying, lifting, bending, squatting, stairs, and reach over head  PARTICIPATION LIMITATIONS: driving, community activity, occupation, and church  PERSONAL FACTORS: chronic systolic heart failure, hx of MI, heart failure, cardiac cath April 2023 and stent, HTN, renal disorder, CVA March 2023 are also affecting patient's functional outcome.   REHAB POTENTIAL: Good  CLINICAL DECISION MAKING: Stable/uncomplicated  EVALUATION COMPLEXITY: Moderate    Excell Seltzer, PT, DPT, CSRS 01/12/2023, 3:38 PM

## 2023-01-19 ENCOUNTER — Ambulatory Visit: Payer: 59 | Admitting: Orthopedic Surgery

## 2023-01-19 ENCOUNTER — Ambulatory Visit: Payer: 59 | Admitting: Podiatry

## 2023-07-13 ENCOUNTER — Ambulatory Visit: Payer: 59 | Attending: Internal Medicine | Admitting: Physical Therapy

## 2023-07-13 VITALS — BP 113/63 | HR 61

## 2023-07-13 DIAGNOSIS — R2689 Other abnormalities of gait and mobility: Secondary | ICD-10-CM | POA: Diagnosis present

## 2023-07-13 DIAGNOSIS — R2681 Unsteadiness on feet: Secondary | ICD-10-CM | POA: Insufficient documentation

## 2023-07-13 DIAGNOSIS — M6281 Muscle weakness (generalized): Secondary | ICD-10-CM | POA: Diagnosis present

## 2023-07-13 NOTE — Therapy (Signed)
OUTPATIENT PHYSICAL THERAPY NEURO EVALUATION   Patient Name: Christopher Morales MRN: [id number] DOB:09/06/1971, 52 y.o., male Today's Date: 07/13/2023   PCP: Wilfred Curtis, MD REFERRING PROVIDER: Wilfred Curtis, MD  END OF SESSION:  PT End of Session - 07/13/23 1401     Visit Number 1    Number of Visits 10    Date for PT Re-Evaluation 10/05/23   to allow for scheduling delays   Authorization Type [employer]    Authorization - Visit Number 20    Authorization - Number of Visits 30    Progress Note Due on Visit 10    PT Start Time 1400    PT Stop Time 1455    PT Time Calculation (min) 55 min    Equipment Utilized During Treatment Gait belt    Activity Tolerance Patient tolerated treatment well    Behavior During Therapy WFL for tasks assessed/performed             Past Medical History:  Diagnosis Date   CHF (congestive heart failure) (HCC)    Hypertension    Renal disorder    No past surgical history on file. Patient Active Problem List   Diagnosis Date Noted   OSA (obstructive sleep apnea) 09/24/2022   Chronic combined systolic and diastolic congestive heart failure (HCC) 09/24/2022   Nocturnal hypoxia 09/24/2022    ONSET DATE: 06/25/2023 (referral date)  REFERRING DIAG: Z86.73 (ICD-10-CM) - Personal history of transient ischemic attack (TIA), and cerebral infarction without residual deficits  THERAPY DIAG:  Muscle weakness (generalized)  Unsteadiness on feet  Other abnormalities of gait and mobility  Rationale for Evaluation and Treatment: Rehabilitation  SUBJECTIVE:                                                                                                                                                                                             SUBJECTIVE STATEMENT: Pt familiar to this clinic as he was seen from Nov 2023-March 2024 following his CVA by this therapist.  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 He states that he is finally able to ambulate across uneven ground. Pt states that "some days are good and some days are not as good" regarding his mobility.  Pt continues to have muscle stiffness (PCP suggested cutting statin in half) and wakes up with pain his legs first thing in the morning, it gets better as the day goes on. Pt has not had any issues with the heel spurs, he is  wearing shoe inserts. Pt also reports he continues to have "overwhelming numbness" in hands and feet and from head to toe that has not resolved since his stroke. He also reports ongoing angina pain that his cardiologist is aware of, stops activity if he feels the angina.  Pt has not yet returned to driving, has been referred to a return to driving program.  Pt stays at his own house during the day, returns to mom's for dinner and to sleep. Being at his own home during the day has increased his activity level. He really noticed an improvement in July but then did notice more stiffness again once he started walking outdoors more.   Pt accompanied by: self  PERTINENT HISTORY: chronic systolic heart failure, hx of MI, heart failure, cardiac cath April 2023 and stent, HTN, renal disorder, CVA March 2023  PAIN:  Are you having pain? No "I always live with some degree of it"  PRECAUTIONS: Fall  RED FLAGS: None   WEIGHT BEARING RESTRICTIONS: No  FALLS: Has patient fallen in last 6 months? No  LIVING ENVIRONMENT: Lives with: lives with their family stays at [facility] house overnight, stays at his house during the day Lives in: House/apartment Stairs: Yes: External: 3-4 steps; on right going up 3-4 at mom's house; 5 STE at his house with one handrail for both Has following equipment at home: Quad cane small base and Walker - 2 wheeled  PLOF: Independent with gait, Independent with transfers, and Requires assistive device for independence  PATIENT  GOALS: "hills, curbs, modify the exercise list from last time to see what is most helpful, want to improve my endurance"  OBJECTIVE:   DIAGNOSTIC FINDINGS: None relevant to this POC, chronic CVA  COGNITION: Overall cognitive status: Within functional limits for tasks assessed   SENSATION: N/T in BUE and BLE and even all through trunk, L>R   POSTURE: rounded shoulders, forward head, and posterior pelvic tilt  LOWER EXTREMITY ROM:     Active  Right Eval Left Eval  Hip flexion    Hip extension    Hip abduction    Hip adduction    Hip internal rotation    Hip external rotation    Knee flexion    Knee extension Tight HS Tight HS  Ankle dorsiflexion    Ankle plantarflexion  Foot tends to rest in inverted and PF position  Ankle inversion    Ankle eversion     (Blank rows = not tested)  LOWER EXTREMITY MMT:    MMT Right Eval Left Eval  Hip flexion 5 4+  Hip extension    Hip abduction    Hip adduction    Hip internal rotation    Hip external rotation    Knee flexion 5 4  Knee extension 5 5  Ankle dorsiflexion 5 5  Ankle plantarflexion    Ankle inversion    Ankle eversion    (Blank rows = not tested)  BED MOBILITY:  More difficulty getting out first thing in the morning  TRANSFERS: Assistive device utilized: None  Sit to stand: Modified independence Stand to sit: Modified independence Chair to chair: Modified independence Floor: not assessed at eval  GAIT: Gait pattern: decreased hip/knee flexion- Left, decreased ankle dorsiflexion- Left, and circumduction- Left Distance walked: various clinic distances Assistive device utilized: Quad cane small base Level of assistance: Modified independence Comments: LLE looks "stiff"  FUNCTIONAL TESTS:    [facility] PT Assessment - 07/13/23 1432       Ambulation/Gait  Gait velocity 32.8 ft over 11.97 sec = 2.74 ft/sec      Standardized Balance Assessment   Standardized Balance Assessment Timed Up and Go Test;Five  Times Sit to Stand;Berg Balance Test    Five times sit to stand comments  19.94 sec   no UE     Berg Balance Test   Sit to Stand Able to stand without using hands and stabilize independently    Standing Unsupported Able to stand safely 2 minutes    Sitting with Back Unsupported but Feet Supported on Floor or Stool Able to sit safely and securely 2 minutes    Stand to Sit Sits safely with minimal use of hands    Transfers Able to transfer safely, minor use of hands    Standing Unsupported with Eyes Closed Able to stand 10 seconds safely    Standing Unsupported with Feet Together Able to place feet together independently and stand for 1 minute with supervision    From Standing, Reach Forward with Outstretched Arm Can reach forward >5 cm safely (2")    From Standing Position, Pick up Object from Floor Unable to pick up and needs supervision    From Standing Position, Turn to Look Behind Over each Shoulder Looks behind one side only/other side shows less weight shift    Turn 360 Degrees Needs close supervision or verbal cueing    Standing Unsupported, Alternately Place Feet on Step/Stool Able to complete >2 steps/needs minimal assist    Standing Unsupported, One Foot in Front Able to plae foot ahead of the other independently and hold 30 seconds    Standing on One Leg Able to lift leg independently and hold equal to or more than 3 seconds    Total Score 40    Berg comment: 40/56      Timed Up and Go Test   TUG Normal TUG    Normal TUG (seconds) 14.34   with SBQC             TODAY'S TREATMENT:                                                                                                                              PT Evaluation  Vitals:   07/13/23 1416  BP: 113/63  Pulse: 61      PATIENT EDUCATION: Education details: Eval findings, results of OM, PT POC Person educated: Patient Education method: Explanation Education comprehension: verbalized understanding and needs further  education  HOME EXERCISE PROGRAM: To be initiated  [facility] (from prior POC)  GOALS: Goals reviewed with patient? Yes  SHORT TERM GOALS: Target date: 08/10/2023    Pt will be independent with initial HEP for improved strength, balance, transfers and gait.  Baseline: Goal status: INITIAL  2.  Pt will improve 5 x STS to less than or equal to 16 seconds to demonstrate improved functional strength and transfer efficiency.  Baseline: 19.94 sec no UE (9/16) Goal status: INITIAL  3.  Pt will improve gait velocity to at least 3.0 ft/sec for improved gait efficiency and performance at mod I level  Baseline: 2.74 ft/sec with SBQC mod I (9/16) Goal status: INITIAL  4.  Pt will improve Berg score to 44/56 for decreased fall risk Baseline: 40/56 (9/16) Goal status: INITIAL  5.  Pt will initiate gait training up/down curbs and hills with LRAD Baseline:  Goal status: INITIAL   LONG TERM GOALS: Target date: 09/07/2023    Pt will be independent with final HEP for improved strength, balance, transfers and gait. Baseline:  Goal status: INITIAL  2.  Pt will improve 5 x STS to less than or equal to 13 seconds to demonstrate improved functional strength and transfer efficiency.  Baseline: 19.94 sec no UE (9/16)  Goal status: INITIAL  3.  Pt will improve gait velocity to at least 3.25 ft/sec for improved gait efficiency and performance at mod I level  Baseline: 2.74 ft/sec with SBQC mod I (9/16) Goal status: INITIAL  4.  Pt will improve normal TUG to less than or equal to 13 seconds for improved functional mobility and decreased fall risk. Baseline: 14.34 sec with SBQC (9/16) Goal status: INITIAL  5.  Pt will improve Berg score to 48/56 for decreased fall risk Baseline: 40/56 (9/16) Goal status: INITIAL  6.  Pt will be able to navigate up/down curbs and hills with LRAD at mod I level Baseline:  Goal status: INITIAL  ASSESSMENT:  CLINICAL IMPRESSION: Patient is a 52 year old  male referred to Neuro OPPT for chronic CVA.   Pt's PMH is significant for: chronic systolic heart failure, hx of MI, heart failure, cardiac cath April 2023 and stent, HTN, renal disorder, CVA March 2023. The following deficits were present during the exam: decreased LLE strength and ROM, gait impairments, impaired balance, impaired endurance, and increased pain. Based on his 5xSTS score, gait speed, and BBS score, pt is an increased risk for falls. Pt would benefit from skilled PT to address these impairments and functional limitations to maximize functional mobility independence.   OBJECTIVE IMPAIRMENTS: Abnormal gait, cardiopulmonary status limiting activity, decreased activity tolerance, decreased balance, decreased endurance, decreased mobility, difficulty walking, decreased ROM, decreased strength, impaired perceived functional ability, impaired sensation, and pain.   ACTIVITY LIMITATIONS: carrying, lifting, bending, standing, stairs, transfers, and bed mobility  PARTICIPATION LIMITATIONS: driving, community activity, and occupation  PERSONAL FACTORS: Time since onset of injury/illness/exacerbation, Transportation, and 3+ comorbidities:   chronic systolic heart failure, hx of MI, heart failure, cardiac cath April 2023 and stent, HTN, renal disorder, CVA March 2023are also affecting patient's functional outcome.   REHAB POTENTIAL: Good  CLINICAL DECISION MAKING: Stable/uncomplicated  EVALUATION COMPLEXITY: Low  PLAN:  PT FREQUENCY: 1x/week  PT DURATION: 10 sessions  PLANNED INTERVENTIONS: Therapeutic exercises, Therapeutic activity, Neuromuscular re-education, Balance training, Gait training, Patient/Family education, Self Care, Joint mobilization, Stair training, Vestibular training, Canalith repositioning, Visual/preceptual remediation/compensation, Orthotic/Fit training, DME instructions, Dry Needling, Electrical stimulation, Cryotherapy, Moist heat, Taping, Manual therapy, and  Re-evaluation  PLAN FOR NEXT SESSION: review his prior HEP and revise as appropriate, work on L ankle and gastroc ROM, assess gait more (LLE)    Peter Congo, PT, DPT, CSRS  07/13/2023, 3:04 PM

## 2023-07-27 ENCOUNTER — Ambulatory Visit: Payer: 59 | Admitting: Physical Therapy

## 2023-07-27 VITALS — BP 103/63 | HR 76

## 2023-07-27 DIAGNOSIS — R2689 Other abnormalities of gait and mobility: Secondary | ICD-10-CM

## 2023-07-27 DIAGNOSIS — M6281 Muscle weakness (generalized): Secondary | ICD-10-CM

## 2023-07-27 DIAGNOSIS — R2681 Unsteadiness on feet: Secondary | ICD-10-CM

## 2023-07-27 NOTE — Therapy (Signed)
OUTPATIENT PHYSICAL THERAPY NEURO TREATMENT   Patient Name: Christopher Morales MRN: [id number] DOB:Dec 02, 1970, 52 y.o., male Today's Date: 07/27/2023   PCP: Wilfred Curtis, MD REFERRING PROVIDER: Wilfred Curtis, MD  END OF SESSION:  PT End of Session - 07/27/23 1531     Visit Number 2    Number of Visits 10    Date for PT Re-Evaluation 10/05/23   to allow for scheduling delays   Authorization Type [employer]    Authorization - Number of Visits 30    Progress Note Due on Visit 10    PT Start Time 1530    PT Stop Time 1617    PT Time Calculation (min) 47 min    Equipment Utilized During Treatment Gait belt    Activity Tolerance Patient tolerated treatment well    Behavior During Therapy WFL for tasks assessed/performed              Past Medical History:  Diagnosis Date   CHF (congestive heart failure) (HCC)    Hypertension    Renal disorder    No past surgical history on file. Patient Active Problem List   Diagnosis Date Noted   OSA (obstructive sleep apnea) 09/24/2022   Chronic combined systolic and diastolic congestive heart failure (HCC) 09/24/2022   Nocturnal hypoxia 09/24/2022    ONSET DATE: 06/25/2023 (referral date)  REFERRING DIAG: Z86.73 (ICD-10-CM) - Personal history of transient ischemic attack (TIA), and cerebral infarction without residual deficits  THERAPY DIAG:  Muscle weakness (generalized)  Unsteadiness on feet  Other abnormalities of gait and mobility  Rationale for Evaluation and Treatment: Rehabilitation  SUBJECTIVE:                                                                                                                                                                                             SUBJECTIVE STATEMENT: Pt reports he has been "stable" since seen for initial eval, some days are better than others. Pt reports no falls, ongoing low-grade pain/discomfort that is worse on L side compared to R side, described as dull/achy pain. Pt  reports ongoing stiffness in his L side, increases with fatigue. Pt reports his stiffness is worse in the morning but as he gets up and moves around it does come back down to its normal level.  Pt accompanied by: self  PERTINENT HISTORY: chronic systolic heart failure, hx of MI, heart failure, cardiac cath April 2023 and stent, HTN, renal disorder, CVA March 2023  PAIN:  Are you having pain? No "I always live with some degree of it"  PRECAUTIONS: Fall  RED FLAGS: None  WEIGHT BEARING RESTRICTIONS: No  FALLS: Has patient fallen in last 6 months? No  LIVING ENVIRONMENT: Lives with: lives with their family stays at [facility] house overnight, stays at his house during the day Lives in: House/apartment Stairs: Yes: External: 3-4 steps; on right going up 3-4 at mom's house; 5 STE at his house with one handrail for both Has following equipment at home: Quad cane small base and Walker - 2 wheeled  PLOF: Independent with gait, Independent with transfers, and Requires assistive device for independence  PATIENT GOALS: "hills, curbs, modify the exercise list from last time to see what is most helpful, want to improve my endurance"  OBJECTIVE:   DIAGNOSTIC FINDINGS: None relevant to this POC, chronic CVA  COGNITION: Overall cognitive status: Within functional limits for tasks assessed   SENSATION: N/T in BUE and BLE and even all through trunk, L>R   POSTURE: rounded shoulders, forward head, and posterior pelvic tilt  LOWER EXTREMITY ROM:     Active  Right Eval Left Eval  Hip flexion    Hip extension    Hip abduction    Hip adduction    Hip internal rotation    Hip external rotation    Knee flexion    Knee extension Tight HS Tight HS  Ankle dorsiflexion    Ankle plantarflexion  Foot tends to rest in inverted and PF position  Ankle inversion    Ankle eversion     (Blank rows = not tested)  LOWER EXTREMITY MMT:    MMT Right Eval Left Eval  Hip flexion 5 4+  Hip  extension    Hip abduction    Hip adduction    Hip internal rotation    Hip external rotation    Knee flexion 5 4  Knee extension 5 5  Ankle dorsiflexion 5 5  Ankle plantarflexion    Ankle inversion    Ankle eversion    (Blank rows = not tested)    TODAY'S TREATMENT:                                                                                                                               TherAct Vitals:   07/27/23 1541 07/27/23 1542  BP: 117/62 103/63  Pulse: (!) 57 76  BP assessed in sitting and then in standing, pt noted to have drop in BP with position change. Pt does not have "dizziness" necessarily but does feel off balance.  Education with patient on orthostatic hypotension and on how to safely manage that at home (discussed compression stockings, abdominal binder, isometric exercises). Recommended that pt start with isometric exercises to see if that affects his BP then he can try compression stockings as he already has these at home. Will assess effect on BP next session and refer patient back to PCP if warranted.   TherEx SciFit multi-peaks level 3 for 5 minutes using BUE/BLEs for neural priming for reciprocal movement, dynamic cardiovascular warmup and  increased amplitude of stepping. RPE of 8/10 following activity.   BP assessed following activity on SciFit: 130/83  Standing gastroc stretch x 30 sec on LLE Seated DF and eversion stretch with use of gait belt on LLE 3 x 30 sec each Seated toe flexion/extension stretch 3 x 30 sec each on LLE  Added to/reviewed from HEP, see bolded below    PATIENT EDUCATION: Education details: continue HEP with emphasis on reviewed and added exercises to address LLE tightness Person educated: Patient Education method: Explanation, Demonstration, Tactile cues, Verbal cues, and Handouts Education comprehension: verbalized understanding, returned demonstration, and needs further education  HOME EXERCISE PROGRAM: Access Code:  [email or web address] (from prior POC) URL: [email or web address] Date: 07/27/2023 Prepared by: Peter Congo  Exercises - Mini Squat with Counter Support  - 1 x daily - 7 x weekly - 3 sets - 10 reps - Heel Raises with Counter Support  - 1 x daily - 7 x weekly - 3 sets - 10 reps - Supine Bridge  - 1 x daily - 7 x weekly - 3 sets - 10 reps - Supine Single Bent Knee Fallout  - 1 x daily - 7 x weekly - 3 sets - 10 reps - Supine Single Knee to Chest Stretch  - 1 x daily - 7 x weekly - 2 sets - 5 reps - 30-60 sec hold - Supine Piriformis Stretch with Leg Straight  - 1 x daily - 7 x weekly - 2 sets - 5 reps - 30-60 sec hold - Seated Hamstring Stretch with Strap  - 1 x daily - 7 x weekly - 2 sets - 5 reps - 30-60 sec hold - Modified Thomas Stretch  - 1 x daily - 7 x weekly - 3 sets - 30-45 second hold - Alternating Step Taps with Counter Support  - 1 x daily - 7 x weekly - 3 sets - 10 reps - Staggered Sit-to-Stand  - 1 x daily - 7 x weekly - 3 sets - 10 reps - Ankle Inversion with Resistance  - 1 x daily - 7 x weekly - 3 sets - 10 reps - Long Sitting Calf Stretch with Strap  - 1 x daily - 7 x weekly - 1 sets - 10 reps - 30 sec hold - Gastroc Stretch on Wall  - 1 x daily - 7 x weekly - 1 sets - 10 reps - 30 sec hold - Wide Tandem Stance with Eyes Open  - 1 x daily - 7 x weekly - 1 sets - 5 reps - 30 sec hold - Romberg Stance with Eyes Closed  - 1 x daily - 7 x weekly - 1 sets - 5 reps - 30 sec hold - Seated Toe Flexion Extension PROM  - 1 x daily - 7 x weekly - 1 sets - 5 reps - 30-60 sec hold  GOALS: Goals reviewed with patient? Yes  SHORT TERM GOALS: Target date: 08/10/2023   Pt will be independent with initial HEP for improved strength, balance, transfers and gait.  Baseline: Goal status: INITIAL  2.  Pt will improve 5 x STS to less than or equal to 16 seconds to demonstrate improved functional strength and transfer efficiency.  Baseline: 19.94 sec no UE (9/16) Goal status:  INITIAL  3.  Pt will improve gait velocity to at least 3.0 ft/sec for improved gait efficiency and performance at mod I level  Baseline: 2.74 ft/sec with SBQC mod I (9/16) Goal status: INITIAL  4.  Pt will improve Berg score to 44/56 for decreased fall risk Baseline: 40/56 (9/16) Goal status: INITIAL  5.  Pt will initiate gait training up/down curbs and hills with LRAD Baseline:  Goal status: INITIAL   LONG TERM GOALS: Target date: 09/07/2023   Pt will be independent with final HEP for improved strength, balance, transfers and gait. Baseline:  Goal status: INITIAL  2.  Pt will improve 5 x STS to less than or equal to 13 seconds to demonstrate improved functional strength and transfer efficiency.  Baseline: 19.94 sec no UE (9/16)  Goal status: INITIAL  3.  Pt will improve gait velocity to at least 3.25 ft/sec for improved gait efficiency and performance at mod I level  Baseline: 2.74 ft/sec with SBQC mod I (9/16) Goal status: INITIAL  4.  Pt will improve normal TUG to less than or equal to 13 seconds for improved functional mobility and decreased fall risk. Baseline: 14.34 sec with SBQC (9/16) Goal status: INITIAL  5.  Pt will improve Berg score to 48/56 for decreased fall risk Baseline: 40/56 (9/16) Goal status: INITIAL  6.  Pt will be able to navigate up/down curbs and hills with LRAD at mod I level Baseline:  Goal status: INITIAL  ASSESSMENT:  CLINICAL IMPRESSION: Emphasis of skilled PT session on assessing BP, reviewing prior HEP, and adding to HEP to address LLE tightness. Pt noted to have orthostatic hypotension this date but BP does respond appropriately to exercise. Encouraged pt to wear compression stockings and perform isometric exercises to increase BP before transfers. Pt with ongoing tightness in his LLE, benefits from addition of stretches to his HEP. Pt continues to benefit from skilled therapy services to continue to work on LLE strength, ROM, and balance  in order to decrease his fall risk and increase his safety and independence with functional mobility. Continue POC.   OBJECTIVE IMPAIRMENTS: Abnormal gait, cardiopulmonary status limiting activity, decreased activity tolerance, decreased balance, decreased endurance, decreased mobility, difficulty walking, decreased ROM, decreased strength, impaired perceived functional ability, impaired sensation, and pain.   ACTIVITY LIMITATIONS: carrying, lifting, bending, standing, stairs, transfers, and bed mobility  PARTICIPATION LIMITATIONS: driving, community activity, and occupation  PERSONAL FACTORS: Time since onset of injury/illness/exacerbation, Transportation, and 3+ comorbidities:    chronic systolic heart failure, hx of MI, heart failure, cardiac cath April 2023 and stent, HTN, renal disorder, CVA March 2023are also affecting patient's functional outcome.   REHAB POTENTIAL: Good  CLINICAL DECISION MAKING: Stable/uncomplicated  EVALUATION COMPLEXITY: Low  PLAN:  PT FREQUENCY: 1x/week  PT DURATION:  10 sessions  PLANNED INTERVENTIONS: Therapeutic exercises, Therapeutic activity, Neuromuscular re-education, Balance training, Gait training, Patient/Family education, Self Care, Joint mobilization, Stair training, Vestibular training, Canalith repositioning, Visual/preceptual remediation/compensation, Orthotic/Fit training, DME instructions, Dry Needling, Electrical stimulation, Cryotherapy, Moist heat, Taping, Manual therapy, and Re-evaluation  PLAN FOR NEXT SESSION: review his prior HEP and revise as appropriate, how is HEP to work on L ankle and gastroc ROM?, assess gait more (LLE), check BP with compression stockings, work on curbs and work up to [employer], PT, DPT, CSRS  07/27/2023, 4:18 PM

## 2023-08-03 ENCOUNTER — Ambulatory Visit: Payer: 59 | Attending: Internal Medicine | Admitting: Physical Therapy

## 2023-08-03 VITALS — BP 101/66 | HR 86

## 2023-08-03 DIAGNOSIS — R208 Other disturbances of skin sensation: Secondary | ICD-10-CM | POA: Insufficient documentation

## 2023-08-03 DIAGNOSIS — M6281 Muscle weakness (generalized): Secondary | ICD-10-CM | POA: Insufficient documentation

## 2023-08-03 DIAGNOSIS — R2681 Unsteadiness on feet: Secondary | ICD-10-CM | POA: Diagnosis present

## 2023-08-03 DIAGNOSIS — R2689 Other abnormalities of gait and mobility: Secondary | ICD-10-CM | POA: Diagnosis present

## 2023-08-03 NOTE — Therapy (Signed)
OUTPATIENT PHYSICAL THERAPY NEURO TREATMENT   Patient Name: Christopher Morales MRN: [id number] DOB:[birth year], 52 y.o., male Today's Date: 08/03/2023   PCP: Wilfred Curtis, MD REFERRING PROVIDER: Wilfred Curtis, MD  END OF SESSION:  PT End of Session - 08/03/23 1358     Visit Number 3    Number of Visits 10    Date for PT Re-Evaluation 10/05/23   to allow for scheduling delays   Authorization Type [employer]    Authorization - Number of Visits 30    Progress Note Due on Visit 10    PT Start Time 1400    PT Stop Time 1447    PT Time Calculation (min) 47 min    Equipment Utilized During Treatment Gait belt    Activity Tolerance Patient tolerated treatment well    Behavior During Therapy WFL for tasks assessed/performed               Past Medical History:  Diagnosis Date   CHF (congestive heart failure) (HCC)    Hypertension    Renal disorder    No past surgical history on file. Patient Active Problem List   Diagnosis Date Noted   OSA (obstructive sleep apnea) 09/24/2022   Chronic combined systolic and diastolic congestive heart failure (HCC) 09/24/2022   Nocturnal hypoxia 09/24/2022    ONSET DATE: 06/25/2023 (referral date)  REFERRING DIAG: Z86.73 (ICD-10-CM) - Personal history of transient ischemic attack (TIA), and cerebral infarction without residual deficits  THERAPY DIAG:  Muscle weakness (generalized)  Unsteadiness on feet  Other abnormalities of gait and mobility  Rationale for Evaluation and Treatment: Rehabilitation  SUBJECTIVE:                                                                                                                                                                                             SUBJECTIVE STATEMENT:  "Nothing new", denies falls or any acute changes. Home exercises are going fine, Reports only being able to sit for about 45 minutes  Pt accompanied by: self  PERTINENT HISTORY: chronic systolic heart failure, hx of MI,  heart failure, cardiac cath April 2023 and stent, HTN, renal disorder, CVA March 2023  PAIN:  Are you having pain?  Yes,  "I always live with some degree of it around a 3"  PRECAUTIONS: Fall  RED FLAGS: None   WEIGHT BEARING RESTRICTIONS: No  FALLS: Has patient fallen in last 6 months? No  LIVING ENVIRONMENT: Lives with: lives with their family stays at [facility] house overnight, stays at his house during the day Lives in: House/apartment Stairs: Yes: External: 3-4 steps; on right  going up 3-4 at mom's house; 5 STE at his house with one handrail for both Has following equipment at home: Quad cane small base and Walker - 2 wheeled  PLOF: Independent with gait, Independent with transfers, and Requires assistive device for independence  PATIENT GOALS: "hills, curbs, modify the exercise list from last time to see what is most helpful, want to improve my endurance"  OBJECTIVE:   DIAGNOSTIC FINDINGS: None relevant to this POC, chronic CVA  COGNITION: Overall cognitive status: Within functional limits for tasks assessed   SENSATION: N/T in BUE and BLE and even all through trunk, L>R   POSTURE: rounded shoulders, forward head, and posterior pelvic tilt  LOWER EXTREMITY ROM:     Active  Right Eval Left Eval  Hip flexion    Hip extension    Hip abduction    Hip adduction    Hip internal rotation    Hip external rotation    Knee flexion    Knee extension Tight HS Tight HS  Ankle dorsiflexion    Ankle plantarflexion  Foot tends to rest in inverted and PF position  Ankle inversion    Ankle eversion     (Blank rows = not tested)  LOWER EXTREMITY MMT:    MMT Right Eval Left Eval  Hip flexion 5 4+  Hip extension    Hip abduction    Hip adduction    Hip internal rotation    Hip external rotation    Knee flexion 5 4  Knee extension 5 5  Ankle dorsiflexion 5 5  Ankle plantarflexion    Ankle inversion    Ankle eversion    (Blank rows = not tested)    TODAY'S  TREATMENT:                                                                                                                         TherAct Vitals:   08/03/23 1412 08/03/23 1413  BP: (!) 103/59 101/66  Pulse: 66 86  Seated; Standing  Education with patient on foot inversion and plantarflexion positioning and how that has contributed to walking on the outside of the foot  described and visually demonstrated supination  Gait Ambulated 530 ft with six pronged cane and CGA  over various surfaces including tile, door thresholds, rubber mat, sidewalk, grass, and gravel up and down inclines and declines Several 180* turns performed slowly w/o assist Pt notes tightness in BLEs, takes multiple standing breaks increased instability in L ankle when ambulating in grass  Curb Training x6 repetitions with close SBA  progressing to min A when patient fatigues and cannot catch balance by self Educated on sequencing:: Ascend: cane, strong leg, weak leg. Descend: cane, weak leg, strong leg.     TherEx Seated ankle dorsiflexion stretch 2 x 60 second hold on L side Instructed to cross stap and pull more on the outside strap than the inside one  On stairs with BUE support and close SBA: heel raises  to eccentric lowering off step x10 reps 30 second hold off edge of step in gastroc stretch   PATIENT EDUCATION:  Education details: how to tell other people what is "wrong" with his L foot- tone, weakness, and muscle imbalance Person educated: Patient Education method: Explanation and Demonstration Education comprehension: verbalized understanding and needs further education  HOME EXERCISE PROGRAM: Access Code: [email or web address] (from prior POC) URL: [email or web address] Date: 07/27/2023 Prepared by: Peter Congo  Exercises - Mini Squat with Counter Support  - 1 x daily - 7 x weekly - 3 sets - 10 reps - Heel Raises with Counter Support  - 1 x daily - 7 x weekly - 3 sets - 10 reps - Supine  Bridge  - 1 x daily - 7 x weekly - 3 sets - 10 reps - Supine Single Bent Knee Fallout  - 1 x daily - 7 x weekly - 3 sets - 10 reps - Supine Single Knee to Chest Stretch  - 1 x daily - 7 x weekly - 2 sets - 5 reps - 30-60 sec hold - Supine Piriformis Stretch with Leg Straight  - 1 x daily - 7 x weekly - 2 sets - 5 reps - 30-60 sec hold - Seated Hamstring Stretch with Strap  - 1 x daily - 7 x weekly - 2 sets - 5 reps - 30-60 sec hold - Modified Thomas Stretch  - 1 x daily - 7 x weekly - 3 sets - 30-45 second hold - Alternating Step Taps with Counter Support  - 1 x daily - 7 x weekly - 3 sets - 10 reps - Staggered Sit-to-Stand  - 1 x daily - 7 x weekly - 3 sets - 10 reps - Ankle Inversion with Resistance  - 1 x daily - 7 x weekly - 3 sets - 10 reps - Long Sitting Calf Stretch with Strap  - 1 x daily - 7 x weekly - 1 sets - 10 reps - 30 sec hold - Gastroc Stretch on Wall  - 1 x daily - 7 x weekly - 1 sets - 10 reps - 30 sec hold - Wide Tandem Stance with Eyes Open  - 1 x daily - 7 x weekly - 1 sets - 5 reps - 30 sec hold - Romberg Stance with Eyes Closed  - 1 x daily - 7 x weekly - 1 sets - 5 reps - 30 sec hold - Seated Toe Flexion Extension PROM  - 1 x daily - 7 x weekly - 1 sets - 5 reps - 30-60 sec hold  GOALS: Goals reviewed with patient? Yes  SHORT TERM GOALS: Target date: 08/10/2023   Pt will be independent with initial HEP for improved strength, balance, transfers and gait.  Baseline: Goal status: INITIAL  2.  Pt will improve 5 x STS to less than or equal to 16 seconds to demonstrate improved functional strength and transfer efficiency.  Baseline: 19.94 sec no UE (9/16) Goal status: INITIAL  3.  Pt will improve gait velocity to at least 3.0 ft/sec for improved gait efficiency and performance at mod I level  Baseline: 2.74 ft/sec with SBQC mod I (9/16) Goal status: INITIAL  4.  Pt will improve Berg score to 44/56 for decreased fall risk Baseline: 40/56 (9/16) Goal status:  INITIAL  5.  Pt will initiate gait training up/down curbs and hills with LRAD Baseline:  Goal status: INITIAL   LONG TERM GOALS: Target date: 09/07/2023   Pt will be  independent with final HEP for improved strength, balance, transfers and gait. Baseline:  Goal status: INITIAL  2.  Pt will improve 5 x STS to less than or equal to 13 seconds to demonstrate improved functional strength and transfer efficiency.  Baseline: 19.94 sec no UE (9/16)  Goal status: INITIAL  3.  Pt will improve gait velocity to at least 3.25 ft/sec for improved gait efficiency and performance at mod I level  Baseline: 2.74 ft/sec with SBQC mod I (9/16) Goal status: INITIAL  4.  Pt will improve normal TUG to less than or equal to 13 seconds for improved functional mobility and decreased fall risk. Baseline: 14.34 sec with SBQC (9/16) Goal status: INITIAL  5.  Pt will improve Berg score to 48/56 for decreased fall risk Baseline: 40/56 (9/16) Goal status: INITIAL  6.  Pt will be able to navigate up/down curbs and hills with LRAD at mod I level Baseline:  Goal status: INITIAL  ASSESSMENT:  CLINICAL IMPRESSION:  Emphasis of skilled PT session on outdoors ambulation over inclines, declines, and various surfaces, curb negotiation training, calf stretching, and education on findings on foot positioning. Pt with decreased safety as he fatigues, otherwise demonstrates good understanding of how to safely navigate a curb and ambulate on inclines and declines. Continue POC.   OBJECTIVE IMPAIRMENTS: Abnormal gait, cardiopulmonary status limiting activity, decreased activity tolerance, decreased balance, decreased endurance, decreased mobility, difficulty walking, decreased ROM, decreased strength, impaired perceived functional ability, impaired sensation, and pain.   ACTIVITY LIMITATIONS: carrying, lifting, bending, standing, stairs, transfers, and bed mobility  PARTICIPATION LIMITATIONS: driving, community  activity, and occupation  PERSONAL FACTORS: Time since onset of injury/illness/exacerbation, Transportation, and 3+ comorbidities:    chronic systolic heart failure, hx of MI, heart failure, cardiac cath April 2023 and stent, HTN, renal disorder, CVA March 2023 are also affecting patient's functional outcome.   REHAB POTENTIAL: Good  CLINICAL DECISION MAKING: Stable/uncomplicated  EVALUATION COMPLEXITY: Low  PLAN:  PT FREQUENCY: 1x/week  PT DURATION:  10 sessions  PLANNED INTERVENTIONS: Therapeutic exercises, Therapeutic activity, Neuromuscular re-education, Balance training, Gait training, Patient/Family education, Self Care, Joint mobilization, Stair training, Vestibular training, Canalith repositioning, Visual/preceptual remediation/compensation, Orthotic/Fit training, DME instructions, Dry Needling, Electrical stimulation, Cryotherapy, Moist heat, Taping, Manual therapy, and Re-evaluation  PLAN FOR NEXT SESSION: review his prior HEP and revise as appropriate, how is HEP to work on L ankle and gastroc ROM?, assess gait more (LLE), check BP with compression stockings, work on curbs and work up to Teaching laboratory technician, fall recovery/ getting up from floor, brace?   Beverely Low, SPT  Peter Congo, PT, DPT, CSRS  08/03/2023, 2:47 PM

## 2023-08-10 ENCOUNTER — Ambulatory Visit: Payer: 59 | Admitting: Physical Therapy

## 2023-08-14 ENCOUNTER — Ambulatory Visit: Payer: 59 | Admitting: Physical Therapy

## 2023-08-14 VITALS — BP 103/56 | HR 68

## 2023-08-14 DIAGNOSIS — R2689 Other abnormalities of gait and mobility: Secondary | ICD-10-CM

## 2023-08-14 DIAGNOSIS — R208 Other disturbances of skin sensation: Secondary | ICD-10-CM

## 2023-08-14 DIAGNOSIS — M6281 Muscle weakness (generalized): Secondary | ICD-10-CM | POA: Diagnosis not present

## 2023-08-14 DIAGNOSIS — R2681 Unsteadiness on feet: Secondary | ICD-10-CM

## 2023-08-14 NOTE — Therapy (Signed)
OUTPATIENT PHYSICAL THERAPY NEURO TREATMENT   Patient Name: Christopher Morales MRN: [id number] DOB:08-07-1971, 52 y.o., male Today's Date: 08/14/2023   PCP: Wilfred Curtis, MD REFERRING PROVIDER: Wilfred Curtis, MD  END OF SESSION:  PT End of Session - 08/14/23 1310     Visit Number 4    Number of Visits 10    Date for PT Re-Evaluation 10/05/23   to allow for scheduling delays   Authorization Type [employer]    Authorization - Number of Visits 30    Progress Note Due on Visit 10    PT Start Time 1315    PT Stop Time 1359    PT Time Calculation (min) 44 min    Equipment Utilized During Treatment Gait belt    Activity Tolerance Patient tolerated treatment well    Behavior During Therapy WFL for tasks assessed/performed                Past Medical History:  Diagnosis Date   CHF (congestive heart failure) (HCC)    Hypertension    Renal disorder    No past surgical history on file. Patient Active Problem List   Diagnosis Date Noted   OSA (obstructive sleep apnea) 09/24/2022   Chronic combined systolic and diastolic congestive heart failure (HCC) 09/24/2022   Nocturnal hypoxia 09/24/2022    ONSET DATE: 06/25/2023 (referral date)  REFERRING DIAG: Z86.73 (ICD-10-CM) - Personal history of transient ischemic attack (TIA), and cerebral infarction without residual deficits  THERAPY DIAG:  Muscle weakness (generalized)  Unsteadiness on feet  Other abnormalities of gait and mobility  Other disturbances of skin sensation  Rationale for Evaluation and Treatment: Rehabilitation  SUBJECTIVE:                                                                                                                                                                                             SUBJECTIVE STATEMENT:  Denies falls or any acute changes. Felt fine after last session "but then the next day I did a few laps around the house and by the end of the day my knees hurt. Maybe I did a little  too much." Knees felt tender to the touch, better today.  "No better no worse"  Pt accompanied by: self  PERTINENT HISTORY: chronic systolic heart failure, hx of MI, heart failure, cardiac cath April 2023 and stent, HTN, renal disorder, CVA March 2023  PAIN:  Are you having pain?  Yes,  "I always live with some degree of it around a 3"  PRECAUTIONS: Fall  RED FLAGS: None   WEIGHT BEARING RESTRICTIONS: No  FALLS: Has  patient fallen in last 6 months? No  LIVING ENVIRONMENT: Lives with: lives with their family stays at [facility] house overnight, stays at his house during the day Lives in: House/apartment Stairs: Yes: External: 3-4 steps; on right going up 3-4 at mom's house; 5 STE at his house with one handrail for both Has following equipment at home: Quad cane small base and Walker - 2 wheeled  PLOF: Independent with gait, Independent with transfers, and Requires assistive device for independence  PATIENT GOALS: "hills, curbs, modify the exercise list from last time to see what is most helpful, want to improve my endurance"  OBJECTIVE:   DIAGNOSTIC FINDINGS: None relevant to this POC, chronic CVA  COGNITION: Overall cognitive status: Within functional limits for tasks assessed   SENSATION: N/T in BUE and BLE and even all through trunk, L>R   POSTURE: rounded shoulders, forward head, and posterior pelvic tilt  LOWER EXTREMITY ROM:     Active  Right Eval Left Eval  Hip flexion    Hip extension    Hip abduction    Hip adduction    Hip internal rotation    Hip external rotation    Knee flexion    Knee extension Tight HS Tight HS  Ankle dorsiflexion    Ankle plantarflexion  Foot tends to rest in inverted and PF position  Ankle inversion    Ankle eversion     (Blank rows = not tested)  LOWER EXTREMITY MMT:    MMT Right Eval Left Eval  Hip flexion 5 4+  Hip extension    Hip abduction    Hip adduction    Hip internal rotation    Hip external rotation     Knee flexion 5 4  Knee extension 5 5  Ankle dorsiflexion 5 5  Ankle plantarflexion    Ankle inversion    Ankle eversion    (Blank rows = not tested)    TODAY'S TREATMENT:                                                                                                                         TherAct Vitals:   08/14/23 1323  BP: (!) 103/56  Pulse: 68   Seated   [facility] PT Assessment - 08/14/23 0001       Ambulation/Gait   Gait velocity 32.8 ft over 10.63 sec= 3.09 ft/sec      Standardized Balance Assessment   Five times sit to stand comments  20.68 sec   no UE     Berg Balance Test   Sit to Stand Able to stand without using hands and stabilize independently    Standing Unsupported Able to stand safely 2 minutes    Sitting with Back Unsupported but Feet Supported on Floor or Stool Able to sit safely and securely 2 minutes    Stand to Sit Sits safely with minimal use of hands    Transfers Able to transfer safely, minor use of hands  Standing Unsupported with Eyes Closed Able to stand 10 seconds safely    Standing Unsupported with Feet Together Able to place feet together independently and stand 1 minute safely    From Standing, Reach Forward with Outstretched Arm Can reach forward >12 cm safely (5")    From Standing Position, Pick up Object from Floor Able to pick up shoe, needs supervision    From Standing Position, Turn to Look Behind Over each Shoulder Looks behind one side only/other side shows less weight shift    Turn 360 Degrees Able to turn 360 degrees safely but slowly    Standing Unsupported, Alternately Place Feet on Step/Stool Able to complete >2 steps/needs minimal assist    Standing Unsupported, One Foot in Front Able to take small step independently and hold 30 seconds    Standing on One Leg Tries to lift leg/unable to hold 3 seconds but remains standing independently    Total Score 43    Berg comment: 43/56, significant fall risk              PATIENT  EDUCATION:  Education details: lifting safety due to balance, outcome measures, plan of care Person educated: Patient Education method: Explanation and Demonstration Education comprehension: verbalized understanding and needs further education  HOME EXERCISE PROGRAM: Access Code: [email or web address] (from prior POC) URL: [email or web address] Date: 07/27/2023 Prepared by: Peter Congo  Exercises - Mini Squat with Counter Support  - 1 x daily - 7 x weekly - 3 sets - 10 reps - Heel Raises with Counter Support  - 1 x daily - 7 x weekly - 3 sets - 10 reps - Supine Bridge  - 1 x daily - 7 x weekly - 3 sets - 10 reps - Supine Single Bent Knee Fallout  - 1 x daily - 7 x weekly - 3 sets - 10 reps - Supine Single Knee to Chest Stretch  - 1 x daily - 7 x weekly - 2 sets - 5 reps - 30-60 sec hold - Supine Piriformis Stretch with Leg Straight  - 1 x daily - 7 x weekly - 2 sets - 5 reps - 30-60 sec hold - Seated Hamstring Stretch with Strap  - 1 x daily - 7 x weekly - 2 sets - 5 reps - 30-60 sec hold - Modified Thomas Stretch  - 1 x daily - 7 x weekly - 3 sets - 30-45 second hold - Alternating Step Taps with Counter Support  - 1 x daily - 7 x weekly - 3 sets - 10 reps - Staggered Sit-to-Stand  - 1 x daily - 7 x weekly - 3 sets - 10 reps - Ankle Inversion with Resistance  - 1 x daily - 7 x weekly - 3 sets - 10 reps - Long Sitting Calf Stretch with Strap  - 1 x daily - 7 x weekly - 1 sets - 10 reps - 30 sec hold - Gastroc Stretch on Wall  - 1 x daily - 7 x weekly - 1 sets - 10 reps - 30 sec hold - Wide Tandem Stance with Eyes Open  - 1 x daily - 7 x weekly - 1 sets - 5 reps - 30 sec hold - Romberg Stance with Eyes Closed  - 1 x daily - 7 x weekly - 1 sets - 5 reps - 30 sec hold - Seated Toe Flexion Extension PROM  - 1 x daily - 7 x weekly - 1 sets - 5 reps - 30-60 sec  hold  GOALS: Goals reviewed with patient? Yes  SHORT TERM GOALS: Target date: 08/10/2023    Pt will be independent with  initial HEP for improved strength, balance, transfers and gait.  Baseline: "I do them regularly and try to stay as active as I can" Goal status: MET  2.  Pt will improve 5 x STS to less than or equal to 16 seconds to demonstrate improved functional strength and transfer efficiency.  Baseline: 19.94 sec no UE (9/16), 20.68 sec no UE (10/18) Goal status: NOT MET  3.  Pt will improve gait velocity to at least 3.0 ft/sec for improved gait efficiency and performance at mod I level  Baseline: 2.74 ft/sec with SBQC mod I (9/16), 3.09 ft/sec (10/18) Goal status: MET   4.  Pt will improve Berg score to 44/56 for decreased fall risk Baseline: 40/56 (9/16), 43/56 Goal status: PROGRESSING  5.  Pt will initiate gait training up/down curbs and hills with LRAD Baseline: practiced 08/03/23 Goal status: MET   LONG TERM GOALS: Target date: 09/07/2023   Pt will be independent with final HEP for improved strength, balance, transfers and gait. Baseline:  Goal status: INITIAL  2.  Pt will improve 5 x STS to less than or equal to 16 seconds to demonstrate improved functional strength and transfer efficiency.  Baseline: 19.94 sec no UE (9/16), 20.68 sec no UE (10/18) Goal status: MODIFIED  3.  Pt will improve gait velocity to at least 3.25 ft/sec for improved gait efficiency and performance at mod I level  Baseline: 2.74 ft/sec with SBQC mod I (9/16),  Goal status: INITIAL  4.  Pt will improve normal TUG to less than or equal to 13 seconds for improved functional mobility and decreased fall risk. Baseline: 14.34 sec with SBQC (9/16) Goal status: INITIAL  5.  Pt will improve Berg score to 48/56 for decreased fall risk Baseline: 40/56 (9/16) Goal status: INITIAL  6.  Pt will be able to navigate up/down curbs and hills with LRAD at mod I level Baseline:  Goal status: INITIAL  ASSESSMENT:  CLINICAL IMPRESSION:  Emphasis of skilled PT session on assessing STGs. This date, pt met 3/5 STGs, is  making progress on 1/5 STGs, and has not met 1/5 STGs. Pt shows good adherence to HEP, has improved his gait speed to 3.09 ft/sec, and has initiated ramp and curb practice with his personal cane in clinic. Pt has made progress on Berg scoring 43/52 (significant fall risk), and scored slightly worse than at eval for 5x STS. Pt remains a fall risk demonstrated by Berg score and 5x STS time. Deficits remain in reaching outside BOS, picking up objects from the floor, turning, stepping, and SLS. Continue POC.   OBJECTIVE IMPAIRMENTS: Abnormal gait, cardiopulmonary status limiting activity, decreased activity tolerance, decreased balance, decreased endurance, decreased mobility, difficulty walking, decreased ROM, decreased strength, impaired perceived functional ability, impaired sensation, and pain.   ACTIVITY LIMITATIONS: carrying, lifting, bending, standing, stairs, transfers, and bed mobility  PARTICIPATION LIMITATIONS: driving, community activity, and occupation  PERSONAL FACTORS: Time since onset of injury/illness/exacerbation, Transportation, and 3+ comorbidities:    chronic systolic heart failure, hx of MI, heart failure, cardiac cath April 2023 and stent, HTN, renal disorder, CVA March 2023 are also affecting patient's functional outcome.   REHAB POTENTIAL: Good  CLINICAL DECISION MAKING: Stable/uncomplicated  EVALUATION COMPLEXITY: Low  PLAN:  PT FREQUENCY: 1x/week  PT DURATION:  10 sessions  PLANNED INTERVENTIONS: Therapeutic exercises, Therapeutic activity, Neuromuscular re-education, Balance training, Gait  training, Patient/Family education, Self Care, Joint mobilization, Stair training, Vestibular training, Canalith repositioning, Visual/preceptual remediation/compensation, Orthotic/Fit training, DME instructions, Dry Needling, Electrical stimulation, Cryotherapy, Moist heat, Taping, Manual therapy, and Re-evaluation  PLAN FOR NEXT SESSION: review his prior HEP and revise as  appropriate, how is HEP to work on L ankle and gastroc ROM?, assess gait more (LLE), check BP with compression stockings, work on curbs and work up to Teaching laboratory technician, fall recovery/ getting up from floor, brace, Lifting activities "I have a hard time carrying water" hand grip and balance both contributing   Beverely Low, SPT  Peter Congo, PT, DPT, CSRS  08/14/2023, 3:01 PM

## 2023-08-17 ENCOUNTER — Ambulatory Visit: Payer: 59 | Admitting: Physical Therapy

## 2023-08-17 VITALS — BP 107/53 | HR 65

## 2023-08-17 DIAGNOSIS — R2681 Unsteadiness on feet: Secondary | ICD-10-CM

## 2023-08-17 DIAGNOSIS — R2689 Other abnormalities of gait and mobility: Secondary | ICD-10-CM

## 2023-08-17 DIAGNOSIS — R208 Other disturbances of skin sensation: Secondary | ICD-10-CM

## 2023-08-17 DIAGNOSIS — M6281 Muscle weakness (generalized): Secondary | ICD-10-CM | POA: Diagnosis not present

## 2023-08-17 NOTE — Therapy (Addendum)
OUTPATIENT PHYSICAL THERAPY NEURO TREATMENT   Patient Name: Christopher Morales MRN: [id number] DOB:Oct 11, 1971, 52 y.o., male Today's Date: 08/17/2023   PCP: Wilfred Curtis, MD REFERRING PROVIDER: Wilfred Curtis, MD  END OF SESSION:  PT End of Session - 08/17/23 1401     Visit Number 5    Number of Visits 10    Date for PT Re-Evaluation 10/05/23   to allow for scheduling delays   Authorization Type [employer]    Authorization - Number of Visits 30    Progress Note Due on Visit 10    PT Start Time 1403    PT Stop Time 1445    PT Time Calculation (min) 42 min    Equipment Utilized During Treatment Gait belt    Activity Tolerance Patient tolerated treatment well    Behavior During Therapy WFL for tasks assessed/performed                 Past Medical History:  Diagnosis Date   CHF (congestive heart failure) (HCC)    Hypertension    Renal disorder    No past surgical history on file. Patient Active Problem List   Diagnosis Date Noted   OSA (obstructive sleep apnea) 09/24/2022   Chronic combined systolic and diastolic congestive heart failure (HCC) 09/24/2022   Nocturnal hypoxia 09/24/2022    ONSET DATE: 06/25/2023 (referral date)  REFERRING DIAG: Z86.73 (ICD-10-CM) - Personal history of transient ischemic attack (TIA), and cerebral infarction without residual deficits  THERAPY DIAG:  Muscle weakness (generalized)  Unsteadiness on feet  Other abnormalities of gait and mobility  Other disturbances of skin sensation  Rationale for Evaluation and Treatment: Rehabilitation  SUBJECTIVE:                                                                                                                                                                                             SUBJECTIVE STATEMENT:  Denies falls or any acute changes.  "About the same"  Pt accompanied by: self  PERTINENT HISTORY: chronic systolic heart failure, hx of MI, heart failure, cardiac cath April  2023 and stent, HTN, renal disorder, CVA March 2023  PAIN:  Are you having pain?  Yes,  "I always live with some degree of it around a 3"  PRECAUTIONS: Fall  RED FLAGS: None   WEIGHT BEARING RESTRICTIONS: No  FALLS: Has patient fallen in last 6 months? No  LIVING ENVIRONMENT: Lives with: lives with their family stays at [facility] house overnight, stays at his house during the day Lives in: House/apartment Stairs: Yes: External: 3-4 steps; on right going up 3-4 at [employer]  house; 5 STE at his house with one handrail for both Has following equipment at home: Quad cane small base and Walker - 2 wheeled  PLOF: Independent with gait, Independent with transfers, and Requires assistive device for independence  PATIENT GOALS: "hills, curbs, modify the exercise list from last time to see what is most helpful, want to improve my endurance"  OBJECTIVE:   DIAGNOSTIC FINDINGS: None relevant to this POC, chronic CVA  COGNITION: Overall cognitive status: Within functional limits for tasks assessed   SENSATION: N/T in BUE and BLE and even all through trunk, L>R   POSTURE: rounded shoulders, forward head, and posterior pelvic tilt  LOWER EXTREMITY ROM:     Active  Right Eval Left Eval  Hip flexion    Hip extension    Hip abduction    Hip adduction    Hip internal rotation    Hip external rotation    Knee flexion    Knee extension Tight HS Tight HS  Ankle dorsiflexion    Ankle plantarflexion  Foot tends to rest in inverted and PF position  Ankle inversion    Ankle eversion     (Blank rows = not tested)  LOWER EXTREMITY MMT:    MMT Right Eval Left Eval  Hip flexion 5 4+  Hip extension    Hip abduction    Hip adduction    Hip internal rotation    Hip external rotation    Knee flexion 5 4  Knee extension 5 5  Ankle dorsiflexion 5 5  Ankle plantarflexion    Ankle inversion    Ankle eversion    (Blank rows = not tested)    TODAY'S TREATMENT:                                                                                                                          TherAct Vitals:   08/17/23 1411  BP: (!) 107/53  Pulse: 65   Orthotic Assessment AFO trials on LLE Posterior-Medial strut - [place] Improved dorsiflexion duration in swing phase "I feel like I walked more normal with the brace than without" 2 x 115 ft CGA  Posterior-Lateral strut- [place] Pt only takes a few steps before deciding to go back to the [place] "I dont like this one"  Gait Outdoors Gait on [employer] Gait pattern: step through pattern, decreased step length- Right, decreased step length- Left, decreased hip/knee flexion- Left, circumduction- Left, Right hip hike, and poor foot clearance- Left Distance walked: 779ft w/ several standing rest breaks Assistive device utilized:  medial strut AFO, personal 6-pt cane Level of assistance: CGA and Min A Comments: over various surfaces including tile, thresholds, rubber mats, sidewalk, inclines and declines, up and down curbs, and grass; pt does have increase in L ankle inversion with gait over grass - more noticeable with onset of fatigue   PATIENT EDUCATION:  Education details: AFOs, walking practice at home Person educated: Patient Education method: Psychiatrist  comprehension: verbalized understanding and needs further education  HOME EXERCISE PROGRAM: Access Code: [email or web address] (from prior POC) URL: [email or web address] Date: 07/27/2023 Prepared by: Peter Congo  Exercises - Mini Squat with Counter Support  - 1 x daily - 7 x weekly - 3 sets - 10 reps - Heel Raises with Counter Support  - 1 x daily - 7 x weekly - 3 sets - 10 reps - Supine Bridge  - 1 x daily - 7 x weekly - 3 sets - 10 reps - Supine Single Bent Knee Fallout  - 1 x daily - 7 x weekly - 3 sets - 10 reps - Supine Single Knee to Chest Stretch  - 1 x daily - 7 x weekly - 2 sets - 5 reps - 30-60 sec hold - Supine  Piriformis Stretch with Leg Straight  - 1 x daily - 7 x weekly - 2 sets - 5 reps - 30-60 sec hold - Seated Hamstring Stretch with Strap  - 1 x daily - 7 x weekly - 2 sets - 5 reps - 30-60 sec hold - Modified Thomas Stretch  - 1 x daily - 7 x weekly - 3 sets - 30-45 second hold - Alternating Step Taps with Counter Support  - 1 x daily - 7 x weekly - 3 sets - 10 reps - Staggered Sit-to-Stand  - 1 x daily - 7 x weekly - 3 sets - 10 reps - Ankle Inversion with Resistance  - 1 x daily - 7 x weekly - 3 sets - 10 reps - Long Sitting Calf Stretch with Strap  - 1 x daily - 7 x weekly - 1 sets - 10 reps - 30 sec hold - Gastroc Stretch on Wall  - 1 x daily - 7 x weekly - 1 sets - 10 reps - 30 sec hold - Wide Tandem Stance with Eyes Open  - 1 x daily - 7 x weekly - 1 sets - 5 reps - 30 sec hold - Romberg Stance with Eyes Closed  - 1 x daily - 7 x weekly - 1 sets - 5 reps - 30 sec hold - Seated Toe Flexion Extension PROM  - 1 x daily - 7 x weekly - 1 sets - 5 reps - 30-60 sec hold  GOALS: Goals reviewed with patient? Yes  SHORT TERM GOALS: Target date: 08/10/2023    Pt will be independent with initial HEP for improved strength, balance, transfers and gait.  Baseline: "I do them regularly and try to stay as active as I can" Goal status: MET  2.  Pt will improve 5 x STS to less than or equal to 16 seconds to demonstrate improved functional strength and transfer efficiency.  Baseline: 19.94 sec no UE (9/16), 20.68 sec no UE (10/18) Goal status: NOT MET  3.  Pt will improve gait velocity to at least 3.0 ft/sec for improved gait efficiency and performance at mod I level  Baseline: 2.74 ft/sec with SBQC mod I (9/16), 3.09 ft/sec (10/18) Goal status: MET   4.  Pt will improve Berg score to 44/56 for decreased fall risk Baseline: 40/56 (9/16), 43/56 Goal status: PROGRESSING  5.  Pt will initiate gait training up/down curbs and hills with LRAD Baseline: practiced 08/03/23 Goal status: MET   LONG  TERM GOALS: Target date: 09/07/2023   Pt will be independent with final HEP for improved strength, balance, transfers and gait. Baseline:  Goal status: INITIAL  2.  Pt will improve 5 x  STS to less than or equal to 16 seconds to demonstrate improved functional strength and transfer efficiency.  Baseline: 19.94 sec no UE (9/16), 20.68 sec no UE (10/18) Goal status: MODIFIED  3.  Pt will improve gait velocity to at least 3.25 ft/sec for improved gait efficiency and performance at mod I level  Baseline: 2.74 ft/sec with SBQC mod I (9/16),  Goal status: INITIAL  4.  Pt will improve normal TUG to less than or equal to 13 seconds for improved functional mobility and decreased fall risk. Baseline: 14.34 sec with SBQC (9/16) Goal status: INITIAL  5.  Pt will improve Berg score to 48/56 for decreased fall risk Baseline: 40/56 (9/16) Goal status: INITIAL  6.  Pt will be able to navigate up/down curbs and hills with LRAD at mod I level Baseline:  Goal status: INITIAL  ASSESSMENT:  CLINICAL IMPRESSION:  Emphasis of skilled PT session on trialing AFOs and ambulating outdoors on grassy hills. Notable improvement in duration of dorsiflexion in swing phase with medial-strut AFO. Pt requires Min A to descend and ascend grassy hills as well as curbs and needs several standing breaks during outdoors ambulation of 780ft. Continue POC.   OBJECTIVE IMPAIRMENTS: Abnormal gait, cardiopulmonary status limiting activity, decreased activity tolerance, decreased balance, decreased endurance, decreased mobility, difficulty walking, decreased ROM, decreased strength, impaired perceived functional ability, impaired sensation, and pain.   ACTIVITY LIMITATIONS: carrying, lifting, bending, standing, stairs, transfers, and bed mobility  PARTICIPATION LIMITATIONS: driving, community activity, and occupation  PERSONAL FACTORS: Time since onset of injury/illness/exacerbation, Transportation, and 3+ comorbidities:     chronic systolic heart failure, hx of MI, heart failure, cardiac cath April 2023 and stent, HTN, renal disorder, CVA March 2023 are also affecting patient's functional outcome.   REHAB POTENTIAL: Good  CLINICAL DECISION MAKING: Stable/uncomplicated  EVALUATION COMPLEXITY: Low  PLAN:  PT FREQUENCY: 1x/week  PT DURATION:  10 sessions  PLANNED INTERVENTIONS: Therapeutic exercises, Therapeutic activity, Neuromuscular re-education, Balance training, Gait training, Patient/Family education, Self Care, Joint mobilization, Stair training, Vestibular training, Canalith repositioning, Visual/preceptual remediation/compensation, Orthotic/Fit training, DME instructions, Dry Needling, Electrical stimulation, Cryotherapy, Moist heat, Taping, Manual therapy, and Re-evaluation  PLAN FOR NEXT SESSION: review his prior HEP and revise as appropriate, how is HEP to work on L ankle and gastroc ROM?, assess gait more (LLE), check BP with compression stockings, work on curbs and work up to Teaching laboratory technician, fall recovery/ getting up from floor, Lifting activities "I have a hard time carrying water" hand grip and balance both contributing  Pt to bring personal brace if kept  Beverely Low, SPT  Peter Congo, PT, DPT, CSRS  08/17/2023, 4:43 PM

## 2023-08-24 ENCOUNTER — Ambulatory Visit: Payer: 59 | Admitting: Physical Therapy

## 2023-08-24 ENCOUNTER — Telehealth: Payer: Self-pay | Admitting: Physical Therapy

## 2023-08-24 VITALS — BP 100/58 | HR 57

## 2023-08-24 DIAGNOSIS — M6281 Muscle weakness (generalized): Secondary | ICD-10-CM

## 2023-08-24 DIAGNOSIS — R208 Other disturbances of skin sensation: Secondary | ICD-10-CM

## 2023-08-24 DIAGNOSIS — R2689 Other abnormalities of gait and mobility: Secondary | ICD-10-CM

## 2023-08-24 DIAGNOSIS — R2681 Unsteadiness on feet: Secondary | ICD-10-CM

## 2023-08-24 NOTE — Therapy (Signed)
OUTPATIENT PHYSICAL THERAPY NEURO TREATMENT   Patient Name: Christopher Morales MRN: [id number] DOB:07-May-1971, 52 y.o., male Today's Date: 08/24/2023   PCP: Wilfred Curtis, MD REFERRING PROVIDER: Wilfred Curtis, MD  END OF SESSION:  PT End of Session - 08/24/23 1347     Visit Number 6    Number of Visits 10    Date for PT Re-Evaluation 10/05/23   to allow for scheduling delays   Authorization Type [employer]    Authorization - Number of Visits 30    Progress Note Due on Visit 10    PT Start Time 1350    PT Stop Time 1500    PT Time Calculation (min) 70 min    Equipment Utilized During Treatment Gait belt    Activity Tolerance Patient tolerated treatment well    Behavior During Therapy WFL for tasks assessed/performed                  Past Medical History:  Diagnosis Date   CHF (congestive heart failure) (HCC)    Hypertension    Renal disorder    No past surgical history on file. Patient Active Problem List   Diagnosis Date Noted   OSA (obstructive sleep apnea) 09/24/2022   Chronic combined systolic and diastolic congestive heart failure (HCC) 09/24/2022   Nocturnal hypoxia 09/24/2022    ONSET DATE: 06/25/2023 (referral date)  REFERRING DIAG: Z86.73 (ICD-10-CM) - Personal history of transient ischemic attack (TIA), and cerebral infarction without residual deficits  THERAPY DIAG:  Muscle weakness (generalized)  Unsteadiness on feet  Other abnormalities of gait and mobility  Other disturbances of skin sensation  Rationale for Evaluation and Treatment: Rehabilitation  SUBJECTIVE:                                                                                                                                                                                             SUBJECTIVE STATEMENT:  Reports stomach bug that started Friday (10/25) and lasted through yesterday. Feels not at 100% but is getting his strength back. No fever. "Maybe it is accumulated stress." Pt  threw away his last AFO.   "My left foot is extra sensitive to temperatures"  but reports decreased sensation to light touch.   Pt accompanied by: self  PERTINENT HISTORY: chronic systolic heart failure, hx of MI, heart failure, cardiac cath April 2023 and stent, HTN, renal disorder, CVA March 2023  PAIN:  Are you having pain?  Yes,  "I always live with some degree of it around a 3"  PRECAUTIONS: Fall  RED FLAGS: None   WEIGHT BEARING RESTRICTIONS: No  FALLS:  Has patient fallen in last 6 months? No  LIVING ENVIRONMENT: Lives with: lives with their family stays at [facility] house overnight, stays at his house during the day Lives in: House/apartment Stairs: Yes: External: 3-4 steps; on right going up 3-4 at mom's house; 5 STE at his house with one handrail for both Has following equipment at home: Quad cane small base and Walker - 2 wheeled  PLOF: Independent with gait, Independent with transfers, and Requires assistive device for independence  PATIENT GOALS: "hills, curbs, modify the exercise list from last time to see what is most helpful, want to improve my endurance"  OBJECTIVE:   DIAGNOSTIC FINDINGS: None relevant to this POC, chronic CVA  COGNITION: Overall cognitive status: Within functional limits for tasks assessed   SENSATION: N/T in BUE and BLE and even all through trunk, L>R   POSTURE: rounded shoulders, forward head, and posterior pelvic tilt  LOWER EXTREMITY ROM:     Active  Right Eval Left Eval  Hip flexion    Hip extension    Hip abduction    Hip adduction    Hip internal rotation    Hip external rotation    Knee flexion    Knee extension Tight HS Tight HS  Ankle dorsiflexion    Ankle plantarflexion  Foot tends to rest in inverted and PF position  Ankle inversion    Ankle eversion     (Blank rows = not tested)  LOWER EXTREMITY MMT:    MMT Right Eval Left Eval  Hip flexion 5 4+  Hip extension    Hip abduction    Hip adduction    Hip  internal rotation    Hip external rotation    Knee flexion 5 4  Knee extension 5 5  Ankle dorsiflexion 5 5  Ankle plantarflexion    Ankle inversion    Ankle eversion    (Blank rows = not tested)    TODAY'S TREATMENT:                                                                                                                        TherAct Vitals:   08/24/23 1359  BP: (!) 100/58  Pulse: (!) 57   Educated pt on process for obtaining personal AFO and potential timeline/costs.   Assisted to don posterior-medial strut AFO, [place]  Ambulate 173ft indoors w/ close SBA, personal 6-pt cane, and ott[place] on LLE Second lap with 5# kettle bell in L hand at side "I can feel it in my left hip" Pt reports discomfort in chest at completion of activity, unsure if it is angina 6/10 RPE Third lap with 80% full styrofoam cup of water in L hand Pt spills when walking faster or taking larger step sizes Verbal and visual cues to roll feet, take smaller steps, and ambulate slowly with mild improvement in spilling Pt grips styrofoam to the point of indenting cup Pt reports carrying glass cups at home Educated on safety for carrying open  container liquids at home, including filling only half way and being wary of hot liquids  Gait Training Outdoors gait Gait pattern: step through pattern, decreased step length- Right, decreased step length- Left, decreased hip/knee flexion- Left, circumduction- Left, Right hip hike, and poor foot clearance- Left Distance walked: 237ft Assistive device utilized:  medial strut AFO, personal 6-pt cane Level of assistance: CGA and Min A Comments: over various surfaces including tile, thresholds, rubber mats, sidewalk, inclines and declines, gravel, rubber mulch, and moss  Educated pt about walking with eyes up and forwards rather than looking down at feet for safety; stepping up/down/over strategies for balance   Assisted to [employer] Fall  recovery practice x5 repetitions and education, CGA -> Min A Educated to check self for injuries and loss of consciousness if he were to fall alone, call for help if either occurred Educated it will be easiest to crawl towards sturdy furniture to use BUEs with Les to stand Demonstrated and verbally cued to use half-kneel method to stand from ground, leading w/ RLE (stronger) Pt elects to stand via deep squat, unsteadily. Potentially hip mobility limited for lunge stance Pt demonstrated he can transition from supine to quadruped if fell onto bottom Pt unsteady standing on mat on floor and stepping backwards off mat to sit, requires Min A for balance   PATIENT EDUCATION:  Education details: walking w/ grippy socks at home w/o shoes, AFOs, carrying water, looking up while walking, stepping up/down/over strategies, fall recovery and safety  Person educated: Patient Education method: Explanation, Demonstration, and Verbal cues Education comprehension: verbalized understanding, returned demonstration, and needs further education  HOME EXERCISE PROGRAM: Access Code: [email or web address] (from prior POC) URL: [email or web address] Date: 07/27/2023 Prepared by: Peter Congo  Exercises - Mini Squat with Counter Support  - 1 x daily - 7 x weekly - 3 sets - 10 reps - Heel Raises with Counter Support  - 1 x daily - 7 x weekly - 3 sets - 10 reps - Supine Bridge  - 1 x daily - 7 x weekly - 3 sets - 10 reps - Supine Single Bent Knee Fallout  - 1 x daily - 7 x weekly - 3 sets - 10 reps - Supine Single Knee to Chest Stretch  - 1 x daily - 7 x weekly - 2 sets - 5 reps - 30-60 sec hold - Supine Piriformis Stretch with Leg Straight  - 1 x daily - 7 x weekly - 2 sets - 5 reps - 30-60 sec hold - Seated Hamstring Stretch with Strap  - 1 x daily - 7 x weekly - 2 sets - 5 reps - 30-60 sec hold - Modified Thomas Stretch  - 1 x daily - 7 x weekly - 3 sets - 30-45 second hold - Alternating Step Taps with  Counter Support  - 1 x daily - 7 x weekly - 3 sets - 10 reps - Staggered Sit-to-Stand  - 1 x daily - 7 x weekly - 3 sets - 10 reps - Ankle Inversion with Resistance  - 1 x daily - 7 x weekly - 3 sets - 10 reps - Long Sitting Calf Stretch with Strap  - 1 x daily - 7 x weekly - 1 sets - 10 reps - 30 sec hold - Gastroc Stretch on Wall  - 1 x daily - 7 x weekly - 1 sets - 10 reps - 30 sec hold - Wide Tandem Stance with Eyes Open  - 1 x  daily - 7 x weekly - 1 sets - 5 reps - 30 sec hold - Romberg Stance with Eyes Closed  - 1 x daily - 7 x weekly - 1 sets - 5 reps - 30 sec hold - Seated Toe Flexion Extension PROM  - 1 x daily - 7 x weekly - 1 sets - 5 reps - 30-60 sec hold  GOALS: Goals reviewed with patient? Yes  SHORT TERM GOALS: Target date: 08/10/2023    Pt will be independent with initial HEP for improved strength, balance, transfers and gait.  Baseline: "I do them regularly and try to stay as active as I can" Goal status: MET  2.  Pt will improve 5 x STS to less than or equal to 16 seconds to demonstrate improved functional strength and transfer efficiency.  Baseline: 19.94 sec no UE (9/16), 20.68 sec no UE (10/18) Goal status: NOT MET  3.  Pt will improve gait velocity to at least 3.0 ft/sec for improved gait efficiency and performance at mod I level  Baseline: 2.74 ft/sec with SBQC mod I (9/16), 3.09 ft/sec (10/18) Goal status: MET   4.  Pt will improve Berg score to 44/56 for decreased fall risk Baseline: 40/56 (9/16), 43/56 Goal status: PROGRESSING  5.  Pt will initiate gait training up/down curbs and hills with LRAD Baseline: practiced 08/03/23 Goal status: MET   LONG TERM GOALS: Target date: 09/07/2023   Pt will be independent with final HEP for improved strength, balance, transfers and gait. Baseline:  Goal status: INITIAL  2.  Pt will improve 5 x STS to less than or equal to 16 seconds to demonstrate improved functional strength and transfer efficiency.  Baseline:  19.94 sec no UE (9/16), 20.68 sec no UE (10/18) Goal status: MODIFIED  3.  Pt will improve gait velocity to at least 3.25 ft/sec for improved gait efficiency and performance at mod I level  Baseline: 2.74 ft/sec with SBQC mod I (9/16),  Goal status: INITIAL  4.  Pt will improve normal TUG to less than or equal to 13 seconds for improved functional mobility and decreased fall risk. Baseline: 14.34 sec with SBQC (9/16) Goal status: INITIAL  5.  Pt will improve Berg score to 48/56 for decreased fall risk Baseline: 40/56 (9/16) Goal status: INITIAL  6.  Pt will be able to navigate up/down curbs and hills with LRAD at mod I level Baseline:  Goal status: INITIAL  ASSESSMENT:  CLINICAL IMPRESSION:  Emphasis of skilled PT session on trialing [place] AFO while carrying 5# vs a cup of water, ambulating outdoors on uneven surfaces, and fall recovery education and practice. Pt with difficulty carrying water, not fully able to roll feet or slow gait enough to prevent spilling. No LOB on uneven outdoors surfaces with LLE [place] and 6-pt cane. Pt successful w/ CGA to stand x5 during fall recovery practice, reports that this activity is very energy consuming, Min A needed for standing balance on mat/while stepping backwards. Continue POC.   OBJECTIVE IMPAIRMENTS: Abnormal gait, cardiopulmonary status limiting activity, decreased activity tolerance, decreased balance, decreased endurance, decreased mobility, difficulty walking, decreased ROM, decreased strength, impaired perceived functional ability, impaired sensation, and pain.   ACTIVITY LIMITATIONS: carrying, lifting, bending, standing, stairs, transfers, and bed mobility  PARTICIPATION LIMITATIONS: driving, community activity, and occupation  PERSONAL FACTORS: Time since onset of injury/illness/exacerbation, Transportation, and 3+ comorbidities:    chronic systolic heart failure, hx of MI, heart failure, cardiac cath April 2023 and stent, HTN,  renal  disorder, CVA March 2023 are also affecting patient's functional outcome.   REHAB POTENTIAL: Good  CLINICAL DECISION MAKING: Stable/uncomplicated  EVALUATION COMPLEXITY: Low  PLAN:  PT FREQUENCY: 1x/week  PT DURATION:  10 sessions  PLANNED INTERVENTIONS: Therapeutic exercises, Therapeutic activity, Neuromuscular re-education, Balance training, Gait training, Patient/Family education, Self Care, Joint mobilization, Stair training, Vestibular training, Canalith repositioning, Visual/preceptual remediation/compensation, Orthotic/Fit training, DME instructions, Dry Needling, Electrical stimulation, Cryotherapy, Moist heat, Taping, Manual therapy, and Re-evaluation  PLAN FOR NEXT SESSION: review his prior HEP and revise as appropriate, how is HEP to work on L ankle and gastroc ROM?, check BP with compression stockings, work on curbs and work up to Teaching laboratory technician, fall recovery/ getting up from floor, Lifting activities, carrying water with various cups (mug, plastic), uneven surfaces, endurance activities, quadruped, hip mobility exercises?  Beverely Low, SPT  Peter Congo, PT, DPT, CSRS  08/24/2023, 3:32 PM

## 2023-08-24 NOTE — Telephone Encounter (Signed)
Dr. Burnetta Sabin,  Mr. Christopher Morales is being treated by physical therapy for L-sided weakness and gait impairments following his stroke.  Christopher Morales will benefit from use of a Left AFO in order to improve safety with functional mobility.    If you agree, please submit request in EPIC under MD Order, Other Orders (list Left AFO in comments) or fax to [facility] at [phone].   Thank you, Peter Congo PT, DPT, [facility] [address] [place], [place]  [address] Phone:  [phone] Fax:  [phone]

## 2023-08-31 ENCOUNTER — Ambulatory Visit: Payer: 59 | Admitting: Physical Therapy

## 2023-09-04 ENCOUNTER — Ambulatory Visit: Payer: 59 | Attending: Internal Medicine | Admitting: Physical Therapy

## 2023-09-04 DIAGNOSIS — R2689 Other abnormalities of gait and mobility: Secondary | ICD-10-CM | POA: Insufficient documentation

## 2023-09-04 DIAGNOSIS — M6281 Muscle weakness (generalized): Secondary | ICD-10-CM | POA: Diagnosis present

## 2023-09-04 DIAGNOSIS — R208 Other disturbances of skin sensation: Secondary | ICD-10-CM | POA: Diagnosis present

## 2023-09-04 DIAGNOSIS — R2681 Unsteadiness on feet: Secondary | ICD-10-CM | POA: Diagnosis present

## 2023-09-04 NOTE — Therapy (Signed)
OUTPATIENT PHYSICAL THERAPY NEURO TREATMENT   Patient Name: Christopher Morales MRN: [id number] DOB:09-May-1971, 52 y.o., male Today's Date: 09/04/2023   PCP: Wilfred Curtis, MD REFERRING PROVIDER: Wilfred Curtis, MD  END OF SESSION:  PT End of Session - 09/04/23 1401     Visit Number 7    Number of Visits 10    Date for PT Re-Evaluation 10/05/23   to allow for scheduling delays   Authorization Type [employer]    Authorization - Number of Visits 30    Progress Note Due on Visit 10    PT Start Time 1402    PT Stop Time 1446    PT Time Calculation (min) 44 min    Equipment Utilized During Treatment Gait belt    Activity Tolerance Patient tolerated treatment well    Behavior During Therapy WFL for tasks assessed/performed                   Past Medical History:  Diagnosis Date   CHF (congestive heart failure) (HCC)    Hypertension    Renal disorder    No past surgical history on file. Patient Active Problem List   Diagnosis Date Noted   OSA (obstructive sleep apnea) 09/24/2022   Chronic combined systolic and diastolic congestive heart failure (HCC) 09/24/2022   Nocturnal hypoxia 09/24/2022    ONSET DATE: 06/25/2023 (referral date)  REFERRING DIAG: Z86.73 (ICD-10-CM) - Personal history of transient ischemic attack (TIA), and cerebral infarction without residual deficits  THERAPY DIAG:  Muscle weakness (generalized)  Unsteadiness on feet  Other abnormalities of gait and mobility  Other disturbances of skin sensation  Rationale for Evaluation and Treatment: Rehabilitation  SUBJECTIVE:                                                                                                                                                                                             SUBJECTIVE STATEMENT:   Heard from [facility], needs to call them to schedule the appointment for an AFO. "As far as physically I am fine". Is feeling all better since the stomach bug. Reports that he has  been walking some hills outdoors. No reported falls or acute changes.  Pt accompanied by: self  PERTINENT HISTORY: chronic systolic heart failure, hx of MI, heart failure, cardiac cath April 2023 and stent, HTN, renal disorder, CVA March 2023  PAIN:  Are you having pain?  Yes,  "I always live with some degree of it around a 3"  PRECAUTIONS: Fall  RED FLAGS: None   WEIGHT BEARING RESTRICTIONS: No  FALLS: Has patient fallen in last 6 months?  No  LIVING ENVIRONMENT: Lives with: lives with their family stays at [facility] house overnight, stays at his house during the day Lives in: House/apartment Stairs: Yes: External: 3-4 steps; on right going up 3-4 at mom's house; 5 STE at his house with one handrail for both Has following equipment at home: Quad cane small base and Walker - 2 wheeled  PLOF: Independent with gait, Independent with transfers, and Requires assistive device for independence  PATIENT GOALS: "hills, curbs, modify the exercise list from last time to see what is most helpful, want to improve my endurance"  OBJECTIVE:   DIAGNOSTIC FINDINGS: None relevant to this POC, chronic CVA  COGNITION: Overall cognitive status: Within functional limits for tasks assessed   SENSATION: N/T in BUE and BLE and even all through trunk, L>R   POSTURE: rounded shoulders, forward head, and posterior pelvic tilt  LOWER EXTREMITY ROM:     Active  Right Eval Left Eval  Hip flexion    Hip extension    Hip abduction    Hip adduction    Hip internal rotation    Hip external rotation    Knee flexion    Knee extension Tight HS Tight HS  Ankle dorsiflexion    Ankle plantarflexion  Foot tends to rest in inverted and PF position  Ankle inversion    Ankle eversion     (Blank rows = not tested)  LOWER EXTREMITY MMT:    MMT Right Eval Left Eval  Hip flexion 5 4+  Hip extension    Hip abduction    Hip adduction    Hip internal rotation    Hip external rotation    Knee flexion  5 4  Knee extension 5 5  Ankle dorsiflexion 5 5  Ankle plantarflexion    Ankle inversion    Ankle eversion    (Blank rows = not tested)    TODAY'S TREATMENT:                                                                                                                        TherAct Engaged in active listening while patient shared concerns about world news, creating emotional distress. Offered verbal support to promote emotional well-being and coping.   Tall kneeling on mat w/ Kay table in front for balance, massed practice of tall kneeling to half kneeling transition performed bilaterally, CGA Educated pt on foot placement angle for optimal ability to push-to-stand if he were to be performing a floor transfer  Gait Training Outdoors gait Gait pattern: step through pattern, decreased step length- Right, decreased step length- Left, decreased hip/knee flexion- Left, circumduction- Left, Right hip hike, and poor foot clearance- Left Distance walked: >1052ft Assistive device utilized:  personal 6-pt cane Level of assistance: CGA Comments: over various surfaces including tile, thresholds, rubber mats, sidewalk, inclines and declines, curbs, steep grassy hills up and down x2, gentle grassy hills  Educated pt about positive manifestation, not being over confident, helping to create successful outcomes  when practicing hard tasks  PATIENT EDUCATION:  Education details: positive manifestation, repetitions leading to confidence, fall recovery skill  Person educated: Patient Education method: Explanation, Demonstration, and Verbal cues Education comprehension: verbalized understanding, returned demonstration, and needs further education  HOME EXERCISE PROGRAM: Access Code: [email or web address] (from prior POC) URL: [email or web address] Date: 07/27/2023 Prepared by: Peter Congo  Exercises - Mini Squat with Counter Support  - 1 x daily - 7 x weekly - 3 sets - 10 reps - Heel Raises  with Counter Support  - 1 x daily - 7 x weekly - 3 sets - 10 reps - Supine Bridge  - 1 x daily - 7 x weekly - 3 sets - 10 reps - Supine Single Bent Knee Fallout  - 1 x daily - 7 x weekly - 3 sets - 10 reps - Supine Single Knee to Chest Stretch  - 1 x daily - 7 x weekly - 2 sets - 5 reps - 30-60 sec hold - Supine Piriformis Stretch with Leg Straight  - 1 x daily - 7 x weekly - 2 sets - 5 reps - 30-60 sec hold - Seated Hamstring Stretch with Strap  - 1 x daily - 7 x weekly - 2 sets - 5 reps - 30-60 sec hold - Modified Thomas Stretch  - 1 x daily - 7 x weekly - 3 sets - 30-45 second hold - Alternating Step Taps with Counter Support  - 1 x daily - 7 x weekly - 3 sets - 10 reps - Staggered Sit-to-Stand  - 1 x daily - 7 x weekly - 3 sets - 10 reps - Ankle Inversion with Resistance  - 1 x daily - 7 x weekly - 3 sets - 10 reps - Long Sitting Calf Stretch with Strap  - 1 x daily - 7 x weekly - 1 sets - 10 reps - 30 sec hold - Gastroc Stretch on Wall  - 1 x daily - 7 x weekly - 1 sets - 10 reps - 30 sec hold - Wide Tandem Stance with Eyes Open  - 1 x daily - 7 x weekly - 1 sets - 5 reps - 30 sec hold - Romberg Stance with Eyes Closed  - 1 x daily - 7 x weekly - 1 sets - 5 reps - 30 sec hold - Seated Toe Flexion Extension PROM  - 1 x daily - 7 x weekly - 1 sets - 5 reps - 30-60 sec hold  GOALS: Goals reviewed with patient? Yes  SHORT TERM GOALS: Target date: 08/10/2023    Pt will be independent with initial HEP for improved strength, balance, transfers and gait.  Baseline: "I do them regularly and try to stay as active as I can" Goal status: MET  2.  Pt will improve 5 x STS to less than or equal to 16 seconds to demonstrate improved functional strength and transfer efficiency.  Baseline: 19.94 sec no UE (9/16), 20.68 sec no UE (10/18) Goal status: NOT MET  3.  Pt will improve gait velocity to at least 3.0 ft/sec for improved gait efficiency and performance at mod I level  Baseline: 2.74 ft/sec  with SBQC mod I (9/16), 3.09 ft/sec (10/18) Goal status: MET   4.  Pt will improve Berg score to 44/56 for decreased fall risk Baseline: 40/56 (9/16), 43/56 Goal status: PROGRESSING  5.  Pt will initiate gait training up/down curbs and hills with LRAD Baseline: practiced 08/03/23 Goal status: MET   LONG TERM  GOALS: Target date: 09/07/2023   Pt will be independent with final HEP for improved strength, balance, transfers and gait. Baseline:  Goal status: INITIAL  2.  Pt will improve 5 x STS to less than or equal to 16 seconds to demonstrate improved functional strength and transfer efficiency.  Baseline: 19.94 sec no UE (9/16), 20.68 sec no UE (10/18) Goal status: MODIFIED  3.  Pt will improve gait velocity to at least 3.25 ft/sec for improved gait efficiency and performance at mod I level  Baseline: 2.74 ft/sec with SBQC mod I (9/16),  Goal status: INITIAL  4.  Pt will improve normal TUG to less than or equal to 13 seconds for improved functional mobility and decreased fall risk. Baseline: 14.34 sec with SBQC (9/16) Goal status: INITIAL  5.  Pt will improve Berg score to 48/56 for decreased fall risk Baseline: 40/56 (9/16) Goal status: INITIAL  6.  Pt will be able to navigate up/down curbs and hills with LRAD at mod I level Baseline:  Goal status: INITIAL  ASSESSMENT:  CLINICAL IMPRESSION:  Emphasis of skilled PT session on ambulating outdoors on uneven surfaces and massed practice of transitioning between tall kneeling and half-kneeling on both sides. No LOB on uneven outdoors surfaces and steep hills with 6-pt cane and CGA, pt is pleasantly surprised and pleased. Pt with much effort to complete multiple transitions between tall kneeling and half-kneeling, hip mobility visibly improving on L with repetitions. Continue POC.   OBJECTIVE IMPAIRMENTS: Abnormal gait, cardiopulmonary status limiting activity, decreased activity tolerance, decreased balance, decreased endurance,  decreased mobility, difficulty walking, decreased ROM, decreased strength, impaired perceived functional ability, impaired sensation, and pain.   ACTIVITY LIMITATIONS: carrying, lifting, bending, standing, stairs, transfers, and bed mobility  PARTICIPATION LIMITATIONS: driving, community activity, and occupation  PERSONAL FACTORS: Time since onset of injury/illness/exacerbation, Transportation, and 3+ comorbidities:    chronic systolic heart failure, hx of MI, heart failure, cardiac cath April 2023 and stent, HTN, renal disorder, CVA March 2023 are also affecting patient's functional outcome.   REHAB POTENTIAL: Good  CLINICAL DECISION MAKING: Stable/uncomplicated  EVALUATION COMPLEXITY: Low  PLAN:  PT FREQUENCY: 1x/week  PT DURATION:  10 sessions  PLANNED INTERVENTIONS: Therapeutic exercises, Therapeutic activity, Neuromuscular re-education, Balance training, Gait training, Patient/Family education, Self Care, Joint mobilization, Stair training, Vestibular training, Canalith repositioning, Visual/preceptual remediation/compensation, Orthotic/Fit training, DME instructions, Dry Needling, Electrical stimulation, Cryotherapy, Moist heat, Taping, Manual therapy, and Re-evaluation  PLAN FOR NEXT SESSION: review his prior HEP and revise as appropriate, how is HEP to work on L ankle and gastroc ROM?, check BP with compression stockings, work on curbs and work up to Teaching laboratory technician, fall recovery/ getting up from floor, Lifting activities, carrying water with various cups (mug, plastic), uneven surfaces, endurance activities, quadruped, hip mobility exercises?   Pt wants to revise HEP- progress where applicable  Beverely Low, SPT  Peter Congo, PT, DPT, CSRS  09/04/2023, 3:04 PM

## 2023-09-07 ENCOUNTER — Ambulatory Visit: Payer: 59 | Admitting: Physical Therapy

## 2023-09-14 ENCOUNTER — Ambulatory Visit: Payer: 59 | Admitting: Physical Therapy

## 2023-09-14 VITALS — BP 117/81 | HR 77

## 2023-09-14 DIAGNOSIS — M6281 Muscle weakness (generalized): Secondary | ICD-10-CM | POA: Diagnosis not present

## 2023-09-14 DIAGNOSIS — R2681 Unsteadiness on feet: Secondary | ICD-10-CM

## 2023-09-14 DIAGNOSIS — R2689 Other abnormalities of gait and mobility: Secondary | ICD-10-CM

## 2023-09-14 NOTE — Therapy (Signed)
OUTPATIENT PHYSICAL THERAPY NEURO TREATMENT   Patient Name: Christopher Morales MRN: [id number] DOB:[birth year], 52 y.o., male Today's Date: 09/14/2023   PCP: Wilfred Curtis, MD REFERRING PROVIDER: Wilfred Curtis, MD  END OF SESSION:  PT End of Session - 09/14/23 1357     Visit Number 8    Number of Visits 10    Date for PT Re-Evaluation 10/05/23   to allow for scheduling delays   Authorization Type [employer]    Authorization - Number of Visits 30    Progress Note Due on Visit 10    PT Start Time 1358    PT Stop Time 1452    PT Time Calculation (min) 54 min    Equipment Utilized During Treatment Gait belt    Activity Tolerance Patient tolerated treatment well    Behavior During Therapy WFL for tasks assessed/performed                    Past Medical History:  Diagnosis Date   CHF (congestive heart failure) (HCC)    Hypertension    Renal disorder    No past surgical history on file. Patient Active Problem List   Diagnosis Date Noted   OSA (obstructive sleep apnea) 09/24/2022   Chronic combined systolic and diastolic congestive heart failure (HCC) 09/24/2022   Nocturnal hypoxia 09/24/2022    ONSET DATE: 06/25/2023 (referral date)  REFERRING DIAG: Z86.73 (ICD-10-CM) - Personal history of transient ischemic attack (TIA), and cerebral infarction without residual deficits  THERAPY DIAG:  Muscle weakness (generalized)  Unsteadiness on feet  Other abnormalities of gait and mobility  Rationale for Evaluation and Treatment: Rehabilitation  SUBJECTIVE:                                                                                                                                                                                             SUBJECTIVE STATEMENT:   "Rough couple of days", reports more GI issues, this time it lasted longer. Pt ate shellfish before both GI upset incidents, wonders if it is an allergy. "I didn't have the energy that I usually have over the last  few days." Denies falls.   Pt accompanied by: self  PERTINENT HISTORY: chronic systolic heart failure, hx of MI, heart failure, cardiac cath April 2023 and stent, HTN, renal disorder, CVA March 2023  PAIN:  Are you having pain?  Yes,  "I always live with some degree of it around a 3"  PRECAUTIONS: Fall  RED FLAGS: None   WEIGHT BEARING RESTRICTIONS: No  FALLS: Has patient fallen in last 6 months? No  LIVING ENVIRONMENT: Lives  with: lives with their family stays at [facility] house overnight, stays at his house during the day Lives in: House/apartment Stairs: Yes: External: 3-4 steps; on right going up 3-4 at mom's house; 5 STE at his house with one handrail for both Has following equipment at home: Quad cane small base and Walker - 2 wheeled  PLOF: Independent with gait, Independent with transfers, and Requires assistive device for independence  PATIENT GOALS: "hills, curbs, modify the exercise list from last time to see what is most helpful, want to improve my endurance"  OBJECTIVE:   DIAGNOSTIC FINDINGS: None relevant to this POC, chronic CVA  COGNITION: Overall cognitive status: Within functional limits for tasks assessed   SENSATION: N/T in BUE and BLE and even all through trunk, L>R   POSTURE: rounded shoulders, forward head, and posterior pelvic tilt  LOWER EXTREMITY ROM:     Active  Right Eval Left Eval  Hip flexion    Hip extension    Hip abduction    Hip adduction    Hip internal rotation    Hip external rotation    Knee flexion    Knee extension Tight HS Tight HS  Ankle dorsiflexion    Ankle plantarflexion  Foot tends to rest in inverted and PF position  Ankle inversion    Ankle eversion     (Blank rows = not tested)  LOWER EXTREMITY MMT:    MMT Right Eval Left Eval  Hip flexion 5 4+  Hip extension    Hip abduction    Hip adduction    Hip internal rotation    Hip external rotation    Knee flexion 5 4  Knee extension 5 5  Ankle  dorsiflexion 5 5  Ankle plantarflexion    Ankle inversion    Ankle eversion    (Blank rows = not tested)    TODAY'S TREATMENT:                                                                                                                        TherAct  Vitals:   09/14/23 1407  BP: 117/81  Pulse: 77    [facility] PT Assessment - 09/14/23 1413       Standardized Balance Assessment   Five times sit to stand comments  17.03   no ue             TherEx Countertop support heel raises x10 repetitions Pt rates as easy difficulty Progressed to alternating dorsiflexion and plantarflexion w/ countertop support x10 repetitions  Countertop support mini-squats x10 repetitions Pt rates as easy  Countertop support step-taps to 6" step x12 repetitions bilaterally Pt rates as medium, discussed incorporating more into home exercises  Staggered sit to stands w/ LLE back x10 repetitions Pt rates as medium Pt with difficulty keeping a full stagger between feet  NMR Wide-tandem eyes open x30 second hold performed bilaterally Pt rates as easy  Tandem eyes open x30 second hold performed bilaterally Pt rates as  medium, more difficulty w/ LLE in back  Romberg stance w/ eyes closed x30 second hold Pt rates as easy  Wide-tandem eyes closed x30 second hold performed bilaterally Pt rates as medium, more difficulty w/ LLE in back  Discussed safe home set up for balance exercises, emphasized performing in safe area w/ sturdy furniture nearby to hold onto  PATIENT EDUCATION:  Education details: HEP changes, "medium difficulty" at home, do not push yourself to the point of falling Person educated: Patient Education method: Explanation, Demonstration, Tactile cues, Verbal cues, and Handouts Education comprehension: verbalized understanding, returned demonstration, verbal cues required, tactile cues required, and needs further education  HOME EXERCISE PROGRAM:  Access Code: [email or web address] (from prior  POC) URL: [email or web address] Date: 07/27/2023 Prepared by: Peter Congo  Exercises - Heel Toe Raises with Counter Support  - 1 x daily - 7 x weekly - 3 sets - 10 reps - Alternating Step Taps with Counter Support  - 1 x daily - 7 x weekly - 3 sets - 10 reps - Tandem Stance  - 1 x daily - 7 x weekly - 3 sets - 10 reps - Wide Tandem Stance with Eyes Closed  - 1 x daily - 7 x weekly - 3 sets - 10 reps - Staggered Sit-to-Stand  - 1 x daily - 7 x weekly - 3 sets - 10 reps - Ankle Inversion with Resistance  - 1 x daily - 7 x weekly - 3 sets - 10 reps - Supine Bridge  - 1 x daily - 7 x weekly - 3 sets - 10 reps - Supine Single Bent Knee Fallout  - 1 x daily - 7 x weekly - 3 sets - 10 reps - Supine Single Knee to Chest Stretch  - 1 x daily - 7 x weekly - 2 sets - 5 reps - 30-60 sec hold - Supine Piriformis Stretch with Leg Straight  - 1 x daily - 7 x weekly - 2 sets - 5 reps - 30-60 sec hold - Seated Hamstring Stretch with Strap  - 1 x daily - 7 x weekly - 2 sets - 5 reps - 30-60 sec hold - Modified Thomas Stretch  - 1 x daily - 7 x weekly - 3 sets - 30-45 second hold - Long Sitting Calf Stretch with Strap  - 1 x daily - 7 x weekly - 1 sets - 10 reps - 30 sec hold - Gastroc Stretch on Wall  - 1 x daily - 7 x weekly - 1 sets - 10 reps - 30 sec hold - Seated Toe Flexion Extension PROM  - 1 x daily - 7 x weekly - 1 sets - 5 reps - 30-60 sec hold  GOALS: Goals reviewed with patient? Yes  SHORT TERM GOALS: Target date: 08/10/2023    Pt will be independent with initial HEP for improved strength, balance, transfers and gait.  Baseline: "I do them regularly and try to stay as active as I can" Goal status: MET  2.  Pt will improve 5 x STS to less than or equal to 16 seconds to demonstrate improved functional strength and transfer efficiency.  Baseline: 19.94 sec no UE (9/16), 20.68 sec no UE (10/18) Goal status: NOT MET  3.  Pt will improve gait velocity to at least 3.0 ft/sec  for improved gait efficiency and performance at mod I level  Baseline: 2.74 ft/sec with SBQC mod I (9/16), 3.09 ft/sec (10/18) Goal status: MET   4.  Pt will  improve Berg score to 44/56 for decreased fall risk Baseline: 40/56 (9/16), 43/56 Goal status: PROGRESSING  5.  Pt will initiate gait training up/down curbs and hills with LRAD Baseline: practiced 08/03/23 Goal status: MET   LONG TERM GOALS: Target date: 10/26/2023 (updated to match last day of POC)   Pt will be independent with final HEP for improved strength, balance, transfers and gait. Baseline:  Goal status: INITIAL  2.  Pt will improve 5 x STS to less than or equal to 16 seconds to demonstrate improved functional strength and transfer efficiency.  Baseline: 19.94 sec no UE (9/16), 20.68 sec no UE (10/18); 17.03 seconds (11/18) Goal status: PROGRESSING  3.  Pt will improve gait velocity to at least 3.25 ft/sec for improved gait efficiency and performance at mod I level  Baseline: 2.74 ft/sec with SBQC mod I (9/16),  Goal status: INITIAL  4.  Pt will improve normal TUG to less than or equal to 13 seconds for improved functional mobility and decreased fall risk. Baseline: 14.34 sec with SBQC (9/16) Goal status: INITIAL  5.  Pt will improve Berg score to 48/56 for decreased fall risk Baseline: 40/56 (9/16) Goal status: INITIAL  6.  Pt will be able to navigate up/down curbs and hills with LRAD at mod I level Baseline:  Goal status: INITIAL  ASSESSMENT:  CLINICAL IMPRESSION:  Emphasis of skilled PT session on assessing 5x STS and progressing HEP as appropriate. Pt is making improvements on 5x STS time, decreasing to 17.03 seconds this date but not yet meeting LTG. Progressed strengthening and balance HEP exercises as appropriate, emphasizing to pt for these exercises to be "medium" difficulty for correct intensity at home while maintaining safety. Continue POC.   OBJECTIVE IMPAIRMENTS: Abnormal gait, cardiopulmonary  status limiting activity, decreased activity tolerance, decreased balance, decreased endurance, decreased mobility, difficulty walking, decreased ROM, decreased strength, impaired perceived functional ability, impaired sensation, and pain.   ACTIVITY LIMITATIONS: carrying, lifting, bending, standing, stairs, transfers, and bed mobility  PARTICIPATION LIMITATIONS: driving, community activity, and occupation  PERSONAL FACTORS: Time since onset of injury/illness/exacerbation, Transportation, and 3+ comorbidities:    chronic systolic heart failure, hx of MI, heart failure, cardiac cath April 2023 and stent, HTN, renal disorder, CVA March 2023 are also affecting patient's functional outcome.   REHAB POTENTIAL: Good  CLINICAL DECISION MAKING: Stable/uncomplicated  EVALUATION COMPLEXITY: Low  PLAN:  PT FREQUENCY: 1x/week  PT DURATION:  10 sessions  PLANNED INTERVENTIONS: Therapeutic exercises, Therapeutic activity, Neuromuscular re-education, Balance training, Gait training, Patient/Family education, Self Care, Joint mobilization, Stair training, Vestibular training, Canalith repositioning, Visual/preceptual remediation/compensation, Orthotic/Fit training, DME instructions, Dry Needling, Electrical stimulation, Cryotherapy, Moist heat, Taping, Manual therapy, and Re-evaluation  PLAN FOR NEXT SESSION: review his prior HEP and revise as appropriate, how is HEP to work on L ankle and gastroc ROM?, check BP with compression stockings, work on curbs and work up to Teaching laboratory technician, fall recovery/ getting up from floor, Lifting activities, carrying water with various cups (mug, plastic), uneven surfaces, endurance activities, quadruped, hip mobility exercises?     Beverely Low, SPT  Peter Congo, PT, DPT, CSRS  09/14/2023, 3:37 PM

## 2023-09-21 ENCOUNTER — Ambulatory Visit: Payer: 59 | Admitting: Physical Therapy

## 2023-09-21 DIAGNOSIS — R2681 Unsteadiness on feet: Secondary | ICD-10-CM

## 2023-09-21 DIAGNOSIS — M6281 Muscle weakness (generalized): Secondary | ICD-10-CM

## 2023-09-21 DIAGNOSIS — R2689 Other abnormalities of gait and mobility: Secondary | ICD-10-CM

## 2023-09-21 DIAGNOSIS — R208 Other disturbances of skin sensation: Secondary | ICD-10-CM

## 2023-09-21 NOTE — Therapy (Cosign Needed)
OUTPATIENT PHYSICAL THERAPY NEURO TREATMENT   Patient Name: Christopher Morales MRN: [id number] DOB:09-12-1971, 52 y.o., male Today's Date: 09/22/2023   PCP: Wilfred Curtis, MD REFERRING PROVIDER: Wilfred Curtis, MD  END OF SESSION:  PT End of Session - 09/21/23 1400     Visit Number 9    Number of Visits 10    Date for PT Re-Evaluation 10/05/23   to allow for scheduling delays   Authorization Type [employer]    Authorization - Number of Visits 30    Progress Note Due on Visit 10    PT Start Time 1400    PT Stop Time 1447    PT Time Calculation (min) 47 min    Equipment Utilized During Treatment Gait belt    Activity Tolerance Patient tolerated treatment well    Behavior During Therapy WFL for tasks assessed/performed                     Past Medical History:  Diagnosis Date   CHF (congestive heart failure) (HCC)    Hypertension    Renal disorder    No past surgical history on file. Patient Active Problem List   Diagnosis Date Noted   OSA (obstructive sleep apnea) 09/24/2022   Chronic combined systolic and diastolic congestive heart failure (HCC) 09/24/2022   Nocturnal hypoxia 09/24/2022    ONSET DATE: 06/25/2023 (referral date)  REFERRING DIAG: Z86.73 (ICD-10-CM) - Personal history of transient ischemic attack (TIA), and cerebral infarction without residual deficits  THERAPY DIAG:  Muscle weakness (generalized)  Unsteadiness on feet  Other abnormalities of gait and mobility  Other disturbances of skin sensation  Rationale for Evaluation and Treatment: Rehabilitation  SUBJECTIVE:                                                                                                                                                                                             SUBJECTIVE STATEMENT:   Went down and back up the hill in his back yard with his cane. Denies falls or acute changes. GI problems improved since last visit. Pt reports an elevated liver function  test that will be getting an ultrasound for, does not believe it is related to the GI upset recently.   Pt accompanied by: self  PERTINENT HISTORY: chronic systolic heart failure, hx of MI, heart failure, cardiac cath April 2023 and stent, HTN, renal disorder, CVA March 2023  PAIN:  Are you having pain?  Yes,  "I always live with some degree of it around a 3"  PRECAUTIONS: Fall  RED FLAGS: None   WEIGHT BEARING RESTRICTIONS: No  FALLS: Has patient fallen in last 6 months? No  LIVING ENVIRONMENT: Lives with: lives with their family stays at [facility] house overnight, stays at his house during the day Lives in: House/apartment Stairs: Yes: External: 3-4 steps; on right going up 3-4 at mom's house; 5 STE at his house with one handrail for both Has following equipment at home: Quad cane small base and Walker - 2 wheeled  PLOF: Independent with gait, Independent with transfers, and Requires assistive device for independence  PATIENT GOALS: "hills, curbs, modify the exercise list from last time to see what is most helpful, want to improve my endurance"  OBJECTIVE:   DIAGNOSTIC FINDINGS: None relevant to this POC, chronic CVA  COGNITION: Overall cognitive status: Within functional limits for tasks assessed   SENSATION: N/T in BUE and BLE and even all through trunk, L>R   POSTURE: rounded shoulders, forward head, and posterior pelvic tilt  LOWER EXTREMITY ROM:     Active  Right Eval Left Eval  Hip flexion    Hip extension    Hip abduction    Hip adduction    Hip internal rotation    Hip external rotation    Knee flexion    Knee extension Tight HS Tight HS  Ankle dorsiflexion    Ankle plantarflexion  Foot tends to rest in inverted and PF position  Ankle inversion    Ankle eversion     (Blank rows = not tested)  LOWER EXTREMITY MMT:    MMT Right Eval Left Eval  Hip flexion 5 4+  Hip extension    Hip abduction    Hip adduction    Hip internal rotation    Hip  external rotation    Knee flexion 5 4  Knee extension 5 5  Ankle dorsiflexion 5 5  Ankle plantarflexion    Ankle inversion    Ankle eversion    (Blank rows = not tested)    TODAY'S TREATMENT:                                                                                                                        [employer] gait Gait pattern: step through pattern, decreased step length- Right, decreased step length- Left, decreased hip/knee flexion- Left, circumduction- Left, Right hip hike, and poor foot clearance- Left Distance walked: >1075ft Assistive device utilized:  personal 6-pt cane Level of assistance: CGA Comments: over various surfaces including tile, thresholds, rubber mats, sidewalk, inclines and declines, curbs, steep grassy hills up and down x3, gentle grassy hills up and down x3 Pt's foot supinating while ascending "big hill" 1st time, improved w/ repetitions until fatigued, then instability reappeared  PATIENT EDUCATION:  Education details: attention to foot rolling, benefits of AFO Person educated: Patient Education method: Medical illustrator Education comprehension: verbalized understanding, returned demonstration, and needs further education  HOME EXERCISE PROGRAM:  Access Code: [email or web address] (from prior POC) URL: [email or web address] Date: 07/27/2023 Prepared by: Peter Congo  Exercises - Heel Toe Raises with  Counter Support  - 1 x daily - 7 x weekly - 3 sets - 10 reps - Alternating Step Taps with Counter Support  - 1 x daily - 7 x weekly - 3 sets - 10 reps - Tandem Stance  - 1 x daily - 7 x weekly - 3 sets - 10 reps - Wide Tandem Stance with Eyes Closed  - 1 x daily - 7 x weekly - 3 sets - 10 reps - Staggered Sit-to-Stand  - 1 x daily - 7 x weekly - 3 sets - 10 reps - Ankle Inversion with Resistance  - 1 x daily - 7 x weekly - 3 sets - 10 reps - Supine Bridge  - 1 x daily - 7 x weekly - 3 sets - 10 reps - Supine Single Bent Knee  Fallout  - 1 x daily - 7 x weekly - 3 sets - 10 reps - Supine Single Knee to Chest Stretch  - 1 x daily - 7 x weekly - 2 sets - 5 reps - 30-60 sec hold - Supine Piriformis Stretch with Leg Straight  - 1 x daily - 7 x weekly - 2 sets - 5 reps - 30-60 sec hold - Seated Hamstring Stretch with Strap  - 1 x daily - 7 x weekly - 2 sets - 5 reps - 30-60 sec hold - Modified Thomas Stretch  - 1 x daily - 7 x weekly - 3 sets - 30-45 second hold - Long Sitting Calf Stretch with Strap  - 1 x daily - 7 x weekly - 1 sets - 10 reps - 30 sec hold - Gastroc Stretch on Wall  - 1 x daily - 7 x weekly - 1 sets - 10 reps - 30 sec hold - Seated Toe Flexion Extension PROM  - 1 x daily - 7 x weekly - 1 sets - 5 reps - 30-60 sec hold  GOALS: Goals reviewed with patient? Yes  SHORT TERM GOALS: Target date: 08/10/2023    Pt will be independent with initial HEP for improved strength, balance, transfers and gait.  Baseline: "I do them regularly and try to stay as active as I can" Goal status: MET  2.  Pt will improve 5 x STS to less than or equal to 16 seconds to demonstrate improved functional strength and transfer efficiency.  Baseline: 19.94 sec no UE (9/16), 20.68 sec no UE (10/18) Goal status: NOT MET  3.  Pt will improve gait velocity to at least 3.0 ft/sec for improved gait efficiency and performance at mod I level  Baseline: 2.74 ft/sec with SBQC mod I (9/16), 3.09 ft/sec (10/18) Goal status: MET   4.  Pt will improve Berg score to 44/56 for decreased fall risk Baseline: 40/56 (9/16), 43/56 Goal status: PROGRESSING  5.  Pt will initiate gait training up/down curbs and hills with LRAD Baseline: practiced 08/03/23 Goal status: MET   LONG TERM GOALS: Target date: 10/26/2023 (updated to match last day of POC)   Pt will be independent with final HEP for improved strength, balance, transfers and gait. Baseline:  Goal status: INITIAL  2.  Pt will improve 5 x STS to less than or equal to 16 seconds to  demonstrate improved functional strength and transfer efficiency.  Baseline: 19.94 sec no UE (9/16), 20.68 sec no UE (10/18); 17.03 seconds (11/18) Goal status: PROGRESSING  3.  Pt will improve gait velocity to at least 3.25 ft/sec for improved gait efficiency and performance at mod  I level  Baseline: 2.74 ft/sec with SBQC mod I (9/16),  Goal status: INITIAL  4.  Pt will improve normal TUG to less than or equal to 13 seconds for improved functional mobility and decreased fall risk. Baseline: 14.34 sec with SBQC (9/16) Goal status: INITIAL  5.  Pt will improve Berg score to 48/56 for decreased fall risk Baseline: 40/56 (9/16) Goal status: INITIAL  6.  Pt will be able to navigate up/down curbs and hills with LRAD at mod I level Baseline:  Goal status: INITIAL  ASSESSMENT:  CLINICAL IMPRESSION:  Emphasis of skilled PT session on ambulating on uneven surfaces up and down steep and gentle hills. Pt initially w/ instability of L ankle, improved with attention to task, but returning with fatigue. Continue POC.   OBJECTIVE IMPAIRMENTS: Abnormal gait, cardiopulmonary status limiting activity, decreased activity tolerance, decreased balance, decreased endurance, decreased mobility, difficulty walking, decreased ROM, decreased strength, impaired perceived functional ability, impaired sensation, and pain.   ACTIVITY LIMITATIONS: carrying, lifting, bending, standing, stairs, transfers, and bed mobility  PARTICIPATION LIMITATIONS: driving, community activity, and occupation  PERSONAL FACTORS: Time since onset of injury/illness/exacerbation, Transportation, and 3+ comorbidities:    chronic systolic heart failure, hx of MI, heart failure, cardiac cath April 2023 and stent, HTN, renal disorder, CVA March 2023 are also affecting patient's functional outcome.   REHAB POTENTIAL: Good  CLINICAL DECISION MAKING: Stable/uncomplicated  EVALUATION COMPLEXITY: Low  PLAN:  PT FREQUENCY: 1x/week  PT  DURATION:  10 sessions  PLANNED INTERVENTIONS: Therapeutic exercises, Therapeutic activity, Neuromuscular re-education, Balance training, Gait training, Patient/Family education, Self Care, Joint mobilization, Stair training, Vestibular training, Canalith repositioning, Visual/preceptual remediation/compensation, Orthotic/Fit training, DME instructions, Dry Needling, Electrical stimulation, Cryotherapy, Moist heat, Taping, Manual therapy, and Re-evaluation  PLAN FOR NEXT SESSION: 10th PN, UB strengthening, review his prior HEP and revise as appropriate, how is HEP to work on L ankle and gastroc ROM?, check BP with compression stockings, work on curbs and work up to Teaching laboratory technician, fall recovery/ getting up from floor, Lifting activities, carrying water with various cups (mug, plastic), uneven surfaces, endurance activities, quadruped, hip mobility exercises?    Beverely Low, SPT  Peter Congo, PT, DPT, CSRS  09/22/2023, 8:51 AM

## 2023-09-28 ENCOUNTER — Ambulatory Visit: Payer: 59 | Attending: Internal Medicine | Admitting: Physical Therapy

## 2023-09-28 DIAGNOSIS — M6281 Muscle weakness (generalized): Secondary | ICD-10-CM | POA: Insufficient documentation

## 2023-09-28 DIAGNOSIS — R208 Other disturbances of skin sensation: Secondary | ICD-10-CM | POA: Diagnosis present

## 2023-09-28 DIAGNOSIS — R2681 Unsteadiness on feet: Secondary | ICD-10-CM | POA: Insufficient documentation

## 2023-09-28 DIAGNOSIS — R2689 Other abnormalities of gait and mobility: Secondary | ICD-10-CM | POA: Diagnosis present

## 2023-09-28 NOTE — Addendum Note (Signed)
Addended by: Peter Congo on: 09/28/2023 03:39 PM   Modules accepted: Orders

## 2023-09-28 NOTE — Therapy (Addendum)
OUTPATIENT PHYSICAL THERAPY NEURO TREATMENT- 10th VISIT PROGRESS NOTE/ RE-CERT   Patient Name: Christopher Morales MRN: [id number] DOB:04-10-1971, 52 y.o., male Today's Date: 09/28/2023  Physical Therapy Progress Note   Dates of Reporting Period: 07/13/23- 09/28/23  See Note below for Objective Data and Assessment of Progress/Goals.  Thank you for the referral of this patient. Beverely Low, SPT   PCP: Wilfred Curtis, MD REFERRING PROVIDER: Wilfred Curtis, MD  END OF SESSION:  PT End of Session - 09/28/23 1359     Visit Number 10    Number of Visits 14   recert   Date for PT Re-Evaluation 10/27/23   to allow for scheduling delays// recert   Authorization Type [employer]    Authorization - Number of Visits 30    Progress Note Due on Visit 20    PT Start Time 1401    PT Stop Time 1448    PT Time Calculation (min) 47 min    Equipment Utilized During Treatment Gait belt    Activity Tolerance Patient tolerated treatment well;Patient limited by fatigue    Behavior During Therapy WFL for tasks assessed/performed                      Past Medical History:  Diagnosis Date   CHF (congestive heart failure) (HCC)    Hypertension    Renal disorder    No past surgical history on file. Patient Active Problem List   Diagnosis Date Noted   OSA (obstructive sleep apnea) 09/24/2022   Chronic combined systolic and diastolic congestive heart failure (HCC) 09/24/2022   Nocturnal hypoxia 09/24/2022    ONSET DATE: 06/25/2023 (referral date)  REFERRING DIAG: Z86.73 (ICD-10-CM) - Personal history of transient ischemic attack (TIA), and cerebral infarction without residual deficits  THERAPY DIAG:  Muscle weakness (generalized)  Unsteadiness on feet  Other abnormalities of gait and mobility  Other disturbances of skin sensation  Rationale for Evaluation and Treatment: Rehabilitation  SUBJECTIVE:                                                                                                                                                                                              SUBJECTIVE STATEMENT:   "The cold really bothers me, it actually slows me down". Pt reports no acute changes or falls since last seen. Pt reports uneven days- some days he does not have a lot of energy while others he is better. "I don't have days where I am totally incapacitated, but I do have days where I feel like I could do more than others."  Pt accompanied  by: self  PERTINENT HISTORY: chronic systolic heart failure, hx of MI, heart failure, cardiac cath April 2023 and stent, HTN, renal disorder, CVA March 2023  PAIN:  Are you having pain?  Yes,  "I always live with some degree of it around a 3"  PRECAUTIONS: Fall  RED FLAGS: None   WEIGHT BEARING RESTRICTIONS: No  FALLS: Has patient fallen in last 6 months? No  LIVING ENVIRONMENT: Lives with: lives with their family stays at [facility] house overnight, stays at his house during the day Lives in: House/apartment Stairs: Yes: External: 3-4 steps; on right going up 3-4 at mom's house; 5 STE at his house with one handrail for both Has following equipment at home: Quad cane small base and Walker - 2 wheeled  PLOF: Independent with gait, Independent with transfers, and Requires assistive device for independence  PATIENT GOALS: "hills, curbs, modify the exercise list from last time to see what is most helpful, want to improve my endurance"  OBJECTIVE:   DIAGNOSTIC FINDINGS: None relevant to this POC, chronic CVA  COGNITION: Overall cognitive status: Within functional limits for tasks assessed   SENSATION: N/T in BUE and BLE and even all through trunk, L>R   POSTURE: rounded shoulders, forward head, and posterior pelvic tilt  LOWER EXTREMITY ROM:     Active  Right Eval Left Eval  Hip flexion    Hip extension    Hip abduction    Hip adduction    Hip internal rotation    Hip external rotation    Knee flexion    Knee  extension Tight HS Tight HS  Ankle dorsiflexion    Ankle plantarflexion  Foot tends to rest in inverted and PF position  Ankle inversion    Ankle eversion     (Blank rows = not tested)  LOWER EXTREMITY MMT:    MMT Right Eval Left Eval  Hip flexion 5 4+  Hip extension    Hip abduction    Hip adduction    Hip internal rotation    Hip external rotation    Knee flexion 5 4  Knee extension 5 5  Ankle dorsiflexion 5 5  Ankle plantarflexion    Ankle inversion    Ankle eversion    (Blank rows = not tested)    TODAY'S TREATMENT:                                                                                                                        TherAct Floor transfer practice: intermittent CGA > Min A for safety as needed, Next to mat table w/ Red floor mat Stand to floor transfer w/ BUE support on mat table, instructed and demonstrated to sequence through stand > half kneel > tall kneel > quadruped > side sitting > long sitting Pt electing to sequence stand to tall kneel to side sitting to long sitting, frequently moving quickly and unsteadily, cued to move slower with moderate retention Using teach back method, asked pt if  he remembers what the 1st step is for fall recovery Initially pt says "to get up" With prompting, pt remembers to check for injury and call for help if needed Educated pt on when it is inappropriate to stand from a fall, pt expressed understanding 1st attempt: Asked pt to demonstrate what he remembers from fall recovery floor to stand transfer Pt transitions long sitting > tall kneeling w/ BUE support at mat table unsteadily, then uses BUEs to push to stand. Is very unsteady once standing, cued to slowly turn and sit Pt requires lengthy seated rest break after this attempt due to increased angina Discussed energy conservation and moving slowly, especially after a fall 2nd attempt: transitions quickly and unsteadily from standing back to long sitting on floor   Reviewed individual steps of floor to standing transfer and sequencing: long sit > quadruped > tall kneeling w/ BUE support on mat table > half kneel (strong leg/RLE up) w/ BUE support on mat table > push to stand > turn and sit Pt practiced slowly, broken down into individual steps, reports less chest pain at completion of this attempt 3rd attempt: transitions quickly and unsteadily from standing back to long sitting on floor  attempted w/o mat table for UE support to mimic falling outdoors Educated pt to go through same sequence as before, but instead of pushing through BUEs at a solid piece of furniture, push on R knee to stand pt unable to sustain half-kneeling w/ RUE up w/o UE support on a surface in front or to the side educated pt to call for help instead of trying to stand if he were to fall outdoors without sturdy furniture nearby as his transfers are safer with furniture and he will likely need to sit after doing a floor transfer, pt expressed understanding 4th attempt: transitions quickly and unsteadily from standing back to long sitting on floor  Pt sequences through fall recovery skipping over quadruped position, then uses R hand on RLE and L hand on Mat to stand Increased instability when standing this repetition 5th attempt: transitions quickly and unsteadily from standing back to long sitting on floor  Pt sequences through fall recovery skipping over quadruped position, then pt used plank push up to stand instead of half-kneel  Pt does not realize he did not use half-kneel position to stand, SPT explains what method pt used and how this is more energy consuming than using half-kneeling Attempts 6, 7, and 8:  Re-educated pt to check that he is hitting each individual step of floor recovery and to go slowly, if not resting in each position to conserve energy Somewhat improved stand-to-long sitting floor transfer with cues for decreased speed Pt somewhat moving through quadruped  position before achieving tall kneeling rather than fully skipping the position Improved immediate standing balance once up from the floor Pt reports: "I'm a tiny bit short of breath but not feeling like I did before", "didn't hurt at all, and I'm not too much out of breath either"  PATIENT EDUCATION:  Education details: pay attention to energy levels to determine activity, see above for floor transfer training education Person educated: Patient Education method: Explanation and Demonstration Education comprehension: verbalized understanding, returned demonstration, and needs further education  HOME EXERCISE PROGRAM:  Access Code: [email or web address] (from prior POC) URL: [email or web address] Date: 07/27/2023 Prepared by: Peter Congo  Exercises - Heel Toe Raises with Counter Support  - 1 x daily - 7 x weekly - 3 sets - 10 reps - Alternating Step Taps  with Counter Support  - 1 x daily - 7 x weekly - 3 sets - 10 reps - Tandem Stance  - 1 x daily - 7 x weekly - 3 sets - 10 reps - Wide Tandem Stance with Eyes Closed  - 1 x daily - 7 x weekly - 3 sets - 10 reps - Staggered Sit-to-Stand  - 1 x daily - 7 x weekly - 3 sets - 10 reps - Ankle Inversion with Resistance  - 1 x daily - 7 x weekly - 3 sets - 10 reps - Supine Bridge  - 1 x daily - 7 x weekly - 3 sets - 10 reps - Supine Single Bent Knee Fallout  - 1 x daily - 7 x weekly - 3 sets - 10 reps - Supine Single Knee to Chest Stretch  - 1 x daily - 7 x weekly - 2 sets - 5 reps - 30-60 sec hold - Supine Piriformis Stretch with Leg Straight  - 1 x daily - 7 x weekly - 2 sets - 5 reps - 30-60 sec hold - Seated Hamstring Stretch with Strap  - 1 x daily - 7 x weekly - 2 sets - 5 reps - 30-60 sec hold - Modified Thomas Stretch  - 1 x daily - 7 x weekly - 3 sets - 30-45 second hold - Long Sitting Calf Stretch with Strap  - 1 x daily - 7 x weekly - 1 sets - 10 reps - 30 sec hold - Gastroc Stretch on Wall  - 1 x daily - 7 x weekly - 1 sets - 10  reps - 30 sec hold - Seated Toe Flexion Extension PROM  - 1 x daily - 7 x weekly - 1 sets - 5 reps - 30-60 sec hold  GOALS: Goals reviewed with patient? Yes  SHORT TERM GOALS: Target date: 08/10/2023    Pt will be independent with initial HEP for improved strength, balance, transfers and gait.  Baseline: "I do them regularly and try to stay as active as I can" Goal status: MET  2.  Pt will improve 5 x STS to less than or equal to 16 seconds to demonstrate improved functional strength and transfer efficiency.  Baseline: 19.94 sec no UE (9/16), 20.68 sec no UE (10/18) Goal status: NOT MET  3.  Pt will improve gait velocity to at least 3.0 ft/sec for improved gait efficiency and performance at mod I level  Baseline: 2.74 ft/sec with SBQC mod I (9/16), 3.09 ft/sec (10/18) Goal status: MET   4.  Pt will improve Berg score to 44/56 for decreased fall risk Baseline: 40/56 (9/16), 43/56 Goal status: PROGRESSING  5.  Pt will initiate gait training up/down curbs and hills with LRAD Baseline: practiced 08/03/23 Goal status: MET   LONG TERM GOALS: Target date: 10/26/2023 (updated to match last day of POC)   Pt will be independent with final HEP for improved strength, balance, transfers and gait. Baseline:  Goal status: INITIAL  2.  Pt will improve 5 x STS to less than or equal to 16 seconds to demonstrate improved functional strength and transfer efficiency.  Baseline: 19.94 sec no UE (9/16), 20.68 sec no UE (10/18); 17.03 seconds (11/18) Goal status: PROGRESSING  3.  Pt will improve gait velocity to at least 3.25 ft/sec for improved gait efficiency and performance at mod I level  Baseline: 2.74 ft/sec with SBQC mod I (9/16),  Goal status: INITIAL  4.  Pt will improve normal  TUG to less than or equal to 13 seconds for improved functional mobility and decreased fall risk. Baseline: 14.34 sec with SBQC (9/16) Goal status: INITIAL  5.  Pt will improve Berg score to 48/56 for  decreased fall risk Baseline: 40/56 (9/16) Goal status: INITIAL  6.  Pt will be able to navigate up/down curbs and hills with LRAD at mod I level Baseline:  Goal status: INITIAL  ASSESSMENT:  CLINICAL IMPRESSION:  Emphasis of skilled PT session on floor transfer review and massed practice. Pt with poor recall from prior PT session on fall recovery sequencing, requiring prompting and re-education to successfully perform an energy-conserving floor transfer. Pt frequently skipping quadruped step of fall recovery sequence, and pt with more difficulty attempting to stand w/o BUE support on mat table than prior session. Reinforced education to check self for injury and call for help, especially if fallen outdoors, pt expressed understanding. Continue POC.   OBJECTIVE IMPAIRMENTS: Abnormal gait, cardiopulmonary status limiting activity, decreased activity tolerance, decreased balance, decreased endurance, decreased mobility, difficulty walking, decreased ROM, decreased strength, impaired perceived functional ability, impaired sensation, and pain.   ACTIVITY LIMITATIONS: carrying, lifting, bending, standing, stairs, transfers, and bed mobility  PARTICIPATION LIMITATIONS: driving, community activity, and occupation  PERSONAL FACTORS: Time since onset of injury/illness/exacerbation, Transportation, and 3+ comorbidities:    chronic systolic heart failure, hx of MI, heart failure, cardiac cath April 2023 and stent, HTN, renal disorder, CVA March 2023 are also affecting patient's functional outcome.   REHAB POTENTIAL: Good  CLINICAL DECISION MAKING: Stable/uncomplicated  EVALUATION COMPLEXITY: Low  PLAN:  PT FREQUENCY: 1x/week  PT DURATION:  10 sessions; +4 for recert  PLANNED INTERVENTIONS: Therapeutic exercises, Therapeutic activity, Neuromuscular re-education, Balance training, Gait training, Patient/Family education, Self Care, Joint mobilization, Stair training, Vestibular training,  Canalith repositioning, Visual/preceptual remediation/compensation, Orthotic/Fit training, DME instructions, Dry Needling, Electrical stimulation, Cryotherapy, Moist heat, Taping, Manual therapy, and Re-evaluation  PLAN FOR NEXT SESSION: UB strengthening, review his prior HEP and revise as appropriate, how is HEP to work on L ankle and gastroc ROM?, check BP with compression stockings, work on curbs and work up to Teaching laboratory technician, fall recovery/ getting up from floor, Lifting activities, carrying water with various cups (mug, plastic), uneven surfaces, endurance activities, quadruped, hip mobility exercises?  Pt interested in rollator balance changes to see if he has improved from previous use Half kneeling w/ RLE up- balance and stability, UE strengthening in this position?  Beverely Low, SPT  Peter Congo, PT, DPT, CSRS  09/28/2023, 3:27 PM

## 2023-10-05 ENCOUNTER — Ambulatory Visit: Payer: 59 | Admitting: Physical Therapy

## 2023-10-05 DIAGNOSIS — R2689 Other abnormalities of gait and mobility: Secondary | ICD-10-CM

## 2023-10-05 DIAGNOSIS — M6281 Muscle weakness (generalized): Secondary | ICD-10-CM

## 2023-10-05 DIAGNOSIS — R2681 Unsteadiness on feet: Secondary | ICD-10-CM

## 2023-10-05 NOTE — Therapy (Signed)
OUTPATIENT PHYSICAL THERAPY NEURO TREATMENT   Patient Name: Christopher Morales MRN: [id number] DOB:1971-03-07, 52 y.o., male Today's Date: 10/05/2023   PCP: Wilfred Curtis, MD REFERRING PROVIDER: Wilfred Curtis, MD  END OF SESSION:  PT End of Session - 10/05/23 1402     Visit Number 11    Number of Visits 14   recert   Date for PT Re-Evaluation 10/27/23   to allow for scheduling delays// recert   Authorization Type [employer]    Authorization - Number of Visits 30    Progress Note Due on Visit 20    PT Start Time 1400    PT Stop Time 1445    PT Time Calculation (min) 45 min    Equipment Utilized During Treatment Gait belt    Activity Tolerance Patient tolerated treatment well    Behavior During Therapy WFL for tasks assessed/performed                       Past Medical History:  Diagnosis Date   CHF (congestive heart failure) (HCC)    Hypertension    Renal disorder    No past surgical history on file. Patient Active Problem List   Diagnosis Date Noted   OSA (obstructive sleep apnea) 09/24/2022   Chronic combined systolic and diastolic congestive heart failure (HCC) 09/24/2022   Nocturnal hypoxia 09/24/2022    ONSET DATE: 06/25/2023 (referral date)  REFERRING DIAG: Z86.73 (ICD-10-CM) - Personal history of transient ischemic attack (TIA), and cerebral infarction without residual deficits  THERAPY DIAG:  Muscle weakness (generalized)  Unsteadiness on feet  Other abnormalities of gait and mobility  Rationale for Evaluation and Treatment: Rehabilitation  SUBJECTIVE:                                                                                                                                                                                             SUBJECTIVE STATEMENT:   Pt reports yesterday he got outside and was able to walk up/down the hills in his yard and his driveway, walked further than he has before. Pt has his appointment with [facility] 12/11.  Pt  reports he was so tired after last session (floor transfers) that he was unsteady even with his RW and it took him a few days to recover. Pt also reports he doesn't do as well in the cold weather.  Pt accompanied by: self  PERTINENT HISTORY: chronic systolic heart failure, hx of MI, heart failure, cardiac cath April 2023 and stent, HTN, renal disorder, CVA March 2023  PAIN:  Are you having pain?  Yes,  "I always  live with some degree of it around a 3"  PRECAUTIONS: Fall  RED FLAGS: None   WEIGHT BEARING RESTRICTIONS: No  FALLS: Has patient fallen in last 6 months? No  LIVING ENVIRONMENT: Lives with: lives with their family stays at [facility] house overnight, stays at his house during the day Lives in: House/apartment Stairs: Yes: External: 3-4 steps; on right going up 3-4 at mom's house; 5 STE at his house with one handrail for both Has following equipment at home: Quad cane small base and Walker - 2 wheeled  PLOF: Independent with gait, Independent with transfers, and Requires assistive device for independence  PATIENT GOALS: "hills, curbs, modify the exercise list from last time to see what is most helpful, want to improve my endurance"  OBJECTIVE:   DIAGNOSTIC FINDINGS: None relevant to this POC, chronic CVA  COGNITION: Overall cognitive status: Within functional limits for tasks assessed   SENSATION: N/T in BUE and BLE and even all through trunk, L>R   POSTURE: rounded shoulders, forward head, and posterior pelvic tilt  LOWER EXTREMITY ROM:     Active  Right Eval Left Eval  Hip flexion    Hip extension    Hip abduction    Hip adduction    Hip internal rotation    Hip external rotation    Knee flexion    Knee extension Tight HS Tight HS  Ankle dorsiflexion    Ankle plantarflexion  Foot tends to rest in inverted and PF position  Ankle inversion    Ankle eversion     (Blank rows = not tested)  LOWER EXTREMITY MMT:    MMT Right Eval Left Eval  Hip flexion  5 4+  Hip extension    Hip abduction    Hip adduction    Hip internal rotation    Hip external rotation    Knee flexion 5 4  Knee extension 5 5  Ankle dorsiflexion 5 5  Ankle plantarflexion    Ankle inversion    Ankle eversion    (Blank rows = not tested)    TODAY'S TREATMENT:                                                                                                                        TherAct Floor Recovery: Patient educated in floor recovery this visit using teach-back for injury assessment and sequencing of task in clinic setting.  Discussion of transfer of skills to variable scenarios outside the clinic.  Patient has most difficulty with transitioning from tall-kneel to half-kneel to standing.  Performed 3 times.  Level of Assist:  MinA.   Pt wearing L Ottobock during transfer to assess if this impacts his ability to perform the transfer independently. Pt does initially have more difficulty managing his LLE during transfer due to Ottobock AFO limiting his LLE mobility. Pt exhibits decreased assist needed as repetitions progress.  Standing 6" step-downs with LLE at bottom of stairs with B handrail support and min A for balance,  2 x 10 reps. Without use of Ottobock AFO pt exhibits decreased L ankle DF and some foot slap, improved with use of brace.  Gait GAIT: Gait pattern: decreased hip/knee flexion- Left and decreased ankle dorsiflexion- Left Distance walked: 350 ft Assistive device utilized: Walker - 4 wheeled Level of assistance: CGA Comments: trial gait again with rollator, improved gait speed and improved control as compared to previous trials with this device  RAMP:  Level of Assistance: CGA Assistive device utilized: Quad cane small base Ramp Comments: with and without Ottobock AFO, CGA when descending due to decreased control  CURB:  Level of Assistance: CGA Assistive device utilized: Environmental consultant - 2 wheeled Curb Comments: with and without Ottobock AFO,  initially with decreased control with LLE when descending but improves with practice and with use of AFO   GAIT: Gait pattern:  improved L ankle DF and limb clearance with use of AFO, improved ankle control as well Distance walked: 115 ft Assistive device utilized: Quad cane small base Level of assistance: SBA Comments: with L Ottobock AFO     PATIENT EDUCATION:  Education details: continue to recommend L Ottobock AFO, PT POC Person educated: Patient Education method: Medical illustrator Education comprehension: verbalized understanding, returned demonstration, and needs further education  HOME EXERCISE PROGRAM:  Access Code: [email or web address] (from prior POC) URL: [email or web address] Date: 07/27/2023 Prepared by: Peter Congo  Exercises - Heel Toe Raises with Counter Support  - 1 x daily - 7 x weekly - 3 sets - 10 reps - Alternating Step Taps with Counter Support  - 1 x daily - 7 x weekly - 3 sets - 10 reps - Tandem Stance  - 1 x daily - 7 x weekly - 3 sets - 10 reps - Wide Tandem Stance with Eyes Closed  - 1 x daily - 7 x weekly - 3 sets - 10 reps - Staggered Sit-to-Stand  - 1 x daily - 7 x weekly - 3 sets - 10 reps - Ankle Inversion with Resistance  - 1 x daily - 7 x weekly - 3 sets - 10 reps - Supine Bridge  - 1 x daily - 7 x weekly - 3 sets - 10 reps - Supine Single Bent Knee Fallout  - 1 x daily - 7 x weekly - 3 sets - 10 reps - Supine Single Knee to Chest Stretch  - 1 x daily - 7 x weekly - 2 sets - 5 reps - 30-60 sec hold - Supine Piriformis Stretch with Leg Straight  - 1 x daily - 7 x weekly - 2 sets - 5 reps - 30-60 sec hold - Seated Hamstring Stretch with Strap  - 1 x daily - 7 x weekly - 2 sets - 5 reps - 30-60 sec hold - Modified Thomas Stretch  - 1 x daily - 7 x weekly - 3 sets - 30-45 second hold - Long Sitting Calf Stretch with Strap  - 1 x daily - 7 x weekly - 1 sets - 10 reps - 30 sec hold - Gastroc Stretch on Wall  - 1 x daily - 7 x weekly -  1 sets - 10 reps - 30 sec hold - Seated Toe Flexion Extension PROM  - 1 x daily - 7 x weekly - 1 sets - 5 reps - 30-60 sec hold  GOALS: Goals reviewed with patient? Yes  SHORT TERM GOALS: Target date: 08/10/2023    Pt will be independent with initial HEP for improved strength, balance,  transfers and gait.  Baseline: "I do them regularly and try to stay as active as I can" Goal status: MET  2.  Pt will improve 5 x STS to less than or equal to 16 seconds to demonstrate improved functional strength and transfer efficiency.  Baseline: 19.94 sec no UE (9/16), 20.68 sec no UE (10/18) Goal status: NOT MET  3.  Pt will improve gait velocity to at least 3.0 ft/sec for improved gait efficiency and performance at mod I level  Baseline: 2.74 ft/sec with SBQC mod I (9/16), 3.09 ft/sec (10/18) Goal status: MET   4.  Pt will improve Berg score to 44/56 for decreased fall risk Baseline: 40/56 (9/16), 43/56 Goal status: PROGRESSING  5.  Pt will initiate gait training up/down curbs and hills with LRAD Baseline: practiced 08/03/23 Goal status: MET   LONG TERM GOALS: Target date: 10/26/2023 (updated to match last day of POC)   Pt will be independent with final HEP for improved strength, balance, transfers and gait. Baseline:  Goal status: INITIAL  2.  Pt will improve 5 x STS to less than or equal to 16 seconds to demonstrate improved functional strength and transfer efficiency.  Baseline: 19.94 sec no UE (9/16), 20.68 sec no UE (10/18); 17.03 seconds (11/18) Goal status: PROGRESSING  3.  Pt will improve gait velocity to at least 3.25 ft/sec for improved gait efficiency and performance at mod I level  Baseline: 2.74 ft/sec with SBQC mod I (9/16),  Goal status: INITIAL  4.  Pt will improve normal TUG to less than or equal to 13 seconds for improved functional mobility and decreased fall risk. Baseline: 14.34 sec with SBQC (9/16) Goal status: INITIAL  5.  Pt will improve Berg score to 48/56  for decreased fall risk Baseline: 40/56 (9/16) Goal status: INITIAL  6.  Pt will be able to navigate up/down curbs and hills with LRAD at mod I level Baseline:  Goal status: INITIAL  ASSESSMENT:  CLINICAL IMPRESSION:  Emphasis of skilled PT session on trialing gait again with rollator per patient requesting, working on curbs, ramps, 6" step-downs both with and without L Ottobock AFO, and working on floor recovery while wearing AFO. Pt exhibits improved balance and control with use of rollator as compared to previous attempts. Pt exhibits improved L ankle DF and L ankle control with use of AFO though he does have more difficulty with floor transfer with AFO than without AFO. Pt continues to benefit from skilled therapy services to work towards LTGs. Continue POC.   OBJECTIVE IMPAIRMENTS: Abnormal gait, cardiopulmonary status limiting activity, decreased activity tolerance, decreased balance, decreased endurance, decreased mobility, difficulty walking, decreased ROM, decreased strength, impaired perceived functional ability, impaired sensation, and pain.   ACTIVITY LIMITATIONS: carrying, lifting, bending, standing, stairs, transfers, and bed mobility  PARTICIPATION LIMITATIONS: driving, community activity, and occupation  PERSONAL FACTORS: Time since onset of injury/illness/exacerbation, Transportation, and 3+ comorbidities:    chronic systolic heart failure, hx of MI, heart failure, cardiac cath April 2023 and stent, HTN, renal disorder, CVA March 2023 are also affecting patient's functional outcome.   REHAB POTENTIAL: Good  CLINICAL DECISION MAKING: Stable/uncomplicated  EVALUATION COMPLEXITY: Low  PLAN:  PT FREQUENCY: 1x/week  PT DURATION:  10 sessions; +4 for recert  PLANNED INTERVENTIONS: Therapeutic exercises, Therapeutic activity, Neuromuscular re-education, Balance training, Gait training, Patient/Family education, Self Care, Joint mobilization, Stair training, Vestibular  training, Canalith repositioning, Visual/preceptual remediation/compensation, Orthotic/Fit training, DME instructions, Dry Needling, Electrical stimulation, Cryotherapy, Moist heat, Taping, Manual therapy,  and Re-evaluation  PLAN FOR NEXT SESSION: UB strengthening, review his prior HEP and revise as appropriate, how is HEP to work on L ankle and gastroc ROM?, check BP with compression stockings, work on curbs and work up to Teaching laboratory technician, fall recovery/ getting up from floor, Lifting activities, carrying water with various cups (mug, plastic), uneven surfaces, endurance activities, quadruped, hip mobility exercises?   Half kneeling w/ RLE up- balance and stability, UE strengthening in this position? Step downs for eccentric control, try some tall and half kneel stuff on mat to see if he could safely do this at home, walk outdoors on concrete   Peter Congo, PT, DPT, CSRS  10/05/2023, 2:48 PM

## 2023-10-12 ENCOUNTER — Ambulatory Visit: Payer: 59 | Admitting: Physical Therapy

## 2023-10-12 DIAGNOSIS — M6281 Muscle weakness (generalized): Secondary | ICD-10-CM | POA: Diagnosis not present

## 2023-10-12 DIAGNOSIS — R2681 Unsteadiness on feet: Secondary | ICD-10-CM

## 2023-10-12 DIAGNOSIS — R2689 Other abnormalities of gait and mobility: Secondary | ICD-10-CM

## 2023-10-12 NOTE — Therapy (Signed)
OUTPATIENT PHYSICAL THERAPY NEURO TREATMENT   Patient Name: Christopher Morales MRN: [id number] DOB:1971-08-01, 52 y.o., male Today's Date: 10/12/2023   PCP: Wilfred Curtis, MD REFERRING PROVIDER: Wilfred Curtis, MD  END OF SESSION:  PT End of Session - 10/12/23 1401     Visit Number 12    Number of Visits 14   recert   Date for PT Re-Evaluation 10/27/23   to allow for scheduling delays// recert   Authorization Type [employer]    Authorization - Number of Visits 30    Progress Note Due on Visit 20    PT Start Time 1400    PT Stop Time 1445    PT Time Calculation (min) 45 min    Equipment Utilized During Treatment Gait belt    Activity Tolerance Patient tolerated treatment well    Behavior During Therapy WFL for tasks assessed/performed                        Past Medical History:  Diagnosis Date   CHF (congestive heart failure) (HCC)    Hypertension    Renal disorder    No past surgical history on file. Patient Active Problem List   Diagnosis Date Noted   OSA (obstructive sleep apnea) 09/24/2022   Chronic combined systolic and diastolic congestive heart failure (HCC) 09/24/2022   Nocturnal hypoxia 09/24/2022    ONSET DATE: 06/25/2023 (referral date)  REFERRING DIAG: Z86.73 (ICD-10-CM) - Personal history of transient ischemic attack (TIA), and cerebral infarction without residual deficits  THERAPY DIAG:  Muscle weakness (generalized)  Unsteadiness on feet  Other abnormalities of gait and mobility  Rationale for Evaluation and Treatment: Rehabilitation  SUBJECTIVE:                                                                                                                                                                                             SUBJECTIVE STATEMENT:   Pt denies any acute changes since last visit, states that the cold weather really does affect him. He has still been walking up/down the hill in his backyard, definitely harder when it is  cold outside.  "I went to [facility], of course they think I need a brace" so wanting this therapist's opinion on if he needs a brace.  Pt with ongoing chronic shoulder pain since his stroke, interested in starting OT again in the new year.  Pt accompanied by: self  PERTINENT HISTORY: chronic systolic heart failure, hx of MI, heart failure, cardiac cath April 2023 and stent, HTN, renal disorder, CVA March 2023  PAIN:  Are you having pain?  Yes,  "I always live with some degree of it around a 3"  PRECAUTIONS: Fall  RED FLAGS: None   WEIGHT BEARING RESTRICTIONS: No  FALLS: Has patient fallen in last 6 months? No  LIVING ENVIRONMENT: Lives with: lives with their family stays at [facility] house overnight, stays at his house during the day Lives in: House/apartment Stairs: Yes: External: 3-4 steps; on right going up 3-4 at mom's house; 5 STE at his house with one handrail for both Has following equipment at home: Quad cane small base and Walker - 2 wheeled  PLOF: Independent with gait, Independent with transfers, and Requires assistive device for independence  PATIENT GOALS: "hills, curbs, modify the exercise list from last time to see what is most helpful, want to improve my endurance"  OBJECTIVE:   DIAGNOSTIC FINDINGS: None relevant to this POC, chronic CVA  COGNITION: Overall cognitive status: Within functional limits for tasks assessed   SENSATION: N/T in BUE and BLE and even all through trunk, L>R   POSTURE: rounded shoulders, forward head, and posterior pelvic tilt  LOWER EXTREMITY ROM:     Active  Right Eval Left Eval  Hip flexion    Hip extension    Hip abduction    Hip adduction    Hip internal rotation    Hip external rotation    Knee flexion    Knee extension Tight HS Tight HS  Ankle dorsiflexion    Ankle plantarflexion  Foot tends to rest in inverted and PF position  Ankle inversion    Ankle eversion     (Blank rows = not tested)  LOWER EXTREMITY MMT:     MMT Right Eval Left Eval  Hip flexion 5 4+  Hip extension    Hip abduction    Hip adduction    Hip internal rotation    Hip external rotation    Knee flexion 5 4  Knee extension 5 5  Ankle dorsiflexion 5 5  Ankle plantarflexion    Ankle inversion    Ankle eversion    (Blank rows = not tested)    TODAY'S TREATMENT:                                                                                                                        NMR Standing to tall kneel with bench to quadruped without bench.  In quadruped to work on functional LE strengthening and balance: Alt UE lifts 2 x 10 reps B Alt LE lifts 2 x 10 reps B With min A for balance, difficulty balancing on L side Pt needs a seated rest break in between repetitions due to fatigue  In tall kneel with bench to work on functional LE strengthening: Lateral side-stepping 3 x 5 ft each direction    PATIENT EDUCATION:  Education details: continue to recommend L Ottobock AFO, PT POC, added to HEP Person educated: Patient Education method: Medical illustrator Education comprehension: verbalized understanding, returned demonstration, and needs further education  HOME  EXERCISE PROGRAM:  Access Code: [email or web address] (from prior POC) URL: [email or web address] Date: 07/27/2023 Prepared by: Peter Congo  Exercises - Heel Toe Raises with Counter Support  - 1 x daily - 7 x weekly - 3 sets - 10 reps - Alternating Step Taps with Counter Support  - 1 x daily - 7 x weekly - 3 sets - 10 reps - Tandem Stance  - 1 x daily - 7 x weekly - 3 sets - 10 reps - Wide Tandem Stance with Eyes Closed  - 1 x daily - 7 x weekly - 3 sets - 10 reps - Staggered Sit-to-Stand  - 1 x daily - 7 x weekly - 3 sets - 10 reps - Ankle Inversion with Resistance  - 1 x daily - 7 x weekly - 3 sets - 10 reps - Supine Bridge  - 1 x daily - 7 x weekly - 3 sets - 10 reps - Supine Single Bent Knee Fallout  - 1 x daily - 7 x weekly - 3 sets  - 10 reps - Supine Single Knee to Chest Stretch  - 1 x daily - 7 x weekly - 2 sets - 5 reps - 30-60 sec hold - Supine Piriformis Stretch with Leg Straight  - 1 x daily - 7 x weekly - 2 sets - 5 reps - 30-60 sec hold - Seated Hamstring Stretch with Strap  - 1 x daily - 7 x weekly - 2 sets - 5 reps - 30-60 sec hold - Modified Thomas Stretch  - 1 x daily - 7 x weekly - 3 sets - 30-45 second hold - Long Sitting Calf Stretch with Strap  - 1 x daily - 7 x weekly - 1 sets - 10 reps - 30 sec hold - Gastroc Stretch on Wall  - 1 x daily - 7 x weekly - 1 sets - 10 reps - 30 sec hold - Seated Toe Flexion Extension PROM  - 1 x daily - 7 x weekly - 1 sets - 5 reps - 30-60 sec hold - Quadruped Alternating Arm Lift  - 1 x daily - 7 x weekly - 3 sets - 10 reps  GOALS: Goals reviewed with patient? Yes  SHORT TERM GOALS: Target date: 08/10/2023    Pt will be independent with initial HEP for improved strength, balance, transfers and gait.  Baseline: "I do them regularly and try to stay as active as I can" Goal status: MET  2.  Pt will improve 5 x STS to less than or equal to 16 seconds to demonstrate improved functional strength and transfer efficiency.  Baseline: 19.94 sec no UE (9/16), 20.68 sec no UE (10/18) Goal status: NOT MET  3.  Pt will improve gait velocity to at least 3.0 ft/sec for improved gait efficiency and performance at mod I level  Baseline: 2.74 ft/sec with SBQC mod I (9/16), 3.09 ft/sec (10/18) Goal status: MET   4.  Pt will improve Berg score to 44/56 for decreased fall risk Baseline: 40/56 (9/16), 43/56 Goal status: PROGRESSING  5.  Pt will initiate gait training up/down curbs and hills with LRAD Baseline: practiced 08/03/23 Goal status: MET   LONG TERM GOALS: Target date: 10/26/2023 (updated to match last day of POC)   Pt will be independent with final HEP for improved strength, balance, transfers and gait. Baseline:  Goal status: INITIAL  2.  Pt will improve 5 x STS to  less than or equal to 16 seconds to demonstrate improved  functional strength and transfer efficiency.  Baseline: 19.94 sec no UE (9/16), 20.68 sec no UE (10/18); 17.03 seconds (11/18) Goal status: PROGRESSING  3.  Pt will improve gait velocity to at least 3.25 ft/sec for improved gait efficiency and performance at mod I level  Baseline: 2.74 ft/sec with SBQC mod I (9/16),  Goal status: INITIAL  4.  Pt will improve normal TUG to less than or equal to 13 seconds for improved functional mobility and decreased fall risk. Baseline: 14.34 sec with SBQC (9/16) Goal status: INITIAL  5.  Pt will improve Berg score to 48/56 for decreased fall risk Baseline: 40/56 (9/16) Goal status: INITIAL  6.  Pt will be able to navigate up/down curbs and hills with LRAD at mod I level Baseline:  Goal status: INITIAL  ASSESSMENT:  CLINICAL IMPRESSION:  Emphasis of skilled PT session on discussing continued recommendation of L AFO, pt to reach out to [facility] again to schedule follow-up. Also focused on tall kneel and quadruped tasks on mat table this date to see what patient can work on at home. Pt can safely perform quadruped alt UE lifts on his bed at home, would need assist to perform LE lifts or tall-kneeling tasks. Will continue to work on reviewing/upgrading patient's HEP in future sessions. Continue POC.   OBJECTIVE IMPAIRMENTS: Abnormal gait, cardiopulmonary status limiting activity, decreased activity tolerance, decreased balance, decreased endurance, decreased mobility, difficulty walking, decreased ROM, decreased strength, impaired perceived functional ability, impaired sensation, and pain.   ACTIVITY LIMITATIONS: carrying, lifting, bending, standing, stairs, transfers, and bed mobility  PARTICIPATION LIMITATIONS: driving, community activity, and occupation  PERSONAL FACTORS: Time since onset of injury/illness/exacerbation, Transportation, and 3+ comorbidities:    chronic systolic heart failure, hx  of MI, heart failure, cardiac cath April 2023 and stent, HTN, renal disorder, CVA March 2023 are also affecting patient's functional outcome.   REHAB POTENTIAL: Good  CLINICAL DECISION MAKING: Stable/uncomplicated  EVALUATION COMPLEXITY: Low  PLAN:  PT FREQUENCY: 1x/week  PT DURATION:  10 sessions; +4 for recert  PLANNED INTERVENTIONS: Therapeutic exercises, Therapeutic activity, Neuromuscular re-education, Balance training, Gait training, Patient/Family education, Self Care, Joint mobilization, Stair training, Vestibular training, Canalith repositioning, Visual/preceptual remediation/compensation, Orthotic/Fit training, DME instructions, Dry Needling, Electrical stimulation, Cryotherapy, Moist heat, Taping, Manual therapy, and Re-evaluation  PLAN FOR NEXT SESSION: UB strengthening, review his prior HEP and revise as appropriate, how is HEP to work on L ankle and gastroc ROM?, check BP with compression stockings, work on curbs and work up to Teaching laboratory technician, fall recovery/ getting up from floor, Lifting activities, carrying water with various cups (mug, plastic), uneven surfaces, endurance activities, quadruped, hip mobility exercises?   Half kneeling w/ RLE up- balance and stability, UE strengthening in this position? Step downs for eccentric control, try some tall and half kneel stuff on mat to see if he could safely do this at home, walk outdoors on concrete  Review HEP and upgrade   Peter Congo, PT, DPT, CSRS  10/12/2023, 4:17 PM

## 2023-10-19 ENCOUNTER — Telehealth: Payer: Self-pay | Admitting: Physical Therapy

## 2023-10-19 ENCOUNTER — Ambulatory Visit: Payer: 59 | Admitting: Physical Therapy

## 2023-10-19 DIAGNOSIS — M6281 Muscle weakness (generalized): Secondary | ICD-10-CM | POA: Diagnosis not present

## 2023-10-19 DIAGNOSIS — R2689 Other abnormalities of gait and mobility: Secondary | ICD-10-CM

## 2023-10-19 DIAGNOSIS — R2681 Unsteadiness on feet: Secondary | ICD-10-CM

## 2023-10-19 NOTE — Therapy (Signed)
OUTPATIENT PHYSICAL THERAPY NEURO TREATMENT   Patient Name: Christopher Morales MRN: [id number] DOB:[birth year], 52 y.o., male Today's Date: 10/19/2023   PCP: Wilfred Curtis, MD REFERRING PROVIDER: Wilfred Curtis, MD  END OF SESSION:  PT End of Session - 10/19/23 1358     Visit Number 13    Number of Visits 14   recert   Date for PT Re-Evaluation 10/27/23   to allow for scheduling delays// recert   Authorization Type [employer]    Authorization - Number of Visits 30    Progress Note Due on Visit 20    PT Start Time 1357    PT Stop Time 1450    PT Time Calculation (min) 53 min    Equipment Utilized During Treatment Gait belt    Activity Tolerance Patient tolerated treatment well    Behavior During Therapy WFL for tasks assessed/performed                         Past Medical History:  Diagnosis Date   CHF (congestive heart failure) (HCC)    Hypertension    Renal disorder    No past surgical history on file. Patient Active Problem List   Diagnosis Date Noted   OSA (obstructive sleep apnea) 09/24/2022   Chronic combined systolic and diastolic congestive heart failure (HCC) 09/24/2022   Nocturnal hypoxia 09/24/2022    ONSET DATE: 06/25/2023 (referral date)  REFERRING DIAG: Z86.73 (ICD-10-CM) - Personal history of transient ischemic attack (TIA), and cerebral infarction without residual deficits  THERAPY DIAG:  Muscle weakness (generalized)  Unsteadiness on feet  Other abnormalities of gait and mobility  Rationale for Evaluation and Treatment: Rehabilitation  SUBJECTIVE:                                                                                                                                                                                             SUBJECTIVE STATEMENT:   Pt reports onset of leg stiffness and fatigue by the end of the day. Pt reports he is doing his HEP and walking up/down the hill behind his house every day. Pt does report increased  stiffness with colder weather like today.  Goes back to [facility] 12/27 to get his new AFO. Encouraged patient to walk outdoors and walk hill behind his house in his new AFO.  Pt did try quadruped alt UE lift exercise but difficulty performing on his bed at home  Pt accompanied by: self  PERTINENT HISTORY: chronic systolic heart failure, hx of MI, heart failure, cardiac cath April 2023 and stent, HTN, renal disorder, CVA March 2023  PAIN:  Are you having pain?  Yes,  "I always live with some degree of it around a 3"  PRECAUTIONS: Fall  RED FLAGS: None   WEIGHT BEARING RESTRICTIONS: No  FALLS: Has patient fallen in last 6 months? No  LIVING ENVIRONMENT: Lives with: lives with their family stays at [facility] house overnight, stays at his house during the day Lives in: House/apartment Stairs: Yes: External: 3-4 steps; on right going up 3-4 at mom's house; 5 STE at his house with one handrail for both Has following equipment at home: Quad cane small base and Walker - 2 wheeled  PLOF: Independent with gait, Independent with transfers, and Requires assistive device for independence  PATIENT GOALS: "hills, curbs, modify the exercise list from last time to see what is most helpful, want to improve my endurance"  OBJECTIVE:   DIAGNOSTIC FINDINGS: None relevant to this POC, chronic CVA  COGNITION: Overall cognitive status: Within functional limits for tasks assessed   SENSATION: N/T in BUE and BLE and even all through trunk, L>R   POSTURE: rounded shoulders, forward head, and posterior pelvic tilt  LOWER EXTREMITY ROM:     Active  Right Eval Left Eval  Hip flexion    Hip extension    Hip abduction    Hip adduction    Hip internal rotation    Hip external rotation    Knee flexion    Knee extension Tight HS Tight HS  Ankle dorsiflexion    Ankle plantarflexion  Foot tends to rest in inverted and PF position  Ankle inversion    Ankle eversion     (Blank rows = not  tested)  LOWER EXTREMITY MMT:    MMT Right Eval Left Eval  Hip flexion 5 4+  Hip extension    Hip abduction    Hip adduction    Hip internal rotation    Hip external rotation    Knee flexion 5 4  Knee extension 5 5  Ankle dorsiflexion 5 5  Ankle plantarflexion    Ankle inversion    Ankle eversion    (Blank rows = not tested)    TODAY'S TREATMENT:                                                                                                                         TherEx Review of HEP: See HEP below, reviewed all exercises except for bolded exercises +seated anterior leans on blue Swiss ball 5 x 30 sec each +seated anteriolateral leans on blue Swiss ball    PATIENT EDUCATION:  Education details: recommended pt bring in his new AFO next session, next session will focus on assessment of gait with new AFO, assessment of LTGs, and reviewing remaining exercises on his HEP Person educated: Patient Education method: Medical illustrator Education comprehension: verbalized understanding, returned demonstration, and needs further education  HOME EXERCISE PROGRAM:  Access Code: [email or web address] (from prior POC) URL: [email or web address] Date: 07/27/2023 Prepared by: Peter Congo  Exercises -  Heel Toe Raises with Counter Support  - 1 x daily - 7 x weekly - 3 sets - 10 reps - Alternating Step Taps with Counter Support  - 1 x daily - 7 x weekly - 3 sets - 10 reps - Tandem Stance  - 1 x daily - 7 x weekly - 3 sets - 10 reps - Wide Tandem Stance with Eyes Closed  - 1 x daily - 7 x weekly - 3 sets - 10 reps - Staggered Sit-to-Stand  - 1 x daily - 7 x weekly - 3 sets - 10 reps - Ankle Inversion with Resistance  - 1 x daily - 7 x weekly - 3 sets - 10 reps - Supine Bridge  - 1 x daily - 7 x weekly - 3 sets - 10 reps - Supine Single Bent Knee Fallout  - 1 x daily - 7 x weekly - 3 sets - 10 reps - Supine Single Knee to Chest Stretch  - 1 x daily - 7 x weekly - 2  sets - 5 reps - 30-60 sec hold - Supine Piriformis Stretch with Leg Straight  - 1 x daily - 7 x weekly - 2 sets - 5 reps - 30-60 sec hold - Seated Hamstring Stretch with Strap  - 1 x daily - 7 x weekly - 2 sets - 5 reps - 30-60 sec hold - Modified Thomas Stretch  - 1 x daily - 7 x weekly - 3 sets - 30-45 second hold - Long Sitting Calf Stretch with Strap  - 1 x daily - 7 x weekly - 1 sets - 10 reps - 30 sec hold - Gastroc Stretch on Wall  - 1 x daily - 7 x weekly - 1 sets - 10 reps - 30 sec hold - Seated Toe Flexion Extension PROM  - 1 x daily - 7 x weekly - 1 sets - 5 reps - 30-60 sec hold - Quadruped Alternating Arm Lift  - 1 x daily - 7 x weekly - 3 sets - 10 reps - Seated Flexion Stretch with Swiss Ball  - 1 x daily - 7 x weekly - 1 sets - 3-5 reps - 30 sec hold - Seated Thoracic Flexion and Rotation with Swiss Ball  - 1 x daily - 7 x weekly - 1 sets - 3-5 reps - 30 sec hold   GOALS: Goals reviewed with patient? Yes  SHORT TERM GOALS: Target date: 08/10/2023    Pt will be independent with initial HEP for improved strength, balance, transfers and gait.  Baseline: "I do them regularly and try to stay as active as I can" Goal status: MET  2.  Pt will improve 5 x STS to less than or equal to 16 seconds to demonstrate improved functional strength and transfer efficiency.  Baseline: 19.94 sec no UE (9/16), 20.68 sec no UE (10/18) Goal status: NOT MET  3.  Pt will improve gait velocity to at least 3.0 ft/sec for improved gait efficiency and performance at mod I level  Baseline: 2.74 ft/sec with SBQC mod I (9/16), 3.09 ft/sec (10/18) Goal status: MET   4.  Pt will improve Berg score to 44/56 for decreased fall risk Baseline: 40/56 (9/16), 43/56 Goal status: PROGRESSING  5.  Pt will initiate gait training up/down curbs and hills with LRAD Baseline: practiced 08/03/23 Goal status: MET   LONG TERM GOALS: Target date: 10/26/2023 (updated to match last day of POC)   Pt will be  independent  with final HEP for improved strength, balance, transfers and gait. Baseline:  Goal status: INITIAL  2.  Pt will improve 5 x STS to less than or equal to 16 seconds to demonstrate improved functional strength and transfer efficiency.  Baseline: 19.94 sec no UE (9/16), 20.68 sec no UE (10/18); 17.03 seconds (11/18) Goal status: PROGRESSING  3.  Pt will improve gait velocity to at least 3.25 ft/sec for improved gait efficiency and performance at mod I level  Baseline: 2.74 ft/sec with SBQC mod I (9/16),  Goal status: INITIAL  4.  Pt will improve normal TUG to less than or equal to 13 seconds for improved functional mobility and decreased fall risk. Baseline: 14.34 sec with SBQC (9/16) Goal status: INITIAL  5.  Pt will improve Berg score to 48/56 for decreased fall risk Baseline: 40/56 (9/16) Goal status: INITIAL  6.  Pt will be able to navigate up/down curbs and hills with LRAD at mod I level Baseline:  Goal status: INITIAL  ASSESSMENT:  CLINICAL IMPRESSION:  Emphasis of skilled PT session on reviewing HEP and adding to HEP. Discussed the difference between patient working on walking outdoors, strengthening HEP, and stretching HEP and purpose of each component. Pt with improved ability to perform transfers floor to/from mat table this session, encouraged him to perform quadruped and supine exercises on the floor as safe and able. Pt continues to benefit from skilled PT services to work towards reviewing remainder of his HEP, focusing on assessment of his gait with new AFO, and assessing his LTGs. Continue POC.   OBJECTIVE IMPAIRMENTS: Abnormal gait, cardiopulmonary status limiting activity, decreased activity tolerance, decreased balance, decreased endurance, decreased mobility, difficulty walking, decreased ROM, decreased strength, impaired perceived functional ability, impaired sensation, and pain.   ACTIVITY LIMITATIONS: carrying, lifting, bending, standing, stairs,  transfers, and bed mobility  PARTICIPATION LIMITATIONS: driving, community activity, and occupation  PERSONAL FACTORS: Time since onset of injury/illness/exacerbation, Transportation, and 3+ comorbidities:    chronic systolic heart failure, hx of MI, heart failure, cardiac cath April 2023 and stent, HTN, renal disorder, CVA March 2023 are also affecting patient's functional outcome.   REHAB POTENTIAL: Good  CLINICAL DECISION MAKING: Stable/uncomplicated  EVALUATION COMPLEXITY: Low  PLAN:  PT FREQUENCY: 1x/week  PT DURATION:  10 sessions; +4 for recert  PLANNED INTERVENTIONS: Therapeutic exercises, Therapeutic activity, Neuromuscular re-education, Balance training, Gait training, Patient/Family education, Self Care, Joint mobilization, Stair training, Vestibular training, Canalith repositioning, Visual/preceptual remediation/compensation, Orthotic/Fit training, DME instructions, Dry Needling, Electrical stimulation, Cryotherapy, Moist heat, Taping, Manual therapy, and Re-evaluation  PLAN FOR NEXT SESSION: assess gait with new AFO, Review rest of HEP and highlight important ones, assess LTG and d/c   Peter Congo, PT, DPT, CSRS  10/19/2023, 2:51 PM

## 2023-10-19 NOTE — Telephone Encounter (Signed)
Dr. Burnetta Sabin,  Mr. Christopher Morales is being seen by Physical Therapy following his CVA.  The patient would benefit from a new evaluation for Occupational Therapy to address ongoing LUE impairments following his CVA.    If you agree, please place an order in [facility] workque in [facility] or fax the order to [phone]. Thank you, Peter Congo PT, DPT, [facility] [address] [place], [place]  [address] Phone:  [phone] Fax:  [phone]  Faxed to provider 10/19/23

## 2023-10-26 ENCOUNTER — Ambulatory Visit: Payer: 59 | Admitting: Physical Therapy

## 2023-10-26 DIAGNOSIS — R2681 Unsteadiness on feet: Secondary | ICD-10-CM

## 2023-10-26 DIAGNOSIS — M6281 Muscle weakness (generalized): Secondary | ICD-10-CM

## 2023-10-26 DIAGNOSIS — R2689 Other abnormalities of gait and mobility: Secondary | ICD-10-CM

## 2023-10-26 NOTE — Therapy (Signed)
OUTPATIENT PHYSICAL THERAPY NEURO TREATMENT - DISCHARGE NOTE   Patient Name: Christopher Morales MRN: [id number] DOB:1971/03/18, 52 y.o., male Today's Date: 10/26/2023   PCP: Wilfred Curtis, MD REFERRING PROVIDER: Wilfred Curtis, MD  PHYSICAL THERAPY DISCHARGE SUMMARY  Visits from Start of Care: 14  Current functional level related to goals / functional outcomes: Mod I   Remaining deficits: Decreased LLE strength   Education / Equipment: Handout for HEP, continue to use SBQC and L AFO   Patient agrees to discharge. Patient goals were met. Patient is being discharged due to meeting the stated rehab goals.   END OF SESSION:  PT End of Session - 10/26/23 1400     Visit Number 14    Number of Visits 14   recert   Date for PT Re-Evaluation 10/27/23   to allow for scheduling delays// recert   Authorization Type [employer]    Authorization - Number of Visits 30    Progress Note Due on Visit 20    PT Start Time 1358    PT Stop Time 1445    PT Time Calculation (min) 47 min    Equipment Utilized During Treatment Gait belt    Activity Tolerance Patient tolerated treatment well    Behavior During Therapy WFL for tasks assessed/performed                          Past Medical History:  Diagnosis Date   CHF (congestive heart failure) (HCC)    Hypertension    Renal disorder    No past surgical history on file. Patient Active Problem List   Diagnosis Date Noted   OSA (obstructive sleep apnea) 09/24/2022   Chronic combined systolic and diastolic congestive heart failure (HCC) 09/24/2022   Nocturnal hypoxia 09/24/2022    ONSET DATE: 06/25/2023 (referral date)  REFERRING DIAG: Z86.73 (ICD-10-CM) - Personal history of transient ischemic attack (TIA), and cerebral infarction without residual deficits  THERAPY DIAG:  Unsteadiness on feet  Muscle weakness (generalized)  Other abnormalities of gait and mobility  Rationale for Evaluation and Treatment:  Rehabilitation  SUBJECTIVE:                                                                                                                                                                                             SUBJECTIVE STATEMENT:   Pt enters clinic wearing his new L AFO, noices limb is heavier and leg gets more tired by end of the day but also that he is able to walk faster, smoother, and has improved LLE stability. Pt has not been  able to try his brace out yet outdoors or walking up/down his hill out back.  Pt accompanied by: self  PERTINENT HISTORY: chronic systolic heart failure, hx of MI, heart failure, cardiac cath April 2023 and stent, HTN, renal disorder, CVA March 2023  PAIN:  Are you having pain?  Yes,  "I always live with some degree of it around a 3"  PRECAUTIONS: Fall  RED FLAGS: None   WEIGHT BEARING RESTRICTIONS: No  FALLS: Has patient fallen in last 6 months? No  LIVING ENVIRONMENT: Lives with: lives with their family stays at [facility] house overnight, stays at his house during the day Lives in: House/apartment Stairs: Yes: External: 3-4 steps; on right going up 3-4 at mom's house; 5 STE at his house with one handrail for both Has following equipment at home: Quad cane small base and Walker - 2 wheeled  PLOF: Independent with gait, Independent with transfers, and Requires assistive device for independence  PATIENT GOALS: "hills, curbs, modify the exercise list from last time to see what is most helpful, want to improve my endurance"  OBJECTIVE:   DIAGNOSTIC FINDINGS: None relevant to this POC, chronic CVA  COGNITION: Overall cognitive status: Within functional limits for tasks assessed   SENSATION: N/T in BUE and BLE and even all through trunk, L>R   POSTURE: rounded shoulders, forward head, and posterior pelvic tilt  LOWER EXTREMITY ROM:     Active  Right Eval Left Eval  Hip flexion    Hip extension    Hip abduction    Hip adduction    Hip  internal rotation    Hip external rotation    Knee flexion    Knee extension Tight HS Tight HS  Ankle dorsiflexion    Ankle plantarflexion  Foot tends to rest in inverted and PF position  Ankle inversion    Ankle eversion     (Blank rows = not tested)  LOWER EXTREMITY MMT:    MMT Right Eval Left Eval  Hip flexion 5 4+  Hip extension    Hip abduction    Hip adduction    Hip internal rotation    Hip external rotation    Knee flexion 5 4  Knee extension 5 5  Ankle dorsiflexion 5 5  Ankle plantarflexion    Ankle inversion    Ankle eversion    (Blank rows = not tested)    TODAY'S TREATMENT:     TherEx Reviewed remainder of HEP and highlighted exercises to focus on  TherAct For LTG assessment:  [facility] PT Assessment - 10/26/23 1430       Ambulation/Gait   Gait velocity 32.8 ft over 10.25 sec = 3.2 ft/sec      Standardized Balance Assessment   Standardized Balance Assessment Timed Up and Go Test;Five Times Sit to Stand;Berg Balance Test    Five times sit to stand comments  13.9   no UE     Berg Balance Test   Sit to Stand Able to stand without using hands and stabilize independently    Standing Unsupported Able to stand safely 2 minutes    Sitting with Back Unsupported but Feet Supported on Floor or Stool Able to sit safely and securely 2 minutes    Stand to Sit Sits safely with minimal use of hands    Transfers Able to transfer safely, minor use of hands    Standing Unsupported with Eyes Closed Able to stand 10 seconds safely    Standing Unsupported with Feet Together  Able to place feet together independently and stand 1 minute safely    From Standing, Reach Forward with Outstretched Arm Can reach forward >12 cm safely (5")    From Standing Position, Pick up Object from Floor Able to pick up shoe safely and easily    From Standing Position, Turn to Look Behind Over each Shoulder Looks behind one side only/other side shows less weight shift    Turn 360 Degrees Able to  turn 360 degrees safely one side only in 4 seconds or less    Standing Unsupported, Alternately Place Feet on Step/Stool Able to complete >2 steps/needs minimal assist    Standing Unsupported, One Foot in Front Able to plae foot ahead of the other independently and hold 30 seconds    Standing on One Leg Able to lift leg independently and hold 5-10 seconds    Total Score 48    Berg comment: 48/56, moderate fall risk      Timed Up and Go Test   TUG Normal TUG    Normal TUG (seconds) 11.47               PATIENT EDUCATION:  Education details: continue HEP, results of OM and functional implications Person educated: Patient Education method: Explanation and Demonstration Education comprehension: verbalized understanding and returned demonstration  HOME EXERCISE PROGRAM:  Access Code: [email or web address] (from prior POC) URL: [email or web address] Date: 07/27/2023 Prepared by: Peter Congo  Exercises - Heel Toe Raises with Counter Support  - 1 x daily - 7 x weekly - 3 sets - 10 reps - Alternating Step Taps with Counter Support  - 1 x daily - 7 x weekly - 3 sets - 10 reps - Tandem Stance  - 1 x daily - 7 x weekly - 3 sets - 10 reps - Wide Tandem Stance with Eyes Closed  - 1 x daily - 7 x weekly - 3 sets - 10 reps - Staggered Sit-to-Stand  - 1 x daily - 7 x weekly - 3 sets - 10 reps - Ankle Inversion with Resistance  - 1 x daily - 7 x weekly - 3 sets - 10 reps - Supine Bridge  - 1 x daily - 7 x weekly - 3 sets - 10 reps - Supine Single Bent Knee Fallout  - 1 x daily - 7 x weekly - 3 sets - 10 reps - Supine Single Knee to Chest Stretch  - 1 x daily - 7 x weekly - 2 sets - 5 reps - 30-60 sec hold - Supine Piriformis Stretch with Leg Straight  - 1 x daily - 7 x weekly - 2 sets - 5 reps - 30-60 sec hold - Seated Hamstring Stretch with Strap  - 1 x daily - 7 x weekly - 2 sets - 5 reps - 30-60 sec hold - Modified Thomas Stretch  - 1 x daily - 7 x weekly - 3 sets - 30-45 second  hold - Long Sitting Calf Stretch with Strap  - 1 x daily - 7 x weekly - 1 sets - 10 reps - 30 sec hold - Gastroc Stretch on Wall  - 1 x daily - 7 x weekly - 1 sets - 10 reps - 30 sec hold - Seated Toe Flexion Extension PROM  - 1 x daily - 7 x weekly - 1 sets - 5 reps - 30-60 sec hold - Quadruped Alternating Arm Lift  - 1 x daily - 7 x weekly - 3 sets - 10 reps -  Quadruped Alternating Leg Lift  - 1 x daily - 7 x weekly - 3 sets - 10 reps - Seated Flexion Stretch with Swiss Ball  - 1 x daily - 7 x weekly - 1 sets - 3-5 reps - 30 sec hold - Seated Thoracic Flexion and Rotation with Swiss Ball  - 1 x daily - 7 x weekly - 1 sets - 3-5 reps - 30 sec hold   GOALS: Goals reviewed with patient? Yes  SHORT TERM GOALS: Target date: 08/10/2023    Pt will be independent with initial HEP for improved strength, balance, transfers and gait.  Baseline: "I do them regularly and try to stay as active as I can" Goal status: MET  2.  Pt will improve 5 x STS to less than or equal to 16 seconds to demonstrate improved functional strength and transfer efficiency.  Baseline: 19.94 sec no UE (9/16), 20.68 sec no UE (10/18) Goal status: NOT MET  3.  Pt will improve gait velocity to at least 3.0 ft/sec for improved gait efficiency and performance at mod I level  Baseline: 2.74 ft/sec with SBQC mod I (9/16), 3.09 ft/sec (10/18) Goal status: MET   4.  Pt will improve Berg score to 44/56 for decreased fall risk Baseline: 40/56 (9/16), 43/56 Goal status: PROGRESSING  5.  Pt will initiate gait training up/down curbs and hills with LRAD Baseline: practiced 08/03/23 Goal status: MET   LONG TERM GOALS: Target date: 10/26/2023 (updated to match last day of POC)   Pt will be independent with final HEP for improved strength, balance, transfers and gait. Baseline:  Goal status: MET  2.  Pt will improve 5 x STS to less than or equal to 16 seconds to demonstrate improved functional strength and transfer  efficiency.  Baseline: 19.94 sec no UE (9/16), 20.68 sec no UE (10/18); 17.03 seconds (11/18), 13.9 sec no UE (12/30) Goal status: MET  3.  Pt will improve gait velocity to at least 3.25 ft/sec for improved gait efficiency and performance at mod I level  Baseline: 2.74 ft/sec with SBQC mod I (9/16), 3.2 ft/sec with SBCQ mod I (12/30) Goal status: MET  4.  Pt will improve normal TUG to less than or equal to 13 seconds for improved functional mobility and decreased fall risk. Baseline: 14.34 sec with SBQC (9/16), 11.47 sec with SBQC (12/30) Goal status: MET  5.  Pt will improve Berg score to 48/56 for decreased fall risk Baseline: 40/56 (9/16), 48/56 (12/30) Goal status: MET  6.  Pt will be able to navigate up/down curbs and hills with LRAD at mod I level Baseline: per pt report he is able to do this at home at mod I level Goal status: MET  ASSESSMENT:  CLINICAL IMPRESSION:  Emphasis of skilled PT session on reviewing remainder of HEP not covered last session and assessing LTG in preparation for d/c from OPPT services this date. Pt has met 6/6 LTG due to improving his functional LE strength, improving his balance, increasing his safety and independence with functional mobility, and decreasing his fall risk. Pt agreeable to d/c this date as all goals are met, can return to PT services in the future if warranted.   OBJECTIVE IMPAIRMENTS: Abnormal gait, cardiopulmonary status limiting activity, decreased activity tolerance, decreased balance, decreased endurance, decreased mobility, difficulty walking, decreased ROM, decreased strength, impaired perceived functional ability, impaired sensation, and pain.   ACTIVITY LIMITATIONS: carrying, lifting, bending, standing, stairs, transfers, and bed mobility  PARTICIPATION LIMITATIONS: driving,  community activity, and occupation  PERSONAL FACTORS: Time since onset of injury/illness/exacerbation, Transportation, and 3+ comorbidities:    chronic  systolic heart failure, hx of MI, heart failure, cardiac cath April 2023 and stent, HTN, renal disorder, CVA March 2023 are also affecting patient's functional outcome.   REHAB POTENTIAL: Good  CLINICAL DECISION MAKING: Stable/uncomplicated  EVALUATION COMPLEXITY: Low     Peter Congo, PT, DPT, CSRS  10/26/2023, 2:47 PM

## 2023-11-02 ENCOUNTER — Encounter: Payer: 59 | Admitting: Occupational Therapy

## 2023-11-13 ENCOUNTER — Ambulatory Visit: Payer: 59 | Attending: Internal Medicine | Admitting: Occupational Therapy

## 2023-11-13 ENCOUNTER — Encounter: Payer: Self-pay | Admitting: Occupational Therapy

## 2023-11-13 DIAGNOSIS — R29818 Other symptoms and signs involving the nervous system: Secondary | ICD-10-CM | POA: Diagnosis present

## 2023-11-13 DIAGNOSIS — R278 Other lack of coordination: Secondary | ICD-10-CM | POA: Diagnosis present

## 2023-11-13 DIAGNOSIS — R29898 Other symptoms and signs involving the musculoskeletal system: Secondary | ICD-10-CM | POA: Insufficient documentation

## 2023-11-13 DIAGNOSIS — M6281 Muscle weakness (generalized): Secondary | ICD-10-CM | POA: Insufficient documentation

## 2023-11-13 NOTE — Therapy (Signed)
OUTPATIENT OCCUPATIONAL THERAPY NEURO EVALUATION  Patient Name: Christopher Morales MRN: [id number] DOB:[birth year], 53 y.o., male Today's Date: 11/13/2023  PCP: Wilfred Curtis, MD  REFERRING PROVIDER: Wilfred Curtis, MD   END OF SESSION:  OT End of Session - 11/13/23 1054     Visit Number 1    Number of Visits 9    Date for OT Re-Evaluation 01/15/24    Authorization Type [employer]    Progress Note Due on Visit 9    OT Start Time 1104    OT Stop Time 1150    OT Time Calculation (min) 46 min    Activity Tolerance Patient tolerated treatment well    Behavior During Therapy WFL for tasks assessed/performed             Past Medical History:  Diagnosis Date   CHF (congestive heart failure) (HCC)    Hypertension    Renal disorder    History reviewed. No pertinent surgical history. Patient Active Problem List   Diagnosis Date Noted   OSA (obstructive sleep apnea) 09/24/2022   Chronic combined systolic and diastolic congestive heart failure (HCC) 09/24/2022   Nocturnal hypoxia 09/24/2022    ONSET DATE: 10/27/2023 (Date of referral)  REFERRING DIAG: Z86.73 (ICD-10-CM) - Personal history of transient ischemic attack (TIA), and cerebral infarction without residual deficits   THERAPY DIAG:  Muscle weakness (generalized)  Other lack of coordination  Other symptoms and signs involving the musculoskeletal system  Other symptoms and signs involving the nervous system  Rationale for Evaluation and Treatment: Rehabilitation  SUBJECTIVE:   SUBJECTIVE STATEMENT: He states his health has stabilized but he only gets so many visits a year. He is hoping to improve LUE function specifically his shoulder. Has difficulty opening car door with both UE. Learning to type again. Able to put coat on, which he could not do during last OT episode.   Pt accompanied by: self  PERTINENT HISTORY: chronic systolic heart failure, hx of MI, heart failure, cardiac cath April 2023 and stent, HTN, renal  disorder, CVA March 2023   Left upper extremity due to stroke  PRECAUTIONS: Fall  WEIGHT BEARING RESTRICTIONS: No  PAIN:  Are you having pain? Yes: NPRS scale: 3/10 Pain location: neck down Pain description: numb Aggravating factors: positioning Relieving factors: positioning  FALLS: Has patient fallen in last 6 months? No  LIVING ENVIRONMENT: Lives with: lives with their family stays at [facility] house overnight, stays at his house during the day Lives in: House/apartment Stairs: Yes: External: 3-4 steps; on right going up 3-4 at mom's house; 5 STE at his house with one handrail for both Has following equipment at home: Quad cane small base and Walker - 2 wheeled  PLOF: Independent with gait, Independent with transfers, and Requires assistive device for independence; is on disability from work but still waiting for government disability; was driving prior to stroke but not currently   PATIENT GOALS: Improve UE function  OBJECTIVE:  Note: Objective measures were completed at Evaluation unless otherwise noted.  HAND DOMINANCE: Right  ADLs: Overall ADLs: mod I  IADLs: Shopping: has groceries delivered Light housekeeping: swept floors and did yard work before stroke (currently hires out) Meal Prep: mod I with light meal prep; used to do more complex baking and cooking  Community mobility: dependent Medication management: setup due to Psychologist, prison and probation services management: mod I  MOBILITY STATUS: Independent with SPC  ACTIVITY TOLERANCE: Activity tolerance: good to fair  FUNCTIONAL OUTCOME MEASURES: Quick Dash: 45.5 %  disability with use of LUE   UPPER EXTREMITY ROM:    Active ROM Right eval Left eval  Shoulder flexion WNL 80  Shoulder abduction WNL 70  Shoulder internal rotation WNL Just behind hip  Shoulder external rotation WNL To top of head  Elbow flexion WNL WNL  Elbow extension WNL WFL  Wrist flexion WNL WFL  Wrist extension WNL WFL  Wrist pronation WNL WFL   Wrist supination WNL WFL  (Blank rows = not tested)  Composite flexion B WNL Digit opposition: R-WNL; L- impaired  UPPER EXTREMITY MMT:     MMT Right (eval) Left (eval)  Shoulder flexion WNL BFL  Shoulder abduction WNL BFL  Elbow flexion WNL WFL  Elbow extension WNL WNL  (Blank rows = not tested)  HAND FUNCTION: Grip strength: Right: 64 lbs; Left: 35 lbs  COORDINATION: 9 Hole Peg test: Right: 32 sec; Left: 59 sec  SENSATION: Paresthesias in B hands  EDEMA: no edema  MUSCLE TONE: LUE: Within functional limits  COGNITION: Overall cognitive status: Within functional limits for tasks assessed  VISION: Subjective report: no change since stroke Baseline vision:  slight left field cut even prior to stroke Visual history:  none  VISION ASSESSMENT: Not tested  PERCEPTION: WFL  PRAXIS: WFL  OBSERVATIONS: Pt utilizes LUE volitionally. Uses SPC for ambulation. No LOB.                                                                                                                            TREATMENT: N/A for this visit   PATIENT EDUCATION: Education details: OT Role and POC Person educated: Patient Education method: Explanation Education comprehension: verbalized understanding  HOME EXERCISE PROGRAM: N/A for this visit  GOALS:  SHORT TERM GOALS: Target date: 12/11/2023    Patient will demonstrate independence with initial LUE HEP. Baseline: Has HEP from 2024 but will require review Goal status: INITIAL  2.  Patient will demo improved FM coordination as evidenced by completing nine-hole peg with use of L in 48 seconds or less. Baseline: 59 seconds Goal status: INITIAL  3.  Patient will demonstrate at least 40 lbs L grip strength as needed to open jars and other containers. Baseline: Right: 64 lbs; Left: 35 lbs Goal status: INITIAL  LONG TERM GOALS: Target date: 01/15/2024  Patient will demonstrate updated LUE HEP with 25% verbal cues or less for proper  execution. Baseline: not yet initiated Goal status: INITIAL  2.  Pt will be able to type whole sermon using BUE equally.  Baseline: unable Goal status: INITIAL  3.  Patient will demonstrate at least 16% improvement with quick Dash score (reporting 29.5% disability or less) indicating improved functional use of affected extremity. Baseline: 45.5% disability with use of LUE Goal status: INITIAL  ASSESSMENT:  CLINICAL IMPRESSION: Patient is a 53 y.o. male who was seen today for occupational therapy evaluation for chronic CVA. Hx includes chronic systolic heart failure, hx of MI, heart failure, cardiac cath  April 2023 and stent, HTN, renal disorder, CVA March 2023 . Patient currently presents below baseline level of functioning demonstrating functional deficits and impairments as noted below. Pt would benefit from skilled OT services in the outpatient setting to work on impairments as noted below to help pt return to PLOF as able.    PERFORMANCE DEFICITS: in functional skills including ADLs, IADLs, coordination, dexterity, sensation, ROM, strength, pain, Fine motor control, Gross motor control, and UE functional use.   IMPAIRMENTS: are limiting patient from ADLs, IADLs, work, leisure, and social participation.   CO-MORBIDITIES: may have co-morbidities  that affects occupational performance. Patient will benefit from skilled OT to address above impairments and improve overall function.  MODIFICATION OR ASSISTANCE TO COMPLETE EVALUATION: No modification of tasks or assist necessary to complete an evaluation.  OT OCCUPATIONAL PROFILE AND HISTORY: Problem focused assessment: Including review of records relating to presenting problem.  CLINICAL DECISION MAKING: LOW - limited treatment options, no task modification necessary  REHAB POTENTIAL: Good for goals stated  EVALUATION COMPLEXITY: Low    PLAN:  OT FREQUENCY: 1x/week  OT DURATION: 8 weeks  PLANNED INTERVENTIONS: 97168 OT  Re-evaluation, 97535 self care/ADL training, 16109 therapeutic exercise, 97530 therapeutic activity, 97112 neuromuscular re-education, 97140 manual therapy, 97010 moist heat, 97032 electrical stimulation (manual), passive range of motion, functional mobility training, patient/family education, and DME and/or AE instructions  RECOMMENDED OTHER SERVICES: N/A for this visit  CONSULTED AND AGREED WITH PLAN OF CARE: Patient  PLAN FOR NEXT SESSION: review prior HEPs   Delana Meyer, OT 11/13/2023, 1:45 PM

## 2023-11-20 ENCOUNTER — Ambulatory Visit: Payer: 59 | Admitting: Occupational Therapy

## 2023-11-20 DIAGNOSIS — R278 Other lack of coordination: Secondary | ICD-10-CM

## 2023-11-20 DIAGNOSIS — M6281 Muscle weakness (generalized): Secondary | ICD-10-CM

## 2023-11-20 DIAGNOSIS — R29898 Other symptoms and signs involving the musculoskeletal system: Secondary | ICD-10-CM

## 2023-11-20 DIAGNOSIS — R29818 Other symptoms and signs involving the nervous system: Secondary | ICD-10-CM

## 2023-11-20 NOTE — Patient Instructions (Signed)

## 2023-11-20 NOTE — Therapy (Signed)
OUTPATIENT OCCUPATIONAL THERAPY NEURO TREATMENT  Patient Name: Christopher Morales MRN: [id number] DOB:[birth year], 53 y.o., male Today's Date: 11/20/2023  PCP: Wilfred Curtis, MD  REFERRING PROVIDER: Wilfred Curtis, MD   END OF SESSION:  OT End of Session - 11/20/23 1107     Visit Number 2    Number of Visits 9    Date for OT Re-Evaluation 01/15/24    Authorization Type [employer]    Progress Note Due on Visit 9    OT Start Time 1107    Activity Tolerance Patient tolerated treatment well    Behavior During Therapy [facility] for tasks assessed/performed             Past Medical History:  Diagnosis Date   CHF (congestive heart failure) (HCC)    Hypertension    Renal disorder    No past surgical history on file. Patient Active Problem List   Diagnosis Date Noted   OSA (obstructive sleep apnea) 09/24/2022   Chronic combined systolic and diastolic congestive heart failure (HCC) 09/24/2022   Nocturnal hypoxia 09/24/2022    ONSET DATE: 10/27/2023 (Date of referral)  REFERRING DIAG: Z86.73 (ICD-10-CM) - Personal history of transient ischemic attack (TIA), and cerebral infarction without residual deficits   THERAPY DIAG:  Muscle weakness (generalized)  Other lack of coordination  Other symptoms and signs involving the musculoskeletal system  Other symptoms and signs involving the nervous system  Rationale for Evaluation and Treatment: Rehabilitation  SUBJECTIVE:   SUBJECTIVE STATEMENT: He states he has an issue with upper body strength and cannot lift much more than 5 lbs. He has issue opening and closing a car door because of this.   Pt accompanied by: self  PERTINENT HISTORY: chronic systolic heart failure, hx of MI, heart failure, cardiac cath April 2023 and stent, HTN, renal disorder, CVA March 2023   Left upper extremity due to stroke  PRECAUTIONS: Fall  WEIGHT BEARING RESTRICTIONS: No  PAIN:  Are you having pain? Yes: NPRS scale: 3/10 Pain location: neck down Pain  description: numb Aggravating factors: positioning Relieving factors: positioning  FALLS: Has patient fallen in last 6 months? No  LIVING ENVIRONMENT: Lives with: lives with their family stays at [facility] house overnight, stays at his house during the day Lives in: House/apartment Stairs: Yes: External: 3-4 steps; on right going up 3-4 at mom's house; 5 STE at his house with one handrail for both Has following equipment at home: Quad cane small base and Walker - 2 wheeled  PLOF: Independent with gait, Independent with transfers, and Requires assistive device for independence; is on disability from work but still waiting for government disability; was driving prior to stroke but not currently   PATIENT GOALS: Improve UE function  OBJECTIVE:  Note: Objective measures were completed at Evaluation unless otherwise noted.  HAND DOMINANCE: Right  ADLs: Overall ADLs: mod I  IADLs: Shopping: has groceries delivered Light housekeeping: swept floors and did yard work before stroke (currently hires out) Meal Prep: mod I with light meal prep; used to do more complex baking and cooking  Community mobility: dependent Medication management: setup due to Psychologist, prison and probation services management: mod I  MOBILITY STATUS: Independent with SPC  ACTIVITY TOLERANCE: Activity tolerance: good to fair  FUNCTIONAL OUTCOME MEASURES: Quick Dash: 45.5 % disability with use of LUE   UPPER EXTREMITY ROM:    Active ROM Right eval Left eval  Shoulder flexion WNL 80  Shoulder abduction WNL 70  Shoulder internal rotation WNL Just behind hip  Shoulder external rotation WNL To top of head  Elbow flexion WNL WNL  Elbow extension WNL WFL  Wrist flexion WNL WFL  Wrist extension WNL WFL  Wrist pronation WNL WFL  Wrist supination WNL WFL  (Blank rows = not tested)  Composite flexion B WNL Digit opposition: R-WNL; L- impaired  UPPER EXTREMITY MMT:     MMT Right (eval) Left (eval)  Shoulder flexion WNL  BFL  Shoulder abduction WNL BFL  Elbow flexion WNL WFL  Elbow extension WNL WNL  (Blank rows = not tested)  HAND FUNCTION: Grip strength: Right: 64 lbs; Left: 35 lbs  COORDINATION: 9 Hole Peg test: Right: 32 sec; Left: 59 sec  SENSATION: Paresthesias in B hands  EDEMA: no edema  MUSCLE TONE: LUE: Within functional limits  COGNITION: Overall cognitive status: Within functional limits for tasks assessed  VISION: Subjective report: no change since stroke Baseline vision:  slight left field cut even prior to stroke Visual history:  none  VISION ASSESSMENT: Not tested  PERCEPTION: WFL  PRAXIS: WFL  OBSERVATIONS: Pt utilizes LUE volitionally. Uses SPC for ambulation. No LOB.                                                                                                                            TREATMENT:   OT reviewed prior LUE HEP condensing down to half of the original exercises to promote more consistent carryover.   OT educated pt on use of heat as noted in pt instructions to improve L shoulder stiffness and improve stretch prior to HEP completion.   PATIENT EDUCATION: Education details: LUE HEP Person educated: Patient Education method: Solicitor, Verbal cues, and Handouts Education comprehension: verbalized understanding, returned demonstration, verbal cues required, and needs further education  HOME EXERCISE PROGRAM: N/A for this visit  GOALS:  SHORT TERM GOALS: Target date: 12/11/2023    Patient will demonstrate independence with initial LUE HEP. Baseline: Has HEP from 2024 but will require review Goal status: INITIAL  2.  Patient will demo improved FM coordination as evidenced by completing nine-hole peg with use of L in 48 seconds or less. Baseline: 59 seconds Goal status: INITIAL  3.  Patient will demonstrate at least 40 lbs L grip strength as needed to open jars and other containers. Baseline: Right: 64 lbs; Left: 35 lbs Goal  status: INITIAL  LONG TERM GOALS: Target date: 01/15/2024  Patient will demonstrate updated LUE HEP with 25% verbal cues or less for proper execution. Baseline: not yet initiated Goal status: INITIAL  2.  Pt will be able to type whole sermon using BUE equally.  Baseline: unable Goal status: INITIAL  3.  Patient will demonstrate at least 16% improvement with quick Dash score (reporting 29.5% disability or less) indicating improved functional use of affected extremity. Baseline: 45.5% disability with use of LUE Goal status: INITIAL  ASSESSMENT:  CLINICAL IMPRESSION: Patient demonstrates good understanding of HEP and therapy process this date as needed to  progress towards goals.   PERFORMANCE DEFICITS: in functional skills including ADLs, IADLs, coordination, dexterity, sensation, ROM, strength, pain, Fine motor control, Gross motor control, and UE functional use.   IMPAIRMENTS: are limiting patient from ADLs, IADLs, work, leisure, and social participation.   CO-MORBIDITIES: may have co-morbidities  that affects occupational performance. Patient will benefit from skilled OT to address above impairments and improve overall function.  REHAB POTENTIAL: Good for goals stated  PLAN:  OT FREQUENCY: 1x/week  OT DURATION: 8 weeks  PLANNED INTERVENTIONS: 97168 OT Re-evaluation, 97535 self care/ADL training, 16010 therapeutic exercise, 97530 therapeutic activity, 97112 neuromuscular re-education, 97140 manual therapy, 97010 moist heat, 97032 electrical stimulation (manual), passive range of motion, functional mobility training, patient/family education, and DME and/or AE instructions  RECOMMENDED OTHER SERVICES: N/A for this visit  CONSULTED AND AGREED WITH PLAN OF CARE: Patient  PLAN FOR NEXT SESSION: Coordination HEP   Birdie Sons [place], OT 11/20/2023, 11:08 AM

## 2023-12-04 ENCOUNTER — Ambulatory Visit: Payer: 59 | Attending: Internal Medicine | Admitting: Occupational Therapy

## 2023-12-04 DIAGNOSIS — M6281 Muscle weakness (generalized): Secondary | ICD-10-CM | POA: Insufficient documentation

## 2023-12-04 DIAGNOSIS — R29818 Other symptoms and signs involving the nervous system: Secondary | ICD-10-CM | POA: Diagnosis present

## 2023-12-04 DIAGNOSIS — M25612 Stiffness of left shoulder, not elsewhere classified: Secondary | ICD-10-CM | POA: Diagnosis present

## 2023-12-04 DIAGNOSIS — M25512 Pain in left shoulder: Secondary | ICD-10-CM | POA: Diagnosis present

## 2023-12-04 DIAGNOSIS — R278 Other lack of coordination: Secondary | ICD-10-CM | POA: Insufficient documentation

## 2023-12-04 DIAGNOSIS — R208 Other disturbances of skin sensation: Secondary | ICD-10-CM | POA: Insufficient documentation

## 2023-12-04 DIAGNOSIS — G8929 Other chronic pain: Secondary | ICD-10-CM | POA: Insufficient documentation

## 2023-12-04 DIAGNOSIS — R29898 Other symptoms and signs involving the musculoskeletal system: Secondary | ICD-10-CM | POA: Diagnosis present

## 2023-12-04 NOTE — Therapy (Signed)
 OUTPATIENT OCCUPATIONAL THERAPY NEURO TREATMENT  Patient Name: Christopher Morales MRN: [id number] DOB:[birth year], 53 y.o., male Today's Date: 12/04/2023  PCP: Fleeta Silvano NOVAK, MD  REFERRING PROVIDER: Fleeta Silvano NOVAK, MD   END OF SESSION:  OT End of Session - 12/04/23 1157     Visit Number 3    Number of Visits 9    Date for OT Re-Evaluation 01/15/24    Authorization Type [employer]    Progress Note Due on Visit 9    OT Start Time 1157    OT Stop Time 1235    OT Time Calculation (min) 38 min    Activity Tolerance Patient tolerated treatment well    Behavior During Therapy WFL for tasks assessed/performed             Past Medical History:  Diagnosis Date   CHF (congestive heart failure) (HCC)    Hypertension    Renal disorder    No past surgical history on file. Patient Active Problem List   Diagnosis Date Noted   OSA (obstructive sleep apnea) 09/24/2022   Chronic combined systolic and diastolic congestive heart failure (HCC) 09/24/2022   Nocturnal hypoxia 09/24/2022    ONSET DATE: 10/27/2023 (Date of referral)  REFERRING DIAG: Z86.73 (ICD-10-CM) - Personal history of transient ischemic attack (TIA), and cerebral infarction without residual deficits   THERAPY DIAG:  Muscle weakness (generalized)  Other lack of coordination  Other symptoms and signs involving the musculoskeletal system  Other symptoms and signs involving the nervous system  Other disturbances of skin sensation  Stiffness of left shoulder, not elsewhere classified  Chronic left shoulder pain  Rationale for Evaluation and Treatment: Rehabilitation  SUBJECTIVE:   SUBJECTIVE STATEMENT: He states he had considerable soreness after completing his exercises.   Pt accompanied by: self  PERTINENT HISTORY: chronic systolic heart failure, hx of MI, heart failure, cardiac cath April 2023 and stent, HTN, renal disorder, CVA March 2023   Left upper extremity due to stroke  PRECAUTIONS: Fall  WEIGHT BEARING  RESTRICTIONS: No  PAIN:  Are you having pain? Yes: NPRS scale: 3/10 Pain location: neck down Pain description: numb Aggravating factors: positioning Relieving factors: positioning  FALLS: Has patient fallen in last 6 months? No  LIVING ENVIRONMENT: Lives with: lives with their family stays at [facility] house overnight, stays at his house during the day Lives in: House/apartment Stairs: Yes: External: 3-4 steps; on right going up 3-4 at mom's house; 5 STE at his house with one handrail for both Has following equipment at home: Quad cane small base and Walker - 2 wheeled  PLOF: Independent with gait, Independent with transfers, and Requires assistive device for independence; is on disability from work but still waiting for government disability; was driving prior to stroke but not currently   PATIENT GOALS: Improve UE function  OBJECTIVE:  Note: Objective measures were completed at Evaluation unless otherwise noted.  HAND DOMINANCE: Right  ADLs: Overall ADLs: mod I  IADLs: Shopping: has groceries delivered Light housekeeping: swept floors and did yard work before stroke (currently hires out) Meal Prep: mod I with light meal prep; used to do more complex baking and cooking  Community mobility: dependent Medication management: setup due to Psychologist, Prison And Probation Services management: mod I  MOBILITY STATUS: Independent with SPC  ACTIVITY TOLERANCE: Activity tolerance: good to fair  FUNCTIONAL OUTCOME MEASURES: Quick Dash: 45.5 % disability with use of LUE   UPPER EXTREMITY ROM:    Active ROM Right eval Left eval  Shoulder flexion  WNL 80  Shoulder abduction WNL 70  Shoulder internal rotation WNL Just behind hip  Shoulder external rotation WNL To top of head  Elbow flexion WNL WNL  Elbow extension WNL WFL  Wrist flexion WNL WFL  Wrist extension WNL WFL  Wrist pronation WNL WFL  Wrist supination WNL WFL  (Blank rows = not tested)  Composite flexion B WNL Digit opposition:  R-WNL; L- impaired  UPPER EXTREMITY MMT:     MMT Right (eval) Left (eval)  Shoulder flexion WNL BFL  Shoulder abduction WNL BFL  Elbow flexion WNL WFL  Elbow extension WNL WNL  (Blank rows = not tested)  HAND FUNCTION: Grip strength: Right: 64 lbs; Left: 35 lbs  COORDINATION: 9 Hole Peg test: Right: 32 sec; Left: 59 sec  SENSATION: Paresthesias in B hands  EDEMA: no edema  MUSCLE TONE: LUE: Within functional limits  COGNITION: Overall cognitive status: Within functional limits for tasks assessed  VISION: Subjective report: no change since stroke Baseline vision:  slight left field cut even prior to stroke Visual history:  none  VISION ASSESSMENT: Not tested  PERCEPTION: WFL  PRAXIS: WFL  OBSERVATIONS: Pt utilizes LUE volitionally. Uses SPC for ambulation. No LOB.                                                                                                                            TREATMENT:   OT initiated LUE coordination HOME EXERCISE PROGRAM as noted in patient instructions including pick up and placement of items, shuffling and turning cards, item stacking and unstacking, rolling a piece of tissue paper into a ball, rolling golf balls in hand, rolling a pen in between fingers and thumb, picking up and storing items in hand, and then translating stored items to tips of thumb and index finger before placing them individually into a container. Patient able to return demonstration of all exercises. Handout provided.  PATIENT EDUCATION: Education details: LUE coordination HEP Person educated: Patient Education method: Explanation, Demonstration, Verbal cues, and Handouts Education comprehension: verbalized understanding, returned demonstration, verbal cues required, and needs further education  HOME EXERCISE PROGRAM: 12/04/2023: coordination HEP  GOALS:  SHORT TERM GOALS: Target date: 12/11/2023    Patient will demonstrate independence with initial LUE  HEP. Baseline: Has HEP from 2024 but will require review Goal status: INITIAL  2.  Patient will demo improved FM coordination as evidenced by completing nine-hole peg with use of L in 48 seconds or less. Baseline: 59 seconds Goal status: INITIAL  3.  Patient will demonstrate at least 40 lbs L grip strength as needed to open jars and other containers. Baseline: Right: 64 lbs; Left: 35 lbs Goal status: INITIAL  LONG TERM GOALS: Target date: 01/15/2024  Patient will demonstrate updated LUE HEP with 25% verbal cues or less for proper execution. Baseline: not yet initiated Goal status: INITIAL  2.  Pt will be able to type whole sermon using BUE equally.  Baseline: unable Goal status:  INITIAL  3.  Patient will demonstrate at least 16% improvement with quick Dash score (reporting 29.5% disability or less) indicating improved functional use of affected extremity. Baseline: 45.5% disability with use of LUE Goal status: INITIAL  ASSESSMENT:  CLINICAL IMPRESSION: Patient demonstrates good understanding of HEP as needed to progress towards goals. He would benefit from completion of functional tasks in clinic to increase incorporation of LUE.   PERFORMANCE DEFICITS: in functional skills including ADLs, IADLs, coordination, dexterity, sensation, ROM, strength, pain, Fine motor control, Gross motor control, and UE functional use.   IMPAIRMENTS: are limiting patient from ADLs, IADLs, work, leisure, and social participation.   CO-MORBIDITIES: may have co-morbidities  that affects occupational performance. Patient will benefit from skilled OT to address above impairments and improve overall function.  REHAB POTENTIAL: Good for goals stated  PLAN:  OT FREQUENCY: 1x/week  OT DURATION: 8 weeks  PLANNED INTERVENTIONS: 97168 OT Re-evaluation, 97535 self care/ADL training, 02889 therapeutic exercise, 97530 therapeutic activity, 97112 neuromuscular re-education, 97140 manual therapy, 97010 moist  heat, 97032 electrical stimulation (manual), passive range of motion, functional mobility training, patient/family education, and DME and/or AE instructions  RECOMMENDED OTHER SERVICES: N/A for this visit  CONSULTED AND AGREED WITH PLAN OF CARE: Patient  PLAN FOR NEXT SESSION: review Coordination HEP; practice functional tasks with incorporation of LUE   Jocelyn CHRISTELLA Bottom, OT 12/04/2023, 12:02 PM

## 2023-12-09 ENCOUNTER — Encounter: Payer: Self-pay | Admitting: Physical Therapy

## 2023-12-10 NOTE — Therapy (Unsigned)
OUTPATIENT OCCUPATIONAL THERAPY NEURO TREATMENT  Patient Name: Christopher Morales MRN: [id number] DOB:[birth year], 53 y.o., male Today's Date: 12/11/2023  PCP: Wilfred Curtis, MD  REFERRING PROVIDER: Wilfred Curtis, MD   END OF SESSION:  OT End of Session - 12/11/23 1022     Visit Number 4    Number of Visits 9    Date for OT Re-Evaluation 01/15/24    Authorization Type [employer]    Progress Note Due on Visit 9    OT Start Time 1022    OT Stop Time 1100    OT Time Calculation (min) 38 min    Activity Tolerance Patient tolerated treatment well    Behavior During Therapy WFL for tasks assessed/performed            Past Medical History:  Diagnosis Date   CHF (congestive heart failure) (HCC)    Hypertension    Renal disorder    No past surgical history on file. Patient Active Problem List   Diagnosis Date Noted   OSA (obstructive sleep apnea) 09/24/2022   Chronic combined systolic and diastolic congestive heart failure (HCC) 09/24/2022   Nocturnal hypoxia 09/24/2022    ONSET DATE: 10/27/2023 (Date of referral)  REFERRING DIAG: Z86.73 (ICD-10-CM) - Personal history of transient ischemic attack (TIA), and cerebral infarction without residual deficits   THERAPY DIAG:  Muscle weakness (generalized)  Other lack of coordination  Other symptoms and signs involving the musculoskeletal system  Other symptoms and signs involving the nervous system  Other disturbances of skin sensation  Stiffness of left shoulder, not elsewhere classified  Rationale for Evaluation and Treatment: Rehabilitation  SUBJECTIVE:   SUBJECTIVE STATEMENT: He states he has been completing his HEP.   Pt accompanied by: self  PERTINENT HISTORY: chronic systolic heart failure, hx of MI, heart failure, cardiac cath April 2023 and stent, HTN, renal disorder, CVA March 2023   Left upper extremity due to stroke  PRECAUTIONS: Fall  WEIGHT BEARING RESTRICTIONS: No  PAIN:  Are you having pain? Yes: NPRS  scale: 3/10 Pain location: neck down Pain description: numb Aggravating factors: positioning Relieving factors: positioning  FALLS: Has patient fallen in last 6 months? No  LIVING ENVIRONMENT: Lives with: lives with their family stays at [facility] house overnight, stays at his house during the day Lives in: House/apartment Stairs: Yes: External: 3-4 steps; on right going up 3-4 at mom's house; 5 STE at his house with one handrail for both Has following equipment at home: Quad cane small base and Walker - 2 wheeled  PLOF: Independent with gait, Independent with transfers, and Requires assistive device for independence; is on disability from work but still waiting for government disability; was driving prior to stroke but not currently   PATIENT GOALS: Improve UE function  OBJECTIVE:  Note: Objective measures were completed at Evaluation unless otherwise noted.  HAND DOMINANCE: Right  ADLs: Overall ADLs: mod I  IADLs: Shopping: has groceries delivered Light housekeeping: swept floors and did yard work before stroke (currently hires out) Meal Prep: mod I with light meal prep; used to do more complex baking and cooking  Community mobility: dependent Medication management: setup due to Psychologist, prison and probation services management: mod I  MOBILITY STATUS: Independent with SPC  ACTIVITY TOLERANCE: Activity tolerance: good to fair  FUNCTIONAL OUTCOME MEASURES: Quick Dash: 45.5 % disability with use of LUE   UPPER EXTREMITY ROM:    Active ROM Right eval Left eval  Shoulder flexion WNL 80  Shoulder abduction WNL 70  Shoulder internal rotation WNL Just behind hip  Shoulder external rotation WNL To top of head  Elbow flexion WNL WNL  Elbow extension WNL WFL  Wrist flexion WNL WFL  Wrist extension WNL WFL  Wrist pronation WNL WFL  Wrist supination WNL WFL  (Blank rows = not tested)  Composite flexion B WNL Digit opposition: R-WNL; L- impaired  UPPER EXTREMITY MMT:     MMT  Right (eval) Left (eval)  Shoulder flexion WNL BFL  Shoulder abduction WNL BFL  Elbow flexion WNL WFL  Elbow extension WNL WNL  (Blank rows = not tested)  HAND FUNCTION: Grip strength: Right: 64 lbs; Left: 35 lbs  COORDINATION: 9 Hole Peg test: Right: 32 sec; Left: 59 sec  SENSATION: Paresthesias in B hands  EDEMA: no edema  MUSCLE TONE: LUE: Within functional limits  COGNITION: Overall cognitive status: Within functional limits for tasks assessed  VISION: Subjective report: no change since stroke Baseline vision:  slight left field cut even prior to stroke Visual history:  none  VISION ASSESSMENT: Not tested  PERCEPTION: WFL  PRAXIS: WFL  OBSERVATIONS: Pt utilizes LUE volitionally. Uses SPC for ambulation. No LOB.                                                                                                                            TREATMENT:   OT reviewed LUE coordination HOME EXERCISE PROGRAM as noted in patient instructions. Pt required min cueing for proper execution.   OT educated pt on table top play of [employer] for LUE ROM, coordination, visual processing, scanning, and sequencing. Pt required moderate cues for proper play.   PATIENT EDUCATION: Education details: LUE coordination HEP Person educated: Patient Education method: Explanation, Demonstration, Verbal cues, and Handouts Education comprehension: verbalized understanding, returned demonstration, verbal cues required, and needs further education  HOME EXERCISE PROGRAM: 12/04/2023: coordination HEP  GOALS:  SHORT TERM GOALS: Target date: 12/11/2023    Patient will demonstrate independence with initial LUE HEP. Baseline: Has HEP from 2024 but will require review Goal status: INITIAL  2.  Patient will demo improved FM coordination as evidenced by completing nine-hole peg with use of L in 48 seconds or less. Baseline: 59 seconds Goal status: INITIAL  3.  Patient will demonstrate at  least 40 lbs L grip strength as needed to open jars and other containers. Baseline: Right: 64 lbs; Left: 35 lbs Goal status: INITIAL  LONG TERM GOALS: Target date: 01/15/2024  Patient will demonstrate updated LUE HEP with 25% verbal cues or less for proper execution. Baseline: not yet initiated Goal status: INITIAL  2.  Pt will be able to type whole sermon using BUE equally.  Baseline: unable Goal status: INITIAL  3.  Patient will demonstrate at least 16% improvement with quick Dash score (reporting 29.5% disability or less) indicating improved functional use of affected extremity. Baseline: 45.5% disability with use of LUE Goal status: INITIAL  ASSESSMENT:  CLINICAL IMPRESSION: Patient progressing towards  goals as expected though requires increased time and cueing intermittently throughout session for proper completion of activities.    PERFORMANCE DEFICITS: in functional skills including ADLs, IADLs, coordination, dexterity, sensation, ROM, strength, pain, Fine motor control, Gross motor control, and UE functional use.   IMPAIRMENTS: are limiting patient from ADLs, IADLs, work, leisure, and social participation.   CO-MORBIDITIES: may have co-morbidities  that affects occupational performance. Patient will benefit from skilled OT to address above impairments and improve overall function.  REHAB POTENTIAL: Good for goals stated  PLAN:  OT FREQUENCY: 1x/week  OT DURATION: 8 weeks  PLANNED INTERVENTIONS: 97168 OT Re-evaluation, 97535 self care/ADL training, 19147 therapeutic exercise, 97530 therapeutic activity, 97112 neuromuscular re-education, 97140 manual therapy, 97010 moist heat, 97032 electrical stimulation (manual), passive range of motion, functional mobility training, patient/family education, and DME and/or AE instructions  RECOMMENDED OTHER SERVICES: N/A for this visit  CONSULTED AND AGREED WITH PLAN OF CARE: Patient  PLAN FOR NEXT SESSION: review Coordination HEP;  practice functional tasks with incorporation of LUE   Delana Meyer, OT 12/11/2023, 10:23 AM

## 2023-12-11 ENCOUNTER — Ambulatory Visit: Payer: 59 | Admitting: Occupational Therapy

## 2023-12-11 DIAGNOSIS — M6281 Muscle weakness (generalized): Secondary | ICD-10-CM

## 2023-12-11 DIAGNOSIS — R278 Other lack of coordination: Secondary | ICD-10-CM

## 2023-12-11 DIAGNOSIS — R208 Other disturbances of skin sensation: Secondary | ICD-10-CM

## 2023-12-11 DIAGNOSIS — M25612 Stiffness of left shoulder, not elsewhere classified: Secondary | ICD-10-CM

## 2023-12-11 DIAGNOSIS — R29898 Other symptoms and signs involving the musculoskeletal system: Secondary | ICD-10-CM

## 2023-12-11 DIAGNOSIS — R29818 Other symptoms and signs involving the nervous system: Secondary | ICD-10-CM

## 2023-12-18 ENCOUNTER — Ambulatory Visit: Payer: 59 | Admitting: Occupational Therapy

## 2023-12-21 ENCOUNTER — Encounter: Payer: Self-pay | Admitting: Cardiovascular Disease

## 2023-12-21 ENCOUNTER — Ambulatory Visit: Payer: 59 | Attending: Cardiovascular Disease | Admitting: Cardiovascular Disease

## 2023-12-21 VITALS — BP 82/49 | HR 57 | Ht 65.0 in | Wt 144.8 lb

## 2023-12-21 DIAGNOSIS — I5042 Chronic combined systolic (congestive) and diastolic (congestive) heart failure: Secondary | ICD-10-CM

## 2023-12-21 DIAGNOSIS — I255 Ischemic cardiomyopathy: Secondary | ICD-10-CM

## 2023-12-21 DIAGNOSIS — I351 Nonrheumatic aortic (valve) insufficiency: Secondary | ICD-10-CM | POA: Diagnosis not present

## 2023-12-21 DIAGNOSIS — I361 Nonrheumatic tricuspid (valve) insufficiency: Secondary | ICD-10-CM

## 2023-12-21 DIAGNOSIS — I358 Other nonrheumatic aortic valve disorders: Secondary | ICD-10-CM

## 2023-12-21 DIAGNOSIS — G4733 Obstructive sleep apnea (adult) (pediatric): Secondary | ICD-10-CM | POA: Diagnosis not present

## 2023-12-21 NOTE — Patient Instructions (Signed)
 Medication Instructions:  No medication changes were made during today's visit  *If you need a refill on your cardiac medications before your next appointment, please call your pharmacy*   Lab Work: No labs were ordered during today's visit.  If you have labs (blood work) drawn today and your tests are completely normal, you will receive your results only by: MyChart Message (if you have MyChart) OR A paper copy in the mail If you have any lab test that is abnormal or we need to change your treatment, we will call you to review the results.   Testing/Procedures: No procedures ordered today.    Follow-Up: At [facility], you and your health needs are our priority.  As part of our continuing mission to provide you with exceptional heart care, we have created designated Provider Care Teams.  These Care Teams include your primary Cardiologist (physician) and Advanced Practice Providers (APPs -  Physician Assistants and Nurse Practitioners) who all work together to provide you with the care you need, when you need it.  We recommend signing up for the patient portal called "MyChart".  Sign up information is provided on this After Visit Summary.  MyChart is used to connect with patients for Virtual Visits (Telemedicine).  Patients are able to view lab/test results, encounter notes, upcoming appointments, etc.  Non-urgent messages can be sent to your provider as well.   To learn more about what you can do with MyChart, go to [email or web address].    Your next appointment:   1 year(s)  Provider:   DR. Armanda Magic     Other Instructions        1st Floor: - Lobby - Registration  - Pharmacy  - Lab - Cafe   2nd Floor: - PV Lab - Diagnostic Testing (echo, CT, nuclear med)   3rd Floor: - Vacant   4th Floor: - TCTS (cardiothoracic surgery) - AFib Clinic - Structural Heart Clinic - Vascular Surgery  - Vascular Ultrasound   5th Floor: - [facility]  (general and EP) - Clinical Pharmacy for coumadin, hypertension, lipid, weight-loss medications, and med management appointments      Valet parking services will be available as well.

## 2023-12-21 NOTE — Progress Notes (Signed)
 Cardiology Office Note    Date:  12/26/2023   ID:  ISHMAEL BERKOVICH, DOB July 06, 1971, MRN [id number]  PCP:  Wilfred Curtis, MD  Cardiologist:  Nicki Guadalajara, MD   12 month F/U sleep evaluation initially referred by Pollyann Savoy, MD at [facility] following initiation of CPAP therapy.    History of Present Illness:  TIMOTH SCHARA is a 53 y.o. male who is followed at [facility] internal medicine and rsaw Dr. Georganna Skeans, IV on July 02, 2022.  He has significant cardiac history and remotely had seen Dr. Lady Gary and now sees Dr.Paraschos at the [facility] in [place].  He has documented chronic systolic heart failure and now [place] for sleep evaluation [place] than [place].  However, the patient has a history of prior MI and had been living in [place].  He has a history of severe LV dysfunction with heart failure in 2008 and apparently EF at that time was 20% which subsequently improved to greater than 55%.  He suffered a stroke on January 04, 2022 and subsequently had an MI complicated by ventricular tachycardia.  He underwent cardiac catheterization on January 27, 2022 which showed three-vessel obstructive disease with chronically occluded LAD, chronically occluded RCA, and 90% stenosis in the left circumflex vessel with collaterals to the distal LAD with EF 45 to 50%.  He was turned down for CABG revascularization due to a recent CVA and had undergone PCI on January 29, 2022 with DES stenting to his circumflex vessel.  He subsequently has had issues with acute on chronic heart failure.  He tells me his medications had changed after his stroke and he is no longer taking lisinopril or carvedilol but has been on amiodarone 200 mg daily, furosemide 20 mg which he takes 3 times per week, isosorbide 30 mg daily, and is on aspirin/ticagrelor following his stent.  He is on pantoprazole for GERD.  At my initial office visit, he was having issues  with sleep and was sleeping with supplemental oxygen  at 1 to 2 L.  He admitted to snoring, nocturia at least 2 times per night, awakening gasping for breath, as well as occasionally talks in his sleep.  He typically goes to bed between 11 PM and midnight and often wakes up at 9 AM.  He previously had had a sleep study at [facility] in April.  He apparently was told of having sleep apnea but had not yet seen a sleep physician and instead of being seen by the neurologist at [facility] he is now referred for evaluation.  An Epworth sleepiness score today calculated at 6 arguing against significant daytime sleepiness.  During my initial evaluation, I had a long discussion with him regarding sleep apnea and potential adverse cardiovascular consequences of left untreated.  I discussed effects on blood pressure, potential for nocturnal arrhythmias, increased risk for atrial fibrillation,, negative effects on glucose, GERD and inflammation as well as potential for nocturnal hypoxemia contributing to ischemia.  He underwent an echo Doppler study September 09, 2022 which showed low normal LV function with EF 50 to 55% with mild global hypokinesis.  His RV was mildly enlarged with moderate elevation of pulmonary artery systolic pressure.  There was biatrial enlargement, mild MR, mild to moderate TR, and moderately severe AR.  Results were sent to Dr. Darrold Junker his cardiologist in [place].  Since I saw him, he saw EP physician at [facility] in [place] Dr. Ladona Ridgel  and is now off amiodarone.  Mr. Carvalho received a new ResMed air curve 10 Vauto unit on October 21, 2022 with [employer] as his new DME company.  He has used therapy on a daily basis since January 12 with 100% use.  Average use on days used was 7 hours and 33 minutes.  His BiPAP is set at a minimum EPAP of 16, pressure support of 4, maximum IPAP of 25.  His 95th percentile pressure 21.6/17.6 with maximum average pressure 22.4/18.4.  AHI is 4.9.  He has noticed significant  benefit since initiating therapy with improved energy.  He is no longer snoring.  Previously he had nocturia 5 times per night and now this has been reduced to 1 time per evening.   Since I last saw him, he continues to be followed in [place] and sees Dr. Cassie Freer for cardiology care.  At times he notes very mild chest sensation particularly under stress or extreme exertion.  He continues to use CPAP therapy.  A download was obtained from January 22 through December 17, 2023.  Daily use is 100% with average use of 7 hours and 54 minutes.  His current BiPAP is set at a minimum EPAP pressure of 16 with maximum IPAP of 25 and pressure support at 4.  AHI is excellent at 0.2.  He does have significant mask leak.  His 95th percentile pressure is 19.7/15.7 with maximum average pressure 20/16.  He believes he is sleeping well.  He is unaware of any breakthrough snoring.  He denies residual daytime sleep events.  Past Medical History:  Diagnosis Date   CHF (congestive heart failure) (HCC)    Hypertension    Renal disorder     No past surgical history on file.  Current Medications: Outpatient Medications Prior to Visit  Medication Sig Dispense Refill   acetaminophen (TYLENOL) 325 MG tablet Take by mouth.     aspirin EC 81 MG tablet Take 81 mg by mouth daily.     atorvastatin (LIPITOR) 80 MG tablet Take 80 mg by mouth daily.     clonazePAM (KLONOPIN) 0.5 MG tablet Take 0.25 mg by mouth daily as needed.     cyanocobalamin (VITAMIN B12) 1000 MCG tablet Take 1,000 mcg by mouth daily.     ENTRESTO 24-26 MG Take 1 tablet by mouth 2 (two) times daily. Patient cuts tablet in half twice daily     isosorbide mononitrate (IMDUR) 30 MG 24 hr tablet Take 30 mg by mouth daily.     Lactobacillus Rhamnosus, GG, (CULTURELLE) CAPS Take 1 capsule by mouth daily.     metoprolol succinate (TOPROL-XL) 25 MG 24 hr tablet Take by mouth.     nystatin cream (MYCOSTATIN) Apply topically 2 (two) times daily.     pantoprazole  (PROTONIX) 40 MG tablet Take by mouth.     tamsulosin (FLOMAX) 0.4 MG CAPS capsule Take 0.4 mg by mouth daily.     ticagrelor (BRILINTA) 90 MG TABS tablet Take 1 tablet by mouth 2 (two) times daily.     amiodarone (PACERONE) 200 MG tablet Take 200 mg by mouth daily. (Patient not taking: Reported on 12/18/2022)     nitroGLYCERIN (NITROSTAT) 0.4 MG SL tablet Place under the tongue. (Patient not taking: Reported on 12/21/2023)     acetaminophen (TYLENOL) 325 MG tablet Take 325 mg by mouth every 4 (four) hours as needed. (Patient not taking: Reported on 12/21/2023)     clonazePAM (KLONOPIN) 0.5 MG tablet Take by mouth. (Patient not taking:  Reported on 12/21/2023)     diclofenac Sodium (VOLTAREN) 1 % GEL Apply 2 g topically as needed. (Patient not taking: Reported on 12/21/2023)     ferrous sulfate 325 (65 FE) MG tablet Take 325 mg by mouth once a week. (Patient not taking: Reported on 12/21/2023)     furosemide (LASIX) 20 MG tablet Take 20 mg by mouth daily. (Patient not taking: Reported on 12/21/2023)     pantoprazole (PROTONIX) 40 MG tablet Take 1 tablet by mouth daily. (Patient not taking: Reported on 12/21/2023)     No facility-administered medications prior to visit.     Allergies:   Shellfish allergy   Social History   Socioeconomic History   Marital status: Single    Spouse name: Not on file   Number of children: Not on file   Years of education: Not on file   Highest education level: Not on file  Occupational History   Not on file  Tobacco Use   Smoking status: Never   Smokeless tobacco: Never  Substance and Sexual Activity   Alcohol use: Never   Drug use: Never   Sexual activity: Not Currently    Partners: Female    Comment: single  Other Topics Concern   Not on file  Social History Narrative   Not on file   Social Drivers of Health   Financial Resource Strain: Low Risk  (11/14/2022)   Received from [facility], [facility]   Overall Financial Resource Strain (CARDIA)     Difficulty of Paying Living Expenses: Not hard at all  Food Insecurity: No Food Insecurity (11/14/2022)   Received from [facility], [facility]   Hunger Vital Sign    Worried About Running Out of Food in the Last Year: Never true    Ran Out of Food in the Last Year: Never true  Transportation Needs: No Transportation Needs (11/14/2022)   Received from [employer], [facility]   PRAPARE - Transportation    Lack of Transportation (Medical): No    Lack of Transportation (Non-Medical): No  Physical Activity: Not on file  Stress: No Stress Concern Present (02/18/2022)   Received from [facility], [facility] of Occupational Health - Occupational Stress Questionnaire    Feeling of Stress : Not at all  Social Connections: Unknown (02/23/2022)   Received from [facility], [facility]   Social Network    Social Network: Not on file      Family History:    ROS General: Negative; No fevers, chills, or night sweats;  HEENT: Negative; No changes in vision or hearing, sinus congestion, difficulty swallowing Pulmonary: Negative; No cough, wheezing, shortness of breath, hemoptysis Cardiovascular: See HPI GI: Negative; No nausea, vomiting, diarrhea, or abdominal pain GU: Negative; No dysuria, hematuria, or difficulty voiding Musculoskeletal: Negative; no myalgias, joint pain, or weakness Hematologic/Oncology: Negative; no easy bruising, bleeding Endocrine: Negative; no heat/cold intolerance; no diabetes Neuro: Negative; no changes in balance, headaches Skin: Negative; No rashes or skin lesions Psychiatric: Negative; No behavioral problems, depression Sleep: Positive for snoring snoring, apparently recently diagnosed sleep apnea; nodaytime sleepiness, hypersomnolence, bruxism, restless legs, hypnogognic hallucinations, no cataplexy Other comprehensive 14 point system review is negative.   PHYSICAL EXAM:   VS:  BP (!) 82/49   Pulse (!) 57   Ht 5\' 5"  (1.651  m)   Wt 144 lb 12.8 oz (65.7 kg)   SpO2 96%   BMI 24.10 kg/m     Repeat blood pressure by  me was low at 90/58  Wt Readings from Last 3 Encounters:  12/21/23 144 lb 12.8 oz (65.7 kg)  12/22/22 155 lb (70.3 kg)  12/18/22 155 lb (70.3 kg)   General: Alert, oriented, no distress.  Skin: normal turgor, no rashes, warm and dry HEENT: Normocephalic, atraumatic. Pupils equal round and reactive to light; sclera anicteric; extraocular muscles intact;  Nose without nasal septal hypertrophy Mouth/Parynx benign; Mallinpatti scale 4 Neck: No JVD, no carotid bruits; normal carotid upstroke Lungs: clear to ausculatation and percussion; no wheezing or rales Chest wall: without tenderness to palpitation Heart: PMI not displaced, RRR, s1 s2 normal, 1/6 systolic murmur, 2/6 AR diastolic murmur, no rubs, gallops, thrills, or heaves Abdomen: soft, nontender; no hepatosplenomehaly, BS+; abdominal aorta nontender and not dilated by palpation. Back: no CVA tenderness Pulses 2+ Musculoskeletal: full range of motion, normal strength, no joint deformities Extremities: no clubbing cyanosis or edema, Homan's sign negative  Neurologic: grossly nonfocal; Cranial nerves grossly wnl Psychologic: Normal mood and affect    Studies/Labs Reviewed:   EKG Interpretation Date/Time:  Monday December 21 2023 10:31:25 EST Ventricular Rate:  57 PR Interval:  162 QRS Duration:  92 QT Interval:  406 QTC Calculation: 395 R Axis:   -29  Text Interpretation: Sinus bradycardia Incomplete right bundle branch block When compared with ECG of 25-Apr-2020 00:41, PREVIOUS ECG IS PRESENT Confirmed by Nicki Guadalajara ([id number]) on 12/21/2023 11:47:19 AM    December 18, 2022 ECG (independently read by me): Sinus rhythm at 68, 1st degree AV block, Nonspecific T wave abnormality  August 15, 2022 ECG (independently read by me):  Sinus rhythm at 75, 1st degree AV block, LAHB, Increased QTc at 491 msec.  Recent Labs:    Latest Ref Rng  & Units 04/25/2020    4:35 AM 04/25/2020   12:39 AM  BMP  Glucose 70 - 99 mg/dL 80  784   BUN 6 - 20 mg/dL 28  38   Creatinine 6.96 - 1.24 mg/dL 2.95  2.84   Sodium 132 - 145 mmol/L 139  137   Potassium 3.5 - 5.1 mmol/L 3.6  3.9   Chloride 98 - 111 mmol/L 115  103   CO2 22 - 32 mmol/L 15  26   Calcium 8.9 - 10.3 mg/dL 6.5  8.8          No data to display             Latest Ref Rng & Units 04/25/2020   12:39 AM  CBC  WBC 4.0 - 10.5 K/uL 10.8   Hemoglobin 13.0 - 17.0 g/dL 44.0   Hematocrit 10.2 - 52.0 % 39.0   Platelets 150 - 400 K/uL 212    Lab Results  Component Value Date   MCV 89.7 04/25/2020   No results found for: "TSH" No results found for: "HGBA1C"   BNP No results found for: "BNP"  ProBNP No results found for: "PROBNP"   Lipid Panel  No results found for: "CHOL", "TRIG", "HDL", "CHOLHDL", "VLDL", "LDLCALC", "LDLDIRECT", "LABVLDL"   RADIOLOGY: No results found.   Additional studies/ records that were reviewed today include:   I have reviewed the records of [facility] internal medicine.  I also reviewed the records of Dr. Jamse Mead   ASSESSMENT:    1. Chronic combined systolic and diastolic congestive heart failure (HCC)   2. Ischemic cardiomyopathy   3. OSA (obstructive sleep apnea)   4. Nonrheumatic moderate aortic valve insufficiency   5. Aortic valve  sclerosis   6. 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 Cobie Marcoux is a 53 year old gentleman who has a history of CAD, myocardial infarction, VT, CVA,  LV dysfunction, and acute on chronic systolic and diastolic heart failure.  He is followed by Dr. Cassie Freer for cardiology care in [place].  When I saw him initially, he had undergone a sleep study previously which I did not have any records. He admitted to  snoring, nocturia at least 3 times per night, and awakening gasping for breath particularly when sleeping on his back.  He typically  goes to bed between 11 PM and midnight and wakes up at 9 AM.  His sleep is not restorative.  He is unaware of any bruxism, restless legs, hypnopompic or hypnagogic hallucinations or cataplectic events.  At my initial evaluation I had a lengthy discussion with him regarding sleep apnea and if left untreated potential adverse cardiovascular consequences.  I was also concerned with significant cardiac murmurs and a 2D echo Doppler study confirmed my suspicion with low normal LV function with EF 50 to 55% and mild global hypokinesis.  He had moderate elevation of pulmonary artery systolic pressure.  There was mild MR, mild to moderate TR, and moderately severe AR.  I recommended he follow-up with Dr. Cassie Freer who is his cardiologist.  He also was seen electrophysiology and his amiodarone was discontinued by Dr. Ladona Ridgel.  With reference to his sleep apnea, Mr. Grantham received a new ResMed air curve 10 via auto BiPAP unit on October 21, 2022.  He initiated therapy in January 2024 and was using treatment on a daily basis since November 07, 2022.  Since initiating daily therapy, he has noticed significant benefit.  He has more energy.  He denies residual fatigability.  He is no longer snoring.  Previous significant nocturia at times up to 5 times per night and is now once per evening.  His most recent download shows high pressure requirement with his 95th percentile pressure of 21.6/17.6 and maximum average pressure 22.4/18.4.  His current pressure set up is a minimum EPAP of 16, pressure support of 4 and maximum IPAP of 25.  He has been using a fullface mask.  An Epworth scale was recalculated in the office today and since BiPAP initiation there is no residual daytime sleepiness with his Epworth scale calculated at 1 with only slight chance of dozing while lying down to rest in the afternoon when circumstances persist.  Most recently, his download from January 22 to February thousand 25 shows excellent benefit with BiPAP  therapy with his pressure range set at a minimum EPAP of 16 with maximum IPAP of 25.  AHI 0.2.  On exam today, blood pressure is low.  He currently has been taking aspirin 81 mg, Entresto one half of a 24/26 mg pill twice a day, isosorbide 30 mg daily.  He is no longer on furosemide.  He continues to be on DAPT with aspirin/ticagrelor and is on pantoprazole for GERD.  I have recommended follow-up with Dr. Cassie Freer particularly with his moderately severe AR in addition to his mild to moderate TR.  TEE may be considered in the future.  I discussed with him my plans for retirement.  Following my retirement, he can see Dr. Mayford Knife for sleep issues unless he sees a provider in [place].      Medication Adjustments/Labs and Tests Ordered: Current medicines are reviewed at length with the patient today.  Concerns regarding medicines  are outlined above.  Medication changes, Labs and Tests ordered today are listed in the Patient Instructions below. Patient Instructions  Medication Instructions:  No medication changes were made during today's visit  *If you need a refill on your cardiac medications before your next appointment, please call your pharmacy*   Lab Work: No labs were ordered during today's visit.  If you have labs (blood work) drawn today and your tests are completely normal, you will receive your results only by: MyChart Message (if you have MyChart) OR A paper copy in the mail If you have any lab test that is abnormal or we need to change your treatment, we will call you to review the results.   Testing/Procedures: No procedures ordered today.    Follow-Up: At [facility], you and your health needs are our priority.  As part of our continuing mission to provide you with exceptional heart care, we have created designated Provider Care Teams.  These Care Teams include your primary Cardiologist (physician) and Advanced Practice Providers (APPs -  Physician Assistants and Nurse  Practitioners) who all work together to provide you with the care you need, when you need it.  We recommend signing up for the patient portal called "MyChart".  Sign up information is provided on this After Visit Summary.  MyChart is used to connect with patients for Virtual Visits (Telemedicine).  Patients are able to view lab/test results, encounter notes, upcoming appointments, etc.  Non-urgent messages can be sent to your provider as well.   To learn more about what you can do with MyChart, go to [email or web address].    Your next appointment:   1 year(s)  Provider:   DR. Armanda Magic  for sleep issues  Other Instructions        1st Floor: - Lobby - Registration  - Pharmacy  - Lab - Cafe   2nd Floor: - PV Lab - Diagnostic Testing (echo, CT, nuclear med)   3rd Floor: - Vacant   4th Floor: - TCTS (cardiothoracic surgery) - AFib Clinic - Structural Heart Clinic - Vascular Surgery  - Vascular Ultrasound   5th Floor: - HeartCare Cardiology (general and EP) - Clinical Pharmacy for coumadin, hypertension, lipid, weight-loss medications, and med management appointments      Valet parking services will be available as well.           Signed, Nicki Guadalajara, MD,FACC, ABSM American Board of Sleep Medicine  12/26/2023 10:26 AM    [facility] [address], [address], [place], [place]  [address] Phone: [phone]

## 2023-12-25 ENCOUNTER — Ambulatory Visit: Payer: 59 | Admitting: Occupational Therapy

## 2023-12-25 DIAGNOSIS — M6281 Muscle weakness (generalized): Secondary | ICD-10-CM | POA: Diagnosis not present

## 2023-12-25 DIAGNOSIS — R29898 Other symptoms and signs involving the musculoskeletal system: Secondary | ICD-10-CM

## 2023-12-25 DIAGNOSIS — R278 Other lack of coordination: Secondary | ICD-10-CM

## 2023-12-25 DIAGNOSIS — R29818 Other symptoms and signs involving the nervous system: Secondary | ICD-10-CM

## 2023-12-25 NOTE — Therapy (Signed)
 OUTPATIENT OCCUPATIONAL THERAPY NEURO TREATMENT  Patient Name: Christopher Morales MRN: [id number] DOB:May 31, 1971, 53 y.o., male Today's Date: 12/25/2023  PCP: Wilfred Curtis, MD  REFERRING PROVIDER: Wilfred Curtis, MD   END OF SESSION:  OT End of Session - 12/25/23 1254     Visit Number 5    Number of Visits 9    Date for OT Re-Evaluation 01/15/24    Authorization Type [employer]    Progress Note Due on Visit 9    OT Start Time 1108    OT Stop Time 1146    OT Time Calculation (min) 38 min    Activity Tolerance Patient tolerated treatment well    Behavior During Therapy WFL for tasks assessed/performed             Past Medical History:  Diagnosis Date   CHF (congestive heart failure) (HCC)    Hypertension    Renal disorder    No past surgical history on file. Patient Active Problem List   Diagnosis Date Noted   OSA (obstructive sleep apnea) 09/24/2022   Chronic combined systolic and diastolic congestive heart failure (HCC) 09/24/2022   Nocturnal hypoxia 09/24/2022    ONSET DATE: 10/27/2023 (Date of referral)  REFERRING DIAG: Z86.73 (ICD-10-CM) - Personal history of transient ischemic attack (TIA), and cerebral infarction without residual deficits   THERAPY DIAG:  Muscle weakness (generalized)  Other lack of coordination  Other symptoms and signs involving the musculoskeletal system  Other symptoms and signs involving the nervous system  Rationale for Evaluation and Treatment: Rehabilitation  SUBJECTIVE:   SUBJECTIVE STATEMENT: Pt reported only taking 1/2 pill of clonazepam medication, OT updated medication list accordingly.  Pt reported muscle tightness yesterday and questioned if overdoing exercises or d/t weather. Pt reported today is improved. Pt reported no worse pain than usual today.  Pt accompanied by: self  PERTINENT HISTORY: chronic systolic heart failure, hx of MI, heart failure, cardiac cath April 2023 and stent, HTN, renal disorder, CVA March 2023    Left upper extremity due to stroke  PRECAUTIONS: Fall  WEIGHT BEARING RESTRICTIONS: No  PAIN:  Are you having pain? Yes: NPRS scale: 3/10 Pain location: neck down Pain description: numb Aggravating factors: positioning Relieving factors: positioning  FALLS: Has patient fallen in last 6 months? No  LIVING ENVIRONMENT: Lives with: lives with their family stays at [facility] house overnight, stays at his house during the day Lives in: House/apartment Stairs: Yes: External: 3-4 steps; on right going up 3-4 at mom's house; 5 STE at his house with one handrail for both Has following equipment at home: Quad cane small base and Walker - 2 wheeled  PLOF: Independent with gait, Independent with transfers, and Requires assistive device for independence; is on disability from work but still waiting for government disability; was driving prior to stroke but not currently   PATIENT GOALS: Improve UE function  OBJECTIVE:  Note: Objective measures were completed at Evaluation unless otherwise noted.  HAND DOMINANCE: Right  ADLs: Overall ADLs: mod I  IADLs: Shopping: has groceries delivered Light housekeeping: swept floors and did yard work before stroke (currently hires out) Meal Prep: mod I with light meal prep; used to do more complex baking and cooking  Community mobility: dependent Medication management: setup due to Psychologist, prison and probation services management: mod I  MOBILITY STATUS: Independent with SPC  ACTIVITY TOLERANCE: Activity tolerance: good to fair  FUNCTIONAL OUTCOME MEASURES: Quick Dash: 45.5 % disability with use of LUE   UPPER EXTREMITY ROM:  Active ROM Right eval Left eval  Shoulder flexion WNL 80  Shoulder abduction WNL 70  Shoulder internal rotation WNL Just behind hip  Shoulder external rotation WNL To top of head  Elbow flexion WNL WNL  Elbow extension WNL WFL  Wrist flexion WNL WFL  Wrist extension WNL WFL  Wrist pronation WNL WFL  Wrist supination WNL  WFL  (Blank rows = not tested)  Composite flexion B WNL Digit opposition: R-WNL; L- impaired  UPPER EXTREMITY MMT:     MMT Right (eval) Left (eval)  Shoulder flexion WNL BFL  Shoulder abduction WNL BFL  Elbow flexion WNL WFL  Elbow extension WNL WNL  (Blank rows = not tested)  HAND FUNCTION: Grip strength: Right: 64 lbs; Left: 35 lbs  COORDINATION: 9 Hole Peg test: Right: 32 sec; Left: 59 sec  SENSATION: Paresthesias in B hands  EDEMA: no edema  MUSCLE TONE: LUE: Within functional limits  COGNITION: Overall cognitive status: Within functional limits for tasks assessed  VISION: Subjective report: no change since stroke Baseline vision:  slight left field cut even prior to stroke Visual history:  none  VISION ASSESSMENT: Not tested  PERCEPTION: WFL  PRAXIS: WFL  OBSERVATIONS: Pt utilizes LUE volitionally. Uses SPC for ambulation. No LOB.                                                                                                                            TREATMENT:   Pt politely declined to review FM coordination HEP today.  TherEx Flex bar - to build affected UE strength and coordination - 2 sets, 10 reps supination, pronation, twist - Pt benefited from extra time and min to mod v/c for BUE positioning, sequencing of motor movements, and decreasing speed.  TherAct Typing practice games "home keys" and 3-minute and 1-minute typing test (all keys) - to improve FM coordination and B integration for typing, to improve sequencing and pattern recognition and visual scanning. - Pt typed 16 words per minute with 84% accuracy in 3 minutes. Pt typed 18 words per minute with 97% accuracy in 1 minute at end of typing tasks. Pt initially benefited from OT reading letters aloud during typing games to reduce errors then fading assistance.  OT educated pt on typing resources online, keyboard options, importance of consistent typing practice to improve efficiency and reduce  errors. Handout provided, see pt instructions. Pt verbalized understanding.   PATIENT EDUCATION: Education details: see today's tx above Person educated: Patient Education method: Explanation, Demonstration, Verbal cues, and Handouts Education comprehension: verbalized understanding, returned demonstration, verbal cues required, and needs further education  HOME EXERCISE PROGRAM: 12/04/2023: coordination HEP 12/25/23 - typing  GOALS:  SHORT TERM GOALS: Target date: 12/11/2023    Patient will demonstrate independence with initial LUE HEP. Baseline: Has HEP from 2024 but will require review Goal status: INITIAL  2.  Patient will demo improved FM coordination as evidenced by completing nine-hole peg with use of L in 48  seconds or less. Baseline: 59 seconds Goal status: INITIAL  3.  Patient will demonstrate at least 40 lbs L grip strength as needed to open jars and other containers. Baseline: Right: 64 lbs; Left: 35 lbs Goal status: INITIAL  LONG TERM GOALS: Target date: 01/15/2024  Patient will demonstrate updated LUE HEP with 25% verbal cues or less for proper execution. Baseline: not yet initiated Goal status: INITIAL  2.  Pt will be able to type whole sermon using BUE equally.  Baseline: unable Goal status: in progress  3.  Patient will demonstrate at least 16% improvement with quick Dash score (reporting 29.5% disability or less) indicating improved functional use of affected extremity. Baseline: 45.5% disability with use of LUE Goal status: INITIAL  ASSESSMENT:  CLINICAL IMPRESSION: Pt tolerated tasks well, demo'ing improved efficiency with typing by end of typing task. Pt continues to benefit from increased time and cueing intermittently throughout session for proper completion of activities.    PERFORMANCE DEFICITS: in functional skills including ADLs, IADLs, coordination, dexterity, sensation, ROM, strength, pain, Fine motor control, Gross motor control, and UE  functional use.   IMPAIRMENTS: are limiting patient from ADLs, IADLs, work, leisure, and social participation.   CO-MORBIDITIES: may have co-morbidities  that affects occupational performance. Patient will benefit from skilled OT to address above impairments and improve overall function.  REHAB POTENTIAL: Good for goals stated  PLAN:  OT FREQUENCY: 1x/week  OT DURATION: 8 weeks  PLANNED INTERVENTIONS: 97168 OT Re-evaluation, 97535 self care/ADL training, 40981 therapeutic exercise, 97530 therapeutic activity, 97112 neuromuscular re-education, 97140 manual therapy, 97010 moist heat, 97032 electrical stimulation (manual), passive range of motion, functional mobility training, patient/family education, and DME and/or AE instructions  RECOMMENDED OTHER SERVICES: N/A for this visit  CONSULTED AND AGREED WITH PLAN OF CARE: Patient  PLAN FOR NEXT SESSION: review Coordination HEP PRN; practice functional tasks with incorporation of LUE, did pt try typing tasks at home?   Wynetta Emery, OT 12/25/2023, 12:59 PM

## 2023-12-26 ENCOUNTER — Encounter: Payer: Self-pay | Admitting: Cardiovascular Disease

## 2024-01-01 ENCOUNTER — Ambulatory Visit: Payer: 59 | Attending: Internal Medicine | Admitting: Occupational Therapy

## 2024-01-01 DIAGNOSIS — M25512 Pain in left shoulder: Secondary | ICD-10-CM | POA: Insufficient documentation

## 2024-01-01 DIAGNOSIS — M6281 Muscle weakness (generalized): Secondary | ICD-10-CM | POA: Diagnosis present

## 2024-01-01 DIAGNOSIS — G8929 Other chronic pain: Secondary | ICD-10-CM | POA: Insufficient documentation

## 2024-01-01 DIAGNOSIS — R208 Other disturbances of skin sensation: Secondary | ICD-10-CM | POA: Diagnosis present

## 2024-01-01 DIAGNOSIS — R29818 Other symptoms and signs involving the nervous system: Secondary | ICD-10-CM | POA: Insufficient documentation

## 2024-01-01 DIAGNOSIS — R29898 Other symptoms and signs involving the musculoskeletal system: Secondary | ICD-10-CM | POA: Diagnosis present

## 2024-01-01 DIAGNOSIS — M25612 Stiffness of left shoulder, not elsewhere classified: Secondary | ICD-10-CM | POA: Diagnosis present

## 2024-01-01 DIAGNOSIS — R278 Other lack of coordination: Secondary | ICD-10-CM | POA: Insufficient documentation

## 2024-01-01 NOTE — Therapy (Signed)
 OUTPATIENT OCCUPATIONAL THERAPY NEURO TREATMENT  Patient Name: Christopher Morales MRN: [id number] DOB:1971/04/11, 53 y.o., male Today's Date: 01/01/2024  PCP: Wilfred Curtis, MD  REFERRING PROVIDER: Wilfred Curtis, MD   END OF SESSION:  OT End of Session - 01/01/24 1057     Visit Number 6    Number of Visits 9    Date for OT Re-Evaluation 01/15/24    Authorization Type [employer]    Progress Note Due on Visit 9    OT Start Time 1105    OT Stop Time 1147    OT Time Calculation (min) 42 min    Activity Tolerance Patient tolerated treatment well    Behavior During Therapy WFL for tasks assessed/performed             Past Medical History:  Diagnosis Date   CHF (congestive heart failure) (HCC)    Hypertension    Renal disorder    No past surgical history on file. Patient Active Problem List   Diagnosis Date Noted   OSA (obstructive sleep apnea) 09/24/2022   Chronic combined systolic and diastolic congestive heart failure (HCC) 09/24/2022   Nocturnal hypoxia 09/24/2022    ONSET DATE: 10/27/2023 (Date of referral)  REFERRING DIAG: Z86.73 (ICD-10-CM) - Personal history of transient ischemic attack (TIA), and cerebral infarction without residual deficits   THERAPY DIAG:  Muscle weakness (generalized)  Other lack of coordination  Other symptoms and signs involving the musculoskeletal system  Other symptoms and signs involving the nervous system  Other disturbances of skin sensation  Rationale for Evaluation and Treatment: Rehabilitation  SUBJECTIVE:   SUBJECTIVE STATEMENT: Pt reported he took his driving assessment and was cleared to drive but the DMV stated he has to get glasses before he can renew his license.   Pt reports he has too many exercises but requests shoulder strengthening exercises.   Pt accompanied by: self  PERTINENT HISTORY: chronic systolic heart failure, hx of MI, heart failure, cardiac cath April 2023 and stent, HTN, renal disorder, CVA March 2023    Left upper extremity due to stroke  PRECAUTIONS: Fall  WEIGHT BEARING RESTRICTIONS: No  PAIN:  Are you having pain? Yes: NPRS scale: 3/10 Pain location: neck down Pain description: numb Aggravating factors: positioning Relieving factors: positioning  FALLS: Has patient fallen in last 6 months? No  LIVING ENVIRONMENT: Lives with: lives with their family stays at [facility] house overnight, stays at his house during the day Lives in: House/apartment Stairs: Yes: External: 3-4 steps; on right going up 3-4 at mom's house; 5 STE at his house with one handrail for both Has following equipment at home: Quad cane small base and Walker - 2 wheeled  PLOF: Independent with gait, Independent with transfers, and Requires assistive device for independence; is on disability from work but still waiting for government disability; was driving prior to stroke but not currently   PATIENT GOALS: Improve UE function  OBJECTIVE:  Note: Objective measures were completed at Evaluation unless otherwise noted.  HAND DOMINANCE: Right  ADLs: Overall ADLs: mod I  IADLs: Shopping: has groceries delivered Light housekeeping: swept floors and did yard work before stroke (currently hires out) Meal Prep: mod I with light meal prep; used to do more complex baking and cooking  Community mobility: dependent Medication management: setup due to Psychologist, prison and probation services management: mod I  MOBILITY STATUS: Independent with SPC  ACTIVITY TOLERANCE: Activity tolerance: good to fair  FUNCTIONAL OUTCOME MEASURES: Quick Dash: 45.5 % disability with use of  LUE   UPPER EXTREMITY ROM:    Active ROM Right eval Left eval  Shoulder flexion WNL 80  Shoulder abduction WNL 70  Shoulder internal rotation WNL Just behind hip  Shoulder external rotation WNL To top of head  Elbow flexion WNL WNL  Elbow extension WNL WFL  Wrist flexion WNL WFL  Wrist extension WNL WFL  Wrist pronation WNL WFL  Wrist supination WNL  WFL  (Blank rows = not tested)  Composite flexion B WNL Digit opposition: R-WNL; L- impaired  UPPER EXTREMITY MMT:     MMT Right (eval) Left (eval)  Shoulder flexion WNL BFL  Shoulder abduction WNL BFL  Elbow flexion WNL WFL  Elbow extension WNL WNL  (Blank rows = not tested)  HAND FUNCTION: Grip strength: Right: 64 lbs; Left: 35 lbs  COORDINATION: 9 Hole Peg test: Right: 32 sec; Left: 59 sec  SENSATION: Paresthesias in B hands  EDEMA: no edema  MUSCLE TONE: LUE: Within functional limits  COGNITION: Overall cognitive status: Within functional limits for tasks assessed  VISION: Subjective report: no change since stroke Baseline vision:  slight left field cut even prior to stroke Visual history:  none  VISION ASSESSMENT: Not tested  PERCEPTION: WFL  PRAXIS: WFL  OBSERVATIONS: Pt utilizes LUE volitionally. Uses SPC for ambulation. No LOB.                                                                                                                            TREATMENT:   OT reviewed table top play of Golf Solitaire for LUE ROM, coordination, visual processing, scanning, and sequencing. Pt required min cues for proper play.   OT reviewed [email or web address] recommendations specifically discussing games to improve LUE coordination. OT provided pt with left handed typing words as noted in pt instructions.    OT initiated exercise chart as noted in pt instructions. Pt encouraged to make sure he is doing each exercise at least once a week.   PATIENT EDUCATION: Education details: see today's tx above Person educated: Patient Education method: Explanation, Demonstration, Verbal cues, and Handouts Education comprehension: verbalized understanding, returned demonstration, verbal cues required, and needs further education  HOME EXERCISE PROGRAM: 12/04/2023: coordination HEP 12/25/23 - typing 01/01/2024: Left handed words; exercise chart  GOALS:  SHORT TERM GOALS: Target  date: 12/11/2023    Patient will demonstrate independence with initial LUE HEP. Baseline: Has HEP from 2024 but will require review Goal status: MET  2.  Patient will demo improved FM coordination as evidenced by completing nine-hole peg with use of L in 48 seconds or less. Baseline: 59 seconds Goal status: INITIAL  3.  Patient will demonstrate at least 40 lbs L grip strength as needed to open jars and other containers. Baseline: Right: 64 lbs; Left: 35 lbs Goal status: INITIAL  LONG TERM GOALS: Target date: 01/15/2024  Patient will demonstrate updated LUE HEP with 25% verbal cues or less for proper execution. Baseline: not yet initiated Goal  status: INITIAL  2.  Pt will be able to type whole sermon using BUE equally.  Baseline: unable Goal status: in progress  3.  Patient will demonstrate at least 16% improvement with quick Dash score (reporting 29.5% disability or less) indicating improved functional use of affected extremity. Baseline: 45.5% disability with use of LUE Goal status: INITIAL  ASSESSMENT:  CLINICAL IMPRESSION: Pt verbalizes understanding of HEP as needed to progress towards goals. He will require review to improve carryover. Will monitor appropriateness of shoulder strengthening HEP during future visits as requested by pt.    PERFORMANCE DEFICITS: in functional skills including ADLs, IADLs, coordination, dexterity, sensation, ROM, strength, pain, Fine motor control, Gross motor control, and UE functional use.   IMPAIRMENTS: are limiting patient from ADLs, IADLs, work, leisure, and social participation.   CO-MORBIDITIES: may have co-morbidities  that affects occupational performance. Patient will benefit from skilled OT to address above impairments and improve overall function.  REHAB POTENTIAL: Good for goals stated  PLAN:  OT FREQUENCY: 1x/week  OT DURATION: 8 weeks  PLANNED INTERVENTIONS: 97168 OT Re-evaluation, 97535 self care/ADL training, 91478  therapeutic exercise, 97530 therapeutic activity, 97112 neuromuscular re-education, 97140 manual therapy, 97010 moist heat, 97032 electrical stimulation (manual), passive range of motion, functional mobility training, patient/family education, and DME and/or AE instructions  RECOMMENDED OTHER SERVICES: N/A for this visit  CONSULTED AND AGREED WITH PLAN OF CARE: Patient  PLAN FOR NEXT SESSION: practice functional tasks with incorporation of LUE  Weighted HEP; flexbar HEP   Delana Meyer, OT 01/01/2024, 1:36 PM

## 2024-01-01 NOTE — Patient Instructions (Addendum)
  Typing:  Left-handed Words wear were are bear bare care dear deer stare react dare  sad gear great rate  date red read fear free tea tear fast waste treat tease zest wax cast vase vast cat basket earn casket bee seat ate gas rare seed tax fax feed cart art tart taste ear here  (Exercise) Monday Tuesday Wednesday Thursday Friday Saturday Sunday  Typing         Dowel         Coordination         Shoulder Stretches         Cards         Weighted Exercises         Getting up from Floor         Leg stretches         Hip stretches         Walking         Band exercises         Marching exercises         Putty         [employer]

## 2024-01-08 ENCOUNTER — Ambulatory Visit: Payer: 59 | Admitting: Occupational Therapy

## 2024-01-08 DIAGNOSIS — M6281 Muscle weakness (generalized): Secondary | ICD-10-CM | POA: Diagnosis not present

## 2024-01-08 DIAGNOSIS — R29818 Other symptoms and signs involving the nervous system: Secondary | ICD-10-CM

## 2024-01-08 DIAGNOSIS — G8929 Other chronic pain: Secondary | ICD-10-CM

## 2024-01-08 DIAGNOSIS — R208 Other disturbances of skin sensation: Secondary | ICD-10-CM

## 2024-01-08 DIAGNOSIS — R29898 Other symptoms and signs involving the musculoskeletal system: Secondary | ICD-10-CM

## 2024-01-08 DIAGNOSIS — M25612 Stiffness of left shoulder, not elsewhere classified: Secondary | ICD-10-CM

## 2024-01-08 DIAGNOSIS — R278 Other lack of coordination: Secondary | ICD-10-CM

## 2024-01-08 NOTE — Therapy (Signed)
 OUTPATIENT OCCUPATIONAL THERAPY NEURO TREATMENT  Patient Name: Christopher Morales MRN: [id number] DOB:03-18-1971, 53 y.o., male Today's Date: 01/08/2024  PCP: Wilfred Curtis, MD  REFERRING PROVIDER: Wilfred Curtis, MD   END OF SESSION:  OT End of Session - 01/08/24 1056     Visit Number 7    Number of Visits 9    Date for OT Re-Evaluation 01/15/24    Authorization Type [employer]    Progress Note Due on Visit 9    Activity Tolerance Patient tolerated treatment well    Behavior During Therapy [facility] for tasks assessed/performed             Past Medical History:  Diagnosis Date   CHF (congestive heart failure) (HCC)    Hypertension    Renal disorder    No past surgical history on file. Patient Active Problem List   Diagnosis Date Noted   OSA (obstructive sleep apnea) 09/24/2022   Chronic combined systolic and diastolic congestive heart failure (HCC) 09/24/2022   Nocturnal hypoxia 09/24/2022    ONSET DATE: 10/27/2023 (Date of referral)  REFERRING DIAG: Z86.73 (ICD-10-CM) - Personal history of transient ischemic attack (TIA), and cerebral infarction without residual deficits   THERAPY DIAG:  No diagnosis found.  Rationale for Evaluation and Treatment: Rehabilitation  SUBJECTIVE:   SUBJECTIVE STATEMENT: Pt questions if he would do more harm than good going without his cane for ambulation. He requested inquiry with PT who advised going without cane. Pt still uncertain what he will do.   Pt accompanied by: self  PERTINENT HISTORY: chronic systolic heart failure, hx of MI, heart failure, cardiac cath April 2023 and stent, HTN, renal disorder, CVA March 2023   Left upper extremity due to stroke  PRECAUTIONS: Fall  WEIGHT BEARING RESTRICTIONS: No  PAIN:  Are you having pain? Yes: NPRS scale: 3/10 Pain location: neck down Pain description: numb Aggravating factors: positioning Relieving factors: positioning  FALLS: Has patient fallen in last 6 months? No  LIVING  ENVIRONMENT: Lives with: lives with their family stays at [facility] house overnight, stays at his house during the day Lives in: House/apartment Stairs: Yes: External: 3-4 steps; on right going up 3-4 at mom's house; 5 STE at his house with one handrail for both Has following equipment at home: Quad cane small base and Walker - 2 wheeled  PLOF: Independent with gait, Independent with transfers, and Requires assistive device for independence; is on disability from work but still waiting for government disability; was driving prior to stroke but not currently   PATIENT GOALS: Improve UE function  OBJECTIVE:  Note: Objective measures were completed at Evaluation unless otherwise noted.  HAND DOMINANCE: Right  ADLs: Overall ADLs: mod I  IADLs: Shopping: has groceries delivered Light housekeeping: swept floors and did yard work before stroke (currently hires out) Meal Prep: mod I with light meal prep; used to do more complex baking and cooking  Community mobility: dependent Medication management: setup due to Secretary/administrator: mod I  MOBILITY STATUS: Independent with SPC  ACTIVITY TOLERANCE: Activity tolerance: good to fair  FUNCTIONAL OUTCOME MEASURES: Quick Dash: 45.5 % disability with use of LUE   UPPER EXTREMITY ROM:    Active ROM Right eval Left eval  Shoulder flexion WNL 80  Shoulder abduction WNL 70  Shoulder internal rotation WNL Just behind hip  Shoulder external rotation WNL To top of head  Elbow flexion WNL WNL  Elbow extension WNL WFL  Wrist flexion WNL WFL  Wrist extension  WNL WFL  Wrist pronation WNL WFL  Wrist supination WNL WFL  (Blank rows = not tested)  Composite flexion B WNL Digit opposition: R-WNL; L- impaired  UPPER EXTREMITY MMT:     MMT Right (eval) Left (eval)  Shoulder flexion WNL BFL  Shoulder abduction WNL BFL  Elbow flexion WNL WFL  Elbow extension WNL WNL  (Blank rows = not tested)  HAND FUNCTION: Grip strength:  Right: 64 lbs; Left: 35 lbs  COORDINATION: 9 Hole Peg test: Right: 32 sec; Left: 59 sec  SENSATION: Paresthesias in B hands  EDEMA: no edema  MUSCLE TONE: LUE: Within functional limits  COGNITION: Overall cognitive status: Within functional limits for tasks assessed  VISION: Subjective report: no change since stroke Baseline vision:  slight left field cut even prior to stroke Visual history:  none  VISION ASSESSMENT: Not tested  PERCEPTION: WFL  PRAXIS: WFL  OBSERVATIONS: Pt utilizes LUE volitionally. Uses SPC for ambulation. No LOB.                                                                                                                            TREATMENT:   OT initiated BUE strengthening HEP using 2 lb weight for RUE and mostly 1 lb weight for LUE with exception to bicep curls.  Pt required max multimodal cueing for proper execution.   PATIENT EDUCATION: Education details: see today's tx above Person educated: Patient Education method: Explanation, Demonstration, Verbal cues, and Handouts Education comprehension: verbalized understanding, returned demonstration, verbal cues required, and needs further education  HOME EXERCISE PROGRAM: 12/04/2023: coordination HEP 12/25/23 - typing 01/01/2024: Left handed words; exercise chart 01/08/2024: BUE strengthening HEP  Access Code: [email or web address]  GOALS:  SHORT TERM GOALS: Target date: 12/11/2023    Patient will demonstrate independence with initial LUE HEP. Baseline: Has HEP from 2024 but will require review Goal status: MET  2.  Patient will demo improved FM coordination as evidenced by completing nine-hole peg with use of L in 48 seconds or less. Baseline: 59 seconds Goal status: INITIAL  3.  Patient will demonstrate at least 40 lbs L grip strength as needed to open jars and other containers. Baseline: Right: 64 lbs; Left: 35 lbs Goal status: INITIAL  LONG TERM GOALS: Target date: 01/15/2024  Patient will  demonstrate updated LUE HEP with 25% verbal cues or less for proper execution. Baseline: not yet initiated Goal status: INITIAL  2.  Pt will be able to type whole sermon using BUE equally.  Baseline: unable Goal status: in progress  3.  Patient will demonstrate at least 16% improvement with quick Dash score (reporting 29.5% disability or less) indicating improved functional use of affected extremity. Baseline: 45.5% disability with use of LUE Goal status: INITIAL  ASSESSMENT:  CLINICAL IMPRESSION: Pt demonstrates understanding of HEP as needed to progress towards goals. He will require review to improve carryover. Due to chronicity of symptoms and current HEP, pt will most likely be ready for  d/c at next visit.   PERFORMANCE DEFICITS: in functional skills including ADLs, IADLs, coordination, dexterity, sensation, ROM, strength, pain, Fine motor control, Gross motor control, and UE functional use.   IMPAIRMENTS: are limiting patient from ADLs, IADLs, work, leisure, and social participation.   CO-MORBIDITIES: may have co-morbidities  that affects occupational performance. Patient will benefit from skilled OT to address above impairments and improve overall function.  REHAB POTENTIAL: Good for goals stated  PLAN:  OT FREQUENCY: 1x/week  OT DURATION: 8 weeks  PLANNED INTERVENTIONS: 97168 OT Re-evaluation, 97535 self care/ADL training, 09811 therapeutic exercise, 97530 therapeutic activity, 97112 neuromuscular re-education, 97140 manual therapy, 97010 moist heat, 97032 electrical stimulation (manual), passive range of motion, functional mobility training, patient/family education, and DME and/or AE instructions  RECOMMENDED OTHER SERVICES: N/A for this visit  CONSULTED AND AGREED WITH PLAN OF CARE: Patient  PLAN FOR NEXT SESSION: assess goals; D/C  Weighted HEP; flexbar HEP   Delana Meyer, OT 01/08/2024, 11:00 AM

## 2024-01-10 NOTE — Patient Instructions (Addendum)
   [email or web address]  Access Code: [email or web address]

## 2024-01-13 ENCOUNTER — Ambulatory Visit: Payer: 59 | Admitting: Cardiovascular Disease

## 2024-01-15 ENCOUNTER — Ambulatory Visit: Payer: 59 | Admitting: Occupational Therapy

## 2024-01-15 DIAGNOSIS — M25612 Stiffness of left shoulder, not elsewhere classified: Secondary | ICD-10-CM

## 2024-01-15 DIAGNOSIS — R278 Other lack of coordination: Secondary | ICD-10-CM

## 2024-01-15 DIAGNOSIS — M6281 Muscle weakness (generalized): Secondary | ICD-10-CM | POA: Diagnosis not present

## 2024-01-15 DIAGNOSIS — R29818 Other symptoms and signs involving the nervous system: Secondary | ICD-10-CM

## 2024-01-15 DIAGNOSIS — R208 Other disturbances of skin sensation: Secondary | ICD-10-CM

## 2024-01-15 DIAGNOSIS — G8929 Other chronic pain: Secondary | ICD-10-CM

## 2024-01-15 DIAGNOSIS — R29898 Other symptoms and signs involving the musculoskeletal system: Secondary | ICD-10-CM

## 2024-01-15 NOTE — Therapy (Signed)
 OUTPATIENT OCCUPATIONAL THERAPY NEURO TREATMENT AND DISCHARGE  Patient Name: Christopher Morales MRN: [id number] DOB:[birth year], 53 y.o., male Today's Date: 01/15/2024  OCCUPATIONAL THERAPY DISCHARGE SUMMARY  Visits from Start of Care: 8  Current functional level related to goals / functional outcomes: Patient has met 1/3 short-term goals and 0/3 long-term goals to date.   Remaining deficits: Limitations with LUE functional use    Education / Equipment: Continue with HEP following OT d/c as needed to maintain current functional status and encourage further remediation efforts as able.    Patient agrees to discharge. Patient goals were partially met. Patient is being discharged due to maximized rehab potential. .  PCP: Wilfred Curtis, MD  REFERRING PROVIDER: Wilfred Curtis, MD   END OF SESSION:  OT End of Session - 01/15/24 1107     Visit Number 8    Number of Visits 9    Date for OT Re-Evaluation 01/15/24    Authorization Type [employer]    Progress Note Due on Visit 9    OT Start Time 1106    OT Stop Time 1152    OT Time Calculation (min) 46 min    Activity Tolerance Patient tolerated treatment well    Behavior During Therapy WFL for tasks assessed/performed             Past Medical History:  Diagnosis Date   CHF (congestive heart failure) (HCC)    Hypertension    Renal disorder    No past surgical history on file. Patient Active Problem List   Diagnosis Date Noted   OSA (obstructive sleep apnea) 09/24/2022   Chronic combined systolic and diastolic congestive heart failure (HCC) 09/24/2022   Nocturnal hypoxia 09/24/2022    ONSET DATE: 10/27/2023 (Date of referral)  REFERRING DIAG: Z86.73 (ICD-10-CM) - Personal history of transient ischemic attack (TIA), and cerebral infarction without residual deficits   THERAPY DIAG:  Muscle weakness (generalized)  Other lack of coordination  Other symptoms and signs involving the musculoskeletal system  Other symptoms and  signs involving the nervous system  Other disturbances of skin sensation  Chronic left shoulder pain  Stiffness of left shoulder, not elsewhere classified  Rationale for Evaluation and Treatment: Rehabilitation  SUBJECTIVE:   SUBJECTIVE STATEMENT: Pt questions if he would do more harm than good going without his cane for ambulation. He requested inquiry with PT who advised going without cane. Pt still uncertain what he will do.   Pt accompanied by: self  PERTINENT HISTORY: chronic systolic heart failure, hx of MI, heart failure, cardiac cath April 2023 and stent, HTN, renal disorder, CVA March 2023   Left upper extremity due to stroke  PRECAUTIONS: Fall  WEIGHT BEARING RESTRICTIONS: No  PAIN:  Are you having pain? Yes: NPRS scale: 3/10 Pain location: neck down Pain description: numb Aggravating factors: positioning Relieving factors: positioning  FALLS: Has patient fallen in last 6 months? No  LIVING ENVIRONMENT: Lives with: lives with their family stays at [facility] house overnight, stays at his house during the day Lives in: House/apartment Stairs: Yes: External: 3-4 steps; on right going up 3-4 at mom's house; 5 STE at his house with one handrail for both Has following equipment at home: Quad cane small base and Walker - 2 wheeled  PLOF: Independent with gait, Independent with transfers, and Requires assistive device for independence; is on disability from work but still waiting for government disability; was driving prior to stroke but not currently   PATIENT GOALS: Improve UE function  OBJECTIVE:  Note: Objective measures were completed at Evaluation unless otherwise noted.  HAND DOMINANCE: Right  ADLs: Overall ADLs: mod I  IADLs: Shopping: has groceries delivered Light housekeeping: swept floors and did yard work before stroke (currently hires out) Meal Prep: mod I with light meal prep; used to do more complex baking and cooking  Community mobility:  dependent Medication management: setup due to Secretary/administrator: mod I  MOBILITY STATUS: Independent with SPC  ACTIVITY TOLERANCE: Activity tolerance: good to fair  FUNCTIONAL OUTCOME MEASURES: Quick Dash: 45.5 % disability with use of LUE  01/15/2024 - no change  UPPER EXTREMITY ROM:    Active ROM Right eval Left eval  Shoulder flexion WNL 80  Shoulder abduction WNL 70  Shoulder internal rotation WNL Just behind hip  Shoulder external rotation WNL To top of head  Elbow flexion WNL WNL  Elbow extension WNL WFL  Wrist flexion WNL WFL  Wrist extension WNL WFL  Wrist pronation WNL WFL  Wrist supination WNL WFL  (Blank rows = not tested)  Composite flexion B WNL Digit opposition: R-WNL; L- impaired  UPPER EXTREMITY MMT:     MMT Right (eval) Left (eval)  Shoulder flexion WNL BFL  Shoulder abduction WNL BFL  Elbow flexion WNL WFL  Elbow extension WNL WNL  (Blank rows = not tested)  HAND FUNCTION: Grip strength: Right: 64 lbs; Left: 35 lbs  COORDINATION: 9 Hole Peg test: Right: 32 sec; Left: 59 sec  SENSATION: Paresthesias in B hands  EDEMA: no edema  MUSCLE TONE: LUE: Within functional limits  COGNITION: Overall cognitive status: Within functional limits for tasks assessed  VISION: Subjective report: no change since stroke Baseline vision:  slight left field cut even prior to stroke Visual history:  none  VISION ASSESSMENT: Not tested  PERCEPTION: WFL  PRAXIS: WFL  OBSERVATIONS: Pt utilizes LUE volitionally. Uses SPC for ambulation. No LOB.                                                                                                                            TREATMENT:   OT reviewed BUE strengthening HEP using 2 lb weight for RUE and mostly 1 lb weight for LUE with exception to bicep curls.  Pt required moderate multimodal cueing for proper execution.   Objective measures assessed as noted in Goals section to determine  progression towards goals. Therapist reviewed goals with patient and updated patient progression.  No additional functional limitations identified.  PATIENT EDUCATION: Education details: see today's tx above Person educated: Patient Education method: Explanation, Demonstration, Tactile cues, and Verbal cues Education comprehension: verbalized understanding, returned demonstration, verbal cues required, and tactile cues required  HOME EXERCISE PROGRAM: 12/04/2023: coordination HEP 12/25/23 - typing 01/01/2024: Left handed words; exercise chart 01/08/2024: BUE strengthening HEP  Access Code: [email or web address]  GOALS:  SHORT TERM GOALS: Target date: 12/11/2023    Patient will demonstrate independence with initial LUE HEP. Baseline: Has HEP from 2024 but will  require review Goal status: MET  2.  Patient will demo improved FM coordination as evidenced by completing nine-hole peg with use of L in 48 seconds or less. Baseline: 59 seconds Goal status: NOT ASSESSED  3.  Patient will demonstrate at least 40 lbs L grip strength as needed to open jars and other containers. Baseline: Right: 64 lbs; Left: 35 lbs 01/15/2024: 35 Goal status: NOT MET  LONG TERM GOALS: Target date: 01/15/2024  Patient will demonstrate updated LUE HEP with 25% verbal cues or less for proper execution. Baseline: not yet initiated Goal status: NOT FULLY MET  2.  Pt will be able to type whole sermon using BUE equally.  Baseline: unable 01/15/2024: reports typing is not equal but better; has not tried to type a sermon Goal status: NOT MET  3.  Patient will demonstrate at least 16% improvement with quick Dash score (reporting 29.5% disability or less) indicating improved functional use of affected extremity. Baseline: 45.5% disability with use of LUE Goal status: NOT MET  ASSESSMENT:  CLINICAL IMPRESSION: Patient is appropriate for discharge and no longer demonstrates medical necessity for continued skilled occupational  therapy services. Limited change in functional status likely at least in part due to chronicity of stroke. Despite this, pt demonstrates better understanding of UE HEP as needed to maintain functional status.   PERFORMANCE DEFICITS: in functional skills including ADLs, IADLs, coordination, dexterity, sensation, ROM, strength, pain, Fine motor control, Gross motor control, and UE functional use.   IMPAIRMENTS: are limiting patient from ADLs, IADLs, work, leisure, and social participation.   CO-MORBIDITIES: may have co-morbidities  that affects occupational performance. Patient will benefit from skilled OT to address above impairments and improve overall function.  REHAB POTENTIAL: Good for goals stated  PLAN:  OT D/C Completed   Delana Meyer, OT 01/15/2024, 1:44 PM

## 2024-12-23 ENCOUNTER — Ambulatory Visit: Admitting: Cardiology
